# Patient Record
Sex: Female | Born: 1950
Health system: Southern US, Community
[De-identification: ages and names within clinical notes are randomized; demographics above are authoritative.]

## PROBLEM LIST (undated history)

## (undated) DIAGNOSIS — Z8041 Family history of malignant neoplasm of ovary: Secondary | ICD-10-CM

## (undated) DIAGNOSIS — C801 Malignant (primary) neoplasm, unspecified: Secondary | ICD-10-CM

## (undated) DIAGNOSIS — C439 Malignant melanoma of skin, unspecified: Secondary | ICD-10-CM

## (undated) DIAGNOSIS — C4490 Unspecified malignant neoplasm of skin, unspecified: Secondary | ICD-10-CM

## (undated) DIAGNOSIS — J45909 Unspecified asthma, uncomplicated: Secondary | ICD-10-CM

## (undated) DIAGNOSIS — J9819 Other pulmonary collapse: Secondary | ICD-10-CM

## (undated) DIAGNOSIS — M1990 Unspecified osteoarthritis, unspecified site: Secondary | ICD-10-CM

## (undated) DIAGNOSIS — Z808 Family history of malignant neoplasm of other organs or systems: Secondary | ICD-10-CM

## (undated) DIAGNOSIS — R112 Nausea with vomiting, unspecified: Secondary | ICD-10-CM

## (undated) DIAGNOSIS — Z85820 Personal history of malignant melanoma of skin: Secondary | ICD-10-CM

## (undated) DIAGNOSIS — Z803 Family history of malignant neoplasm of breast: Secondary | ICD-10-CM

## (undated) DIAGNOSIS — Z9889 Other specified postprocedural states: Secondary | ICD-10-CM

## (undated) DIAGNOSIS — C50919 Malignant neoplasm of unspecified site of unspecified female breast: Secondary | ICD-10-CM

## (undated) DIAGNOSIS — T7840XA Allergy, unspecified, initial encounter: Secondary | ICD-10-CM

## (undated) HISTORY — DX: Family history of malignant neoplasm of breast: Z80.3

## (undated) HISTORY — DX: Family history of malignant neoplasm of other organs or systems: Z80.8

## (undated) HISTORY — DX: Family history of malignant neoplasm of ovary: Z80.41

## (undated) HISTORY — DX: Malignant (primary) neoplasm, unspecified: C80.1

## (undated) HISTORY — DX: Unspecified malignant neoplasm of skin, unspecified: C44.90

## (undated) HISTORY — PX: TOTAL HIP ARTHROPLASTY: SHX124

## (undated) HISTORY — DX: Malignant melanoma of skin, unspecified: C43.9

## (undated) HISTORY — PX: JOINT REPLACEMENT: SHX530

## (undated) HISTORY — DX: Personal history of malignant melanoma of skin: Z85.820

## (undated) HISTORY — PX: BREAST SURGERY: SHX581

## (undated) HISTORY — DX: Other pulmonary collapse: J98.19

## (undated) HISTORY — DX: Malignant neoplasm of unspecified site of unspecified female breast: C50.919

## (undated) HISTORY — DX: Allergy, unspecified, initial encounter: T78.40XA

## (undated) HISTORY — DX: Unspecified osteoarthritis, unspecified site: M19.90

## (undated) HISTORY — PX: TONSILLECTOMY: SUR1361

---

## 2010-11-04 ENCOUNTER — Other Ambulatory Visit: Payer: Self-pay | Admitting: Family Medicine

## 2010-11-04 DIAGNOSIS — Z1231 Encounter for screening mammogram for malignant neoplasm of breast: Secondary | ICD-10-CM

## 2010-11-12 ENCOUNTER — Ambulatory Visit: Payer: Self-pay

## 2010-11-17 ENCOUNTER — Ambulatory Visit
Admission: RE | Admit: 2010-11-17 | Discharge: 2010-11-17 | Disposition: A | Discharge status: Home / Self Care | Payer: PRIVATE HEALTH INSURANCE | Source: Ambulatory Visit | Attending: Family Medicine | Admitting: Family Medicine

## 2010-11-17 DIAGNOSIS — Z1231 Encounter for screening mammogram for malignant neoplasm of breast: Secondary | ICD-10-CM

## 2011-11-25 ENCOUNTER — Other Ambulatory Visit: Payer: Self-pay | Admitting: Family Medicine

## 2011-11-25 DIAGNOSIS — Z1231 Encounter for screening mammogram for malignant neoplasm of breast: Secondary | ICD-10-CM

## 2011-12-01 ENCOUNTER — Ambulatory Visit
Admission: RE | Admit: 2011-12-01 | Discharge: 2011-12-01 | Disposition: A | Discharge status: Home / Self Care | Payer: BC Managed Care – PPO | Source: Ambulatory Visit | Attending: Family Medicine | Admitting: Family Medicine

## 2011-12-01 DIAGNOSIS — Z1231 Encounter for screening mammogram for malignant neoplasm of breast: Secondary | ICD-10-CM

## 2011-12-02 ENCOUNTER — Other Ambulatory Visit: Payer: Self-pay | Admitting: Family Medicine

## 2011-12-02 DIAGNOSIS — R928 Other abnormal and inconclusive findings on diagnostic imaging of breast: Secondary | ICD-10-CM

## 2011-12-07 ENCOUNTER — Ambulatory Visit
Admission: RE | Admit: 2011-12-07 | Discharge: 2011-12-07 | Disposition: A | Discharge status: Home / Self Care | Payer: BC Managed Care – PPO | Source: Ambulatory Visit | Attending: Family Medicine | Admitting: Family Medicine

## 2011-12-07 DIAGNOSIS — R928 Other abnormal and inconclusive findings on diagnostic imaging of breast: Secondary | ICD-10-CM

## 2012-01-15 ENCOUNTER — Ambulatory Visit
Admission: RE | Admit: 2012-01-15 | Discharge: 2012-01-15 | Disposition: A | Discharge status: Home / Self Care | Payer: BC Managed Care – PPO | Source: Ambulatory Visit | Attending: Physician Assistant | Admitting: Physician Assistant

## 2012-01-15 ENCOUNTER — Other Ambulatory Visit: Payer: Self-pay | Admitting: Physician Assistant

## 2012-01-15 DIAGNOSIS — R05 Cough: Secondary | ICD-10-CM

## 2012-01-15 DIAGNOSIS — R053 Chronic cough: Secondary | ICD-10-CM

## 2012-01-18 ENCOUNTER — Other Ambulatory Visit: Payer: Self-pay | Admitting: Physician Assistant

## 2012-01-18 DIAGNOSIS — R9389 Abnormal findings on diagnostic imaging of other specified body structures: Secondary | ICD-10-CM

## 2012-01-19 ENCOUNTER — Ambulatory Visit
Admission: RE | Admit: 2012-01-19 | Discharge: 2012-01-19 | Disposition: A | Discharge status: Home / Self Care | Payer: BC Managed Care – PPO | Source: Ambulatory Visit | Attending: Physician Assistant | Admitting: Physician Assistant

## 2012-01-19 DIAGNOSIS — R9389 Abnormal findings on diagnostic imaging of other specified body structures: Secondary | ICD-10-CM

## 2012-02-26 ENCOUNTER — Encounter: Payer: Self-pay | Admitting: Internal Medicine

## 2012-02-26 ENCOUNTER — Ambulatory Visit (INDEPENDENT_AMBULATORY_CARE_PROVIDER_SITE_OTHER): Payer: BC Managed Care – PPO | Admitting: Internal Medicine

## 2012-02-26 VITALS — BP 118/76 | HR 64 | Temp 97.7°F | Ht 63.75 in | Wt 144.2 lb

## 2012-02-26 DIAGNOSIS — R05 Cough: Secondary | ICD-10-CM

## 2012-02-26 DIAGNOSIS — R059 Cough, unspecified: Secondary | ICD-10-CM

## 2012-02-26 MED ORDER — LEVOFLOXACIN 750 MG PO TABS: 750.0000 mg | ORAL_TABLET | Freq: Every day | ORAL | Status: AC

## 2012-02-26 NOTE — Progress Notes (Signed)
  Subjective:    Patient ID: Catherine Long, female    DOB: 14-Jul-1951 MRN: [id number]  HPI  60 yowf no significant smoking hx with tendency to cough esp doing yardwork since around 2007 assoc with sinus complaints then chronic cough developed in 2012 referred 02/26/2012 to pulmonary clinic for ? MAI   02/26/2012 1st pulmonary ov cc now  daily cough x one year intermittently green mod thick but no more than a few tsp no assoc sob or sinus complaints or hb despite fish oil x 10 years. Cough is worse first thing in am but doesn't wake her. m Also denies any obvious fluctuation of symptoms with weather or environmental changes or other aggravating or alleviating factors except as outlined above.  Review of Systems  Constitutional: Negative for fever, chills and unexpected weight change.  HENT: Negative for ear pain, nosebleeds, congestion, sore throat, rhinorrhea, sneezing, trouble swallowing, dental problem, voice change, postnasal drip and sinus pressure.   Eyes: Negative for visual disturbance.  Respiratory: Positive for cough. Negative for choking and shortness of breath.   Cardiovascular: Negative for chest pain and leg swelling.  Gastrointestinal: Negative for vomiting, abdominal pain and diarrhea.  Genitourinary: Negative for difficulty urinating.  Musculoskeletal: Negative for arthralgias.  Skin: Negative for rash.  Neurological: Negative for tremors, syncope and headaches.  Hematological: Does not bruise/bleed easily.       Objective:   Physical Exam Amb wf  Wt 144 02/26/2012   HEENT: nl dentition, turbinates, and orophanx. Nl external ear canals without cough reflex   NECK :  without JVD/Nodes/TM/ nl carotid upstrokes bilaterally   LUNGS: no acc muscle use, clear to A and P bilaterally without cough on insp or exp maneuvers   CV:  RRR  no s3 or murmur or increase in P2, no edema   ABD:  soft and nontender with nl excursion in the supine position. No bruits or  organomegaly, bowel sounds nl  MS:  warm without deformities, calf tenderness, cyanosis or clubbing  SKIN: warm and dry without lesions    NEURO:  alert, approp, no deficits     Ct chest 01/19/12 1. The opacities noted on chest x-ray correlate with areas of  scarring and peribronchovascular nodularity. These findings are  highly suggestive of an atypical infection such as Mycobacterium  avium.     Assessment & Plan:

## 2012-02-26 NOTE — Patient Instructions (Addendum)
If mucus turns really green and nasty best choice is levaquin 750 mg one daily x 5 days  Condition is called MAI   GERD (REFLUX)  is an extremely common cause of respiratory symptoms, many times with no significant heartburn at all.    It can be treated with medication, but also with lifestyle changes including avoidance of late meals, excessive alcohol, smoking cessation, and avoid fatty foods, chocolate, peppermint, colas, red wine, and acidic juices such as orange juice.  NO MINT OR MENTHOL PRODUCTS SO NO COUGH DROPS  USE SUGARLESS CANDY INSTEAD (jolley ranchers or Stover's)  NO OIL BASED VITAMINS - use powdered substitutes.  Please schedule a follow up office visit in 8 weeks, call sooner if needed with cxr and pft's on return and bring 2006

## 2012-02-27 NOTE — Assessment & Plan Note (Signed)
-   CT 01/19/12 suggestive of MAI s bronchiectasis  Her cough is relatively mild now and occasionally purulent typical of low grade MAI that at this point is really not severe enough to warrant bronch/ cultures because the only way to treat it effectively is 2-3 years of 2-3 drugs that aren't well tolerated or universally effective  Discussed in detail all the  indications, usual  risks and alternatives  relative to the benefits with patient who agrees to proceed with conservative rx with serial cxr f/u and pft's for baseline purposes  In the meantime reviewed guidelines for chronic cough including avoiding meds/foods  that are assoc with gerd and levaquin x5 days in the event of exacerbation - See instructions for specific recommendations which were reviewed directly with the patient who was given a copy with highlighter outlining the key components.

## 2012-03-01 ENCOUNTER — Institutional Professional Consult (permissible substitution): Payer: BC Managed Care – PPO | Admitting: Internal Medicine

## 2012-04-25 ENCOUNTER — Ambulatory Visit: Payer: BC Managed Care – PPO | Admitting: Internal Medicine

## 2012-04-26 ENCOUNTER — Ambulatory Visit (INDEPENDENT_AMBULATORY_CARE_PROVIDER_SITE_OTHER): Payer: BC Managed Care – PPO | Admitting: Internal Medicine

## 2012-04-26 ENCOUNTER — Encounter: Payer: Self-pay | Admitting: Internal Medicine

## 2012-04-26 ENCOUNTER — Ambulatory Visit (INDEPENDENT_AMBULATORY_CARE_PROVIDER_SITE_OTHER)
Admission: RE | Admit: 2012-04-26 | Discharge: 2012-04-26 | Disposition: A | Discharge status: Home / Self Care | Payer: BC Managed Care – PPO | Source: Ambulatory Visit | Attending: Internal Medicine | Admitting: Internal Medicine

## 2012-04-26 VITALS — BP 100/62 | HR 70 | Temp 97.8°F | Ht 64.0 in | Wt 142.0 lb

## 2012-04-26 DIAGNOSIS — J45909 Unspecified asthma, uncomplicated: Secondary | ICD-10-CM

## 2012-04-26 DIAGNOSIS — J9819 Other pulmonary collapse: Secondary | ICD-10-CM

## 2012-04-26 DIAGNOSIS — R05 Cough: Secondary | ICD-10-CM

## 2012-04-26 DIAGNOSIS — R059 Cough, unspecified: Secondary | ICD-10-CM

## 2012-04-26 DIAGNOSIS — Z23 Encounter for immunization: Secondary | ICD-10-CM

## 2012-04-26 LAB — PULMONARY FUNCTION TEST

## 2012-04-26 MED ORDER — MOMETASONE FURO-FORMOTEROL FUM 100-5 MCG/ACT IN AERO: 2.0000 | INHALATION_SPRAY | Freq: Two times a day (BID) | RESPIRATORY_TRACT | Status: DC

## 2012-04-26 NOTE — Progress Notes (Signed)
  Subjective:    Patient ID: Catherine Long, female    DOB: 1951-05-31   MRN: [id number]  HPI  34 yowf no significant smoking hx with tendency to cough esp doing yardwork since around 2007 assoc with sinus complaints then chronic cough developed in 2012 referred 02/26/2012 to pulmonary clinic for ? MAI   02/26/2012 1st pulmonary ov cc now  daily cough x one year intermittently green mod thick but no more than a few tsp no assoc sob or sinus complaints or hb despite fish oil x 10 years. Cough is worse first thing in am but doesn't wake her.  rec  If mucus turns really green and nasty best choice is levaquin 750 mg one daily x 5 days Condition is called MAI  GERD diet.   04/26/2012 f/u ov/Cristen Bredeson cc cough gone to her satisfaction min in am's min discolored, no sob. Did not take levaquin, not using mucinex.  Sleeping ok without nocturnal  or early am exacerbation  of respiratory  c/o's or need for noct saba. Also denies any obvious fluctuation of symptoms with weather or environmental changes or other aggravating or alleviating factors except as outlined above  ROS  The following are not active complaints unless bolded sore throat, dysphagia, dental problems, itching, sneezing,  nasal congestion or excess/ purulent secretions, ear ache,   fever, chills, sweats, unintended wt loss, pleuritic or exertional cp, hemoptysis,  orthopnea pnd or leg swelling, presyncope, palpitations, heartburn, abdominal pain, anorexia, nausea, vomiting, diarrhea  or change in bowel or urinary habits, change in stools or urine, dysuria,hematuria,  rash, arthralgias, visual complaints, headache, numbness weakness or ataxia or problems with walking or coordination,  change in mood/affect or memory.          Objective:   Physical Exam Amb wf  Wt 144 02/26/2012 > 04/26/2012  142  HEENT: nl dentition, turbinates, and orophanx. Nl external ear canals without cough reflex   NECK :  without JVD/Nodes/TM/ nl carotid upstrokes  bilaterally   LUNGS: no acc muscle use, clear to A and P bilaterally without cough on insp or exp maneuvers   CV:  RRR  no s3 or murmur or increase in P2, no edema   ABD:  soft and nontender with nl excursion in the supine position. No bruits or organomegaly, bowel sounds nl  MS:  warm without deformities, calf tenderness, cyanosis or clubbing  SKIN: warm and dry without lesions    NEURO:  alert, approp, no deficits     CXR  04/26/2012 : There is worsening streaky airspace disease in the right middle lobe best seen in lateral view.     Assessment & Plan:

## 2012-04-26 NOTE — Patient Instructions (Addendum)
Start dulera 100 Take 2 puffs first thing in am and then another 2 puffs about 12 hours later.   Work on inhaler technique:  relax and gently blow all the way out then take a nice smooth deep breath back in, triggering the inhaler at same time you start breathing in.  Hold for up to 5 seconds if you can.  Rinse and gargle with water when done   If your mouth or throat starts to bother you,   I suggest you time the inhaler to your dental care and after using the inhaler(s) brush teeth and tongue with a baking soda containing toothpaste and when you rinse this out, gargle with it first to see if this helps your mouth and throat.     Please schedule a follow up office visit in 6 weeks, call sooner if needed for follow up cxr  Pneumovax and influenza given  Late add    - Alpha One AT/IgE testing rec but not done 04/26/2012

## 2012-04-26 NOTE — Assessment & Plan Note (Signed)
-   CT 01/19/12 suggestive of MAI s bronchiectasis  She does have levaquin to use if mucus gets more purulent, reviewed need to try to improve mc fx as much as possible by using dulera 100 2bid

## 2012-04-26 NOTE — Progress Notes (Signed)
PFT done today. 

## 2012-04-26 NOTE — Assessment & Plan Note (Signed)
-   PFTs 04/26/2012  FEV1  1.68 (75%) ratio 53 and no better p B2,  DLC0 92%  She has clear evidence of chronic airflow obstruction moderate severity and chronic cough with prob RML syndrome or ABPA in ddx >  Needs trial of dulera 100 Take 2 puffs first thing in am and then another 2 puffs about 12 hours and return for f/u in 6 weeks to check IgE and alpha One genotype on return

## 2012-06-07 ENCOUNTER — Ambulatory Visit: Payer: BC Managed Care – PPO | Admitting: Internal Medicine

## 2012-06-13 ENCOUNTER — Other Ambulatory Visit: Payer: Self-pay | Admitting: Internal Medicine

## 2012-06-13 DIAGNOSIS — J9819 Other pulmonary collapse: Secondary | ICD-10-CM

## 2012-06-14 ENCOUNTER — Ambulatory Visit (INDEPENDENT_AMBULATORY_CARE_PROVIDER_SITE_OTHER)
Admission: RE | Admit: 2012-06-14 | Discharge: 2012-06-14 | Disposition: A | Discharge status: Home / Self Care | Payer: BC Managed Care – PPO | Source: Ambulatory Visit | Attending: Internal Medicine | Admitting: Internal Medicine

## 2012-06-14 ENCOUNTER — Ambulatory Visit (INDEPENDENT_AMBULATORY_CARE_PROVIDER_SITE_OTHER): Payer: BC Managed Care – PPO | Admitting: Internal Medicine

## 2012-06-14 ENCOUNTER — Encounter: Payer: Self-pay | Admitting: Internal Medicine

## 2012-06-14 ENCOUNTER — Other Ambulatory Visit (INDEPENDENT_AMBULATORY_CARE_PROVIDER_SITE_OTHER): Payer: BC Managed Care – PPO

## 2012-06-14 VITALS — BP 102/70 | HR 64 | Temp 97.6°F | Ht 64.0 in | Wt 147.2 lb

## 2012-06-14 DIAGNOSIS — J9819 Other pulmonary collapse: Secondary | ICD-10-CM

## 2012-06-14 DIAGNOSIS — J45909 Unspecified asthma, uncomplicated: Secondary | ICD-10-CM

## 2012-06-14 LAB — CBC WITH DIFFERENTIAL/PLATELET
Basophils Absolute: 0.1 10*3/uL (ref 0.0–0.1)
Basophils Relative: 0.8 % (ref 0.0–3.0)
Eosinophils Absolute: 0.1 10*3/uL (ref 0.0–0.7)
Eosinophils Relative: 0.9 % (ref 0.0–5.0)
HCT: 39.2 % (ref 36.0–46.0)
Hemoglobin: 13 g/dL (ref 12.0–15.0)
Lymphocytes Relative: 24.9 % (ref 12.0–46.0)
Lymphs Abs: 1.7 10*3/uL (ref 0.7–4.0)
MCHC: 33.2 g/dL (ref 30.0–36.0)
MCV: 89.4 fl (ref 78.0–100.0)
Monocytes Absolute: 0.4 10*3/uL (ref 0.1–1.0)
Monocytes Relative: 5.8 % (ref 3.0–12.0)
Neutro Abs: 4.7 10*3/uL (ref 1.4–7.7)
Neutrophils Relative %: 67.6 % (ref 43.0–77.0)
Platelets: 309 10*3/uL (ref 150.0–400.0)
RBC: 4.39 Mil/uL (ref 3.87–5.11)
RDW: 14.3 % (ref 11.5–14.6)
WBC: 7 10*3/uL (ref 4.5–10.5)

## 2012-06-14 MED ORDER — LEVOFLOXACIN 750 MG PO TABS: 750.0000 mg | ORAL_TABLET | Freq: Every day | ORAL | Status: DC

## 2012-06-14 NOTE — Assessment & Plan Note (Signed)
-   CT 01/19/12 suggestive of MAI s bronchiectasis     - Pneumovax given 04/26/2012      - Allergy profile 06/14/2012 >>     - Alpha one AT 06/14/2012 >>>  W/u in progress, in meantime rx asthma and flares of purulent sputum with levquin x 5 days, reviewed

## 2012-06-14 NOTE — Progress Notes (Signed)
  Subjective:    Patient ID: Catherine Long, female    DOB: [birth year]   MRN: [id number]  HPI  12 yowf no significant smoking hx with tendency to cough esp doing yardwork since around 2007 assoc with sinus complaints then chronic cough developed in 2012 referred 02/26/2012 to pulmonary clinic for ? MAI   02/26/2012 1st pulmonary ov cc now  daily cough x one year intermittently green mod thick but no more than a few tsp no assoc sob or sinus complaints or hb despite fish oil x 10 years. Cough is worse first thing in am but doesn't wake her.  rec  If mucus turns really green and nasty best choice is levaquin 750 mg one daily x 5 days GERD diet.   04/26/2012 f/u ov/Catherine Long cc cough gone to her satisfaction min in am's min discolored, no sob. Did not take levaquin, not using mucinex. rec Start dulera 100 Take 2 puffs first thing in am and then another 2 puffs about 12 hours later.  Work on inhaler technique:   Pneumovax and influenza given Late add    - Alpha One AT/IgE testing rec but not done 04/26/2012  06/14/2012 f/u ov/Catherine Long cc some increase green sputum x weeks no sob.  Has not used levaquin but does feel much better with dulera, less tight, no sob with adls.  Sleeping ok without nocturnal  or early am exacerbation  of respiratory  c/o's or need for noct saba. Also denies any obvious fluctuation of symptoms with weather or environmental changes or other aggravating or alleviating factors except as outlined above  ROS  The following are not active complaints unless bolded sore throat, dysphagia, dental problems, itching, sneezing,  nasal congestion or excess/ purulent secretions, ear ache,   fever, chills, sweats, unintended wt loss, pleuritic or exertional cp, hemoptysis,  orthopnea pnd or leg swelling, presyncope, palpitations, heartburn, abdominal pain, anorexia, nausea, vomiting, diarrhea  or change in bowel or urinary habits, change in stools or urine, dysuria,hematuria,  rash, arthralgias,  visual complaints, headache, numbness weakness or ataxia or problems with walking or coordination,  change in mood/affect or memory.          Objective:   Physical Exam Amb wf  Wt 144 02/26/2012 > 04/26/2012  142 > 06/14/2012 147  HEENT: nl dentition, turbinates, and orophanx. Nl external ear canals without cough reflex   NECK :  without JVD/Nodes/TM/ nl carotid upstrokes bilaterally   LUNGS: no acc muscle use, clear to A and P bilaterally without cough on insp or exp maneuvers   CV:  RRR  no s3 or murmur or increase in P2, no edema   ABD:  soft and nontender with nl excursion in the supine position. No bruits or organomegaly, bowel sounds nl  MS:  warm without deformities, calf tenderness, cyanosis or clubbing  SKIN: warm and dry without lesions        CXR  06/14/2012 : Emphysematous bronchitic changes. Retrosternal density appears improved; recommend continued radiographic follow-up.         Assessment & Plan:

## 2012-06-14 NOTE — Assessment & Plan Note (Addendum)
-   PFTs 04/26/2012  FEV1  1.68 (75%) ratio 53 and no better p B2,  DLC0 92%  ddx includes abpa and alpha one AT  > studies sent today  Doing better on dulera in low doses despite poor mdi.  The proper method of use, as well as anticipated side effects, of a metered-dose inhaler are discussed and demonstrated to the patient. Improved effectiveness after extensive coaching during this visit to a level of approximately  75%

## 2012-06-14 NOTE — Patient Instructions (Addendum)
Please remember to go to the lab  department downstairs for your tests - we will call you with the results when they are available.  Levaquin 750 x 5 days for nasty mucus  For thick mucus > mucinex or mucinex dm   Please schedule a follow up visit in 3 months but call sooner if needed

## 2012-06-15 LAB — ALPHA-1-ANTITRYPSIN: A-1 Antitrypsin, Ser: 144 mg/dL (ref 90–200)

## 2012-06-16 LAB — ALLERGY PROFILE REGION II-DC, DE, MD, ~~LOC~~, VA
Allergen, D pternoyssinus,d7: <0.1 kU/L
Alternaria Alternata: <0.1 kU/L
Aspergillus fumigatus, IgG: <0.1 kU/L
Bermuda Grass: <0.1 kU/L
Box Elder IgE: <0.1 kU/L
Cat Dander: <0.1 kU/L
Cladosporium Herbarum: <0.1 kU/L
Cockroach: <0.1 kU/L
Common Ragweed: <0.1 kU/L
D. farinae: <0.1 kU/L
Dog Dander: <0.1 kU/L
Elm IgE: <0.1 kU/L
IgE (Immunoglobulin E), Serum: <1.5 IU/mL (ref 0.0–180.0)
Johnson Grass: <0.1 kU/L
Lamb's Quarters: <0.1 kU/L
Meadow Grass: <0.1 kU/L
Oak: <0.1 kU/L
Pecan/Hickory Tree IgE: <0.1 kU/L

## 2012-06-16 NOTE — Progress Notes (Signed)
Quick Note:  Spoke with pt and notified of results per Dr. Wert. Pt verbalized understanding and denied any questions.  ______ 

## 2012-06-21 LAB — ALPHA-1 ANTITRYPSIN PHENOTYPE: A-1 Antitrypsin: 152 mg/dL (ref 83–199)

## 2012-09-13 ENCOUNTER — Ambulatory Visit: Payer: BC Managed Care – PPO | Admitting: Internal Medicine

## 2012-09-15 ENCOUNTER — Ambulatory Visit: Payer: BC Managed Care – PPO | Admitting: Internal Medicine

## 2012-10-06 ENCOUNTER — Ambulatory Visit (INDEPENDENT_AMBULATORY_CARE_PROVIDER_SITE_OTHER): Payer: BC Managed Care – PPO | Admitting: Internal Medicine

## 2012-10-06 ENCOUNTER — Ambulatory Visit (INDEPENDENT_AMBULATORY_CARE_PROVIDER_SITE_OTHER)
Admission: RE | Admit: 2012-10-06 | Discharge: 2012-10-06 | Disposition: A | Discharge status: Home / Self Care | Payer: BC Managed Care – PPO | Source: Ambulatory Visit | Attending: Internal Medicine | Admitting: Internal Medicine

## 2012-10-06 ENCOUNTER — Encounter: Payer: Self-pay | Admitting: Internal Medicine

## 2012-10-06 VITALS — BP 104/64 | HR 94 | Temp 98.1°F | Ht 64.0 in | Wt 150.0 lb

## 2012-10-06 DIAGNOSIS — J9819 Other pulmonary collapse: Secondary | ICD-10-CM

## 2012-10-06 NOTE — Patient Instructions (Addendum)
Please remember to go to the lab and x-ray department downstairs for your tests - we will call you with the results when they are available.  Levaquin 750 x 5 days for nasty mucus  For thick mucus > mucinex or mucinex dm      Please schedule a follow up visit in 6 months but call sooner if needed

## 2012-10-06 NOTE — Progress Notes (Signed)
Subjective:    Patient ID: Catherine Long, female    DOB: 06-01-1951   MRN: [id number]   Brief patient profile:  61 yowf no significant smoking hx with tendency to cough esp doing yardwork since around 2007 assoc with sinus complaints then chronic cough developed in 2012 referred 02/26/2012 to pulmonary clinic for ? MAI   02/26/2012 1st pulmonary ov cc now  daily cough x one year intermittently green mod thick but no more than a few tsp no assoc sob or sinus complaints or hb despite fish oil x 10 years. Cough is worse first thing in am but doesn't wake her.  rec  If mucus turns really green and nasty best choice is levaquin 750 mg one daily x 5 days GERD diet.   04/26/2012 f/u ov/Wert cc cough gone to her satisfaction min in am's min discolored, no sob. Did not take levaquin, not using mucinex. rec Start dulera 100 Take 2 puffs first thing in am and then another 2 puffs about 12 hours later.  Work on inhaler technique:   Pneumovax and influenza given Late add    - Alpha One AT/IgE testing rec but not done 04/26/2012  06/14/2012 f/u ov/Wert cc some increase green sputum x weeks no sob.  Has not used levaquin but does feel much better with dulera, less tight, no sob with adls. rec Please remember to go to the lab  department downstairs for your tests - we will call you with the results when they are available. Levaquin 750 x 5 days for nasty mucus For thick mucus > mucinex or mucinex dm   10/06/2012 f/u ov/Wert cc felt so good stoppeddulera and has not needed any levaquin cycles with no sob. No obvious daytime variabilty or assoc chronic cough or cp or chest tightness, subjective wheeze overt sinus or hb symptoms. No unusual exp hx or h/o childhood pna/ asthma or premature birth to her knowledge.   Sleeping ok without nocturnal  or early am exacerbation  of respiratory  c/o's or need for noct saba. Also denies any obvious fluctuation of symptoms with weather or environmental changes or other  aggravating or alleviating factors except as outlined above  ROS  The following are not active complaints unless bolded sore throat, dysphagia, dental problems, itching, sneezing,  nasal congestion or excess/ purulent secretions, ear ache,   fever, chills, sweats, unintended wt loss, pleuritic or exertional cp, hemoptysis,  orthopnea pnd or leg swelling, presyncope, palpitations, heartburn, abdominal pain, anorexia, nausea, vomiting, diarrhea  or change in bowel or urinary habits, change in stools or urine, dysuria,hematuria,  rash, arthralgias, visual complaints, headache, numbness weakness or ataxia or problems with walking or coordination,  change in mood/affect or memory.          Objective:   Physical Exam Amb wf  Wt 144 02/26/2012 > 04/26/2012  142 > 06/14/2012 147 > 10/06/2012  150  HEENT: nl dentition, turbinates, and orophanx. Nl external ear canals without cough reflex   NECK :  without JVD/Nodes/TM/ nl carotid upstrokes bilaterally   LUNGS: no acc muscle use, clear to A and P bilaterally without cough on insp or exp maneuvers   CV:  RRR  no s3 or murmur or increase in P2, no edema   ABD:  soft and nontender with nl excursion in the supine position. No bruits or organomegaly, bowel sounds nl  MS:  warm without deformities, calf tenderness, cyanosis or clubbing  SKIN: warm and dry without lesions  CXR  10/06/2012 :    COPD. Right middle lobe scarring with possible superimposed right  middle lobe pneumonia.         Assessment & Plan:

## 2012-10-06 NOTE — Assessment & Plan Note (Addendum)
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  Please see instructions for details which were reviewed in writing and the patient given a copy.

## 2012-10-11 NOTE — Progress Notes (Signed)
Quick Note:  Spoke with pt and notified of results per Dr. Wert. Pt verbalized understanding and denied any questions.  ______ 

## 2012-12-19 ENCOUNTER — Other Ambulatory Visit: Payer: Self-pay

## 2012-12-19 DIAGNOSIS — Z1231 Encounter for screening mammogram for malignant neoplasm of breast: Secondary | ICD-10-CM

## 2013-01-06 ENCOUNTER — Ambulatory Visit
Admission: RE | Admit: 2013-01-06 | Discharge: 2013-01-06 | Disposition: A | Discharge status: Home / Self Care | Payer: BC Managed Care – PPO | Source: Ambulatory Visit

## 2013-01-06 DIAGNOSIS — Z1231 Encounter for screening mammogram for malignant neoplasm of breast: Secondary | ICD-10-CM

## 2013-03-31 ENCOUNTER — Telehealth: Payer: Self-pay | Admitting: Internal Medicine

## 2013-03-31 NOTE — Telephone Encounter (Signed)
left messages to schedule follow up apt. no return call back. sent letter 03/31/13 °

## 2013-06-01 ENCOUNTER — Ambulatory Visit (INDEPENDENT_AMBULATORY_CARE_PROVIDER_SITE_OTHER): Payer: BC Managed Care – PPO | Admitting: Internal Medicine

## 2013-06-01 ENCOUNTER — Encounter: Payer: Self-pay | Admitting: Internal Medicine

## 2013-06-01 VITALS — BP 118/62 | HR 60 | Temp 97.6°F | Ht 64.0 in | Wt 149.4 lb

## 2013-06-01 DIAGNOSIS — Z23 Encounter for immunization: Secondary | ICD-10-CM

## 2013-06-01 DIAGNOSIS — J45909 Unspecified asthma, uncomplicated: Secondary | ICD-10-CM

## 2013-06-01 DIAGNOSIS — J9819 Other pulmonary collapse: Secondary | ICD-10-CM

## 2013-06-01 NOTE — Assessment & Plan Note (Signed)
-   PFTs 04/26/2012  FEV1  1.68 (75%) ratio 53 and no better p B2,  DLC0 92%    - Spirometry 06/01/2013  FEV1  1.81(76%) ratio 70 and no dulera since March 2014    - HFA 75% 06/14/2012   Clearly her chronic airflow obst/ saba need has resolved so reviewed her options which at this point should just include prn inhalers, preferably saba using the rule of 2s, but since she already has dulera that will do as a back up for now  No further pulmonary f/u needed

## 2013-06-01 NOTE — Patient Instructions (Addendum)
Dulera 100  is used 2 puffs every 12 hours only  if having any symptoms of cough/ wheeze/ short of breath  In the future, when your dulera runs out, all I recommend is proaire up to 2 puffs four times a day as needed  - if needing either the dulera or the proaire more than twice a week we need to see you    If you are satisfied with your treatment plan let your doctor know and he/she can either refill your medications or you can return here when your prescription runs out.     If in any way you are not 100% satisfied,  please tell us.  If 100% better, tell your friends!   Pulmonary follow up is as needed

## 2013-06-01 NOTE — Assessment & Plan Note (Signed)
-   CT 01/19/12 suggestive of MAI s bronchiectasis     - Pneumovax given 04/26/2012      - Allergy profile 06/14/2012 >> neg     - Alpha one AT 06/14/2012 >  MM  No further pulmonary f/u needed in this setting  Flu shot given.  Would give prevar at age 62 instead of second pneumovax

## 2013-06-01 NOTE — Progress Notes (Signed)
Subjective:    Patient ID: Catherine Long, female    DOB: 1951-07-26   MRN: [id number]   Brief patient profile:  62 yowf no significant smoking hx with tendency to cough esp doing yardwork since around 2007 assoc with sinus complaints then chronic cough developed in 2012 referred 02/26/2012 to pulmonary clinic 02/26/12 for ? MAI    History of Present Illness   02/26/2012 1st pulmonary ov cc now  daily cough x one year intermittently green mod thick but no more than a few tsp no assoc sob or sinus complaints or hb despite fish oil x 10 years. Cough is worse first thing in am but doesn't wake her.  rec  If mucus turns really green and nasty best choice is levaquin 750 mg one daily x 5 days GERD diet.   04/26/2012 f/u ov/Catherine Long cc cough gone to her satisfaction min in am's min discolored, no sob. Did not take levaquin, not using mucinex. rec Start dulera 100 Take 2 puffs first thing in am and then another 2 puffs about 12 hours later.  Work on inhaler technique:   Pneumovax and influenza given Late add    - Alpha One AT/IgE testing rec but not done 04/26/2012  06/14/2012 f/u ov/Catherine Long cc some increase green sputum x weeks no sob.  Has not used levaquin but does feel much better with dulera, less tight, no sob with adls. rec Please remember to go to the lab  department downstairs for your tests - we will call you with the results when they are available. Levaquin 750 x 5 days for nasty mucus For thick mucus > mucinex or mucinex dm   10/06/2012 f/u ov/Catherine Long cc felt so good stopped dulera and has not needed any levaquin cycles with no sob.  rec No change rx   06/01/2013 f/u ov/Catherine Long re: ? Chronic asthma/ rml syndrome Chief Complaint  Patient presents with  . Follow-up    6 mth f/u - Denies cough , sob, wheeze or chest discomfort - Wants flu shot   no meds at all x 6 months, not limited from desired activities but no longer doing yard work   No obvious day to day or daytime variabilty or  assoc chronic cough or cp or chest tightness, subjective wheeze overt sinus or hb symptoms. No unusual exp hx or h/o childhood pna/ asthma or knowledge of premature birth.  Sleeping ok without nocturnal  or early am exacerbation  of respiratory  c/o's or need for noct saba. Also denies any obvious fluctuation of symptoms with weather or environmental changes or other aggravating or alleviating factors except as outlined above   Current Medications, Allergies, Complete Past Medical History, Past Surgical History, Family History, and Social History were reviewed in [employer] record.  ROS  The following are not active complaints unless bolded sore throat, dysphagia, dental problems, itching, sneezing,  nasal congestion or excess/ purulent secretions, ear ache,   fever, chills, sweats, unintended wt loss, pleuritic or exertional cp, hemoptysis,  orthopnea pnd or leg swelling, presyncope, palpitations, heartburn, abdominal pain, anorexia, nausea, vomiting, diarrhea  or change in bowel or urinary habits, change in stools or urine, dysuria,hematuria,  rash, arthralgias, visual complaints, headache, numbness weakness or ataxia or problems with walking or coordination,  change in mood/affect or memory.              Objective:   Physical Exam Amb wf  Wt 144 02/26/2012 > 04/26/2012  142 > 06/14/2012 147 > 10/06/2012  150 >  06/01/2013  149   HEENT: nl dentition, turbinates, and orophanx. Nl external ear canals without cough reflex   NECK :  without JVD/Nodes/TM/ nl carotid upstrokes bilaterally   LUNGS: no acc muscle use, clear to A and P bilaterally without cough on insp or exp maneuvers   CV:  RRR  no s3 or murmur or increase in P2, no edema   ABD:  soft and nontender with nl excursion in the supine position. No bruits or organomegaly, bowel sounds nl  MS:  warm without deformities, calf tenderness, cyanosis or clubbing  SKIN: warm and dry without lesions        CXR   10/06/2012 :    COPD. Right middle lobe scarring with possible superimposed right  middle lobe pneumonia. My review: c/w RML syndrome,          Assessment & Plan:

## 2014-01-29 ENCOUNTER — Other Ambulatory Visit: Payer: Self-pay

## 2014-01-29 DIAGNOSIS — Z1231 Encounter for screening mammogram for malignant neoplasm of breast: Secondary | ICD-10-CM

## 2014-02-09 ENCOUNTER — Ambulatory Visit
Admission: RE | Admit: 2014-02-09 | Discharge: 2014-02-09 | Disposition: A | Discharge status: Home / Self Care | Payer: BC Managed Care – PPO | Source: Ambulatory Visit

## 2014-02-09 ENCOUNTER — Encounter (INDEPENDENT_AMBULATORY_CARE_PROVIDER_SITE_OTHER): Payer: Self-pay

## 2014-02-09 DIAGNOSIS — Z1231 Encounter for screening mammogram for malignant neoplasm of breast: Secondary | ICD-10-CM

## 2014-05-14 ENCOUNTER — Encounter: Payer: Self-pay | Admitting: Family Medicine

## 2014-05-17 ENCOUNTER — Other Ambulatory Visit: Payer: BC Managed Care – PPO

## 2014-05-17 DIAGNOSIS — Z0000 Encounter for general adult medical examination without abnormal findings: Secondary | ICD-10-CM

## 2014-05-17 DIAGNOSIS — E539 Vitamin B deficiency, unspecified: Secondary | ICD-10-CM

## 2014-05-17 LAB — CBC WITH DIFFERENTIAL/PLATELET
Basophils Absolute: 0.1 10*3/uL (ref 0.0–0.1)
Basophils Relative: 1 % (ref 0–1)
Eosinophils Absolute: 0.1 10*3/uL (ref 0.0–0.7)
Eosinophils Relative: 2 % (ref 0–5)
HCT: 42.6 % (ref 36.0–46.0)
Hemoglobin: 14.4 g/dL (ref 12.0–15.0)
Lymphocytes Relative: 29 % (ref 12–46)
Lymphs Abs: 2.1 10*3/uL (ref 0.7–4.0)
MCH: 29.7 pg (ref 26.0–34.0)
MCHC: 33.8 g/dL (ref 30.0–36.0)
MCV: 87.8 fL (ref 78.0–100.0)
Monocytes Absolute: 0.5 10*3/uL (ref 0.1–1.0)
Monocytes Relative: 7 % (ref 3–12)
Neutro Abs: 4.3 10*3/uL (ref 1.7–7.7)
Neutrophils Relative %: 61 % (ref 43–77)
Platelets: 258 10*3/uL (ref 150–400)
RBC: 4.85 MIL/uL (ref 3.87–5.11)
RDW: 14.6 % (ref 11.5–15.5)
WBC: 7.1 10*3/uL (ref 4.0–10.5)

## 2014-05-17 LAB — COMPLETE METABOLIC PANEL WITH GFR
ALT: 17 U/L (ref 0–35)
AST: 17 U/L (ref 0–37)
Albumin: 4.4 g/dL (ref 3.5–5.2)
Alkaline Phosphatase: 58 U/L (ref 39–117)
BUN: 12 mg/dL (ref 6–23)
CO2: 28 mEq/L (ref 19–32)
Calcium: 9.7 mg/dL (ref 8.4–10.5)
Chloride: 102 mEq/L (ref 96–112)
Creat: 0.85 mg/dL (ref 0.50–1.10)
GFR, Est African American: 84 mL/min
GFR, Est Non African American: 73 mL/min
Glucose, Bld: 81 mg/dL (ref 70–99)
Potassium: 4.6 mEq/L (ref 3.5–5.3)
Sodium: 140 mEq/L (ref 135–145)
Total Bilirubin: 0.5 mg/dL (ref 0.2–1.2)
Total Protein: 7 g/dL (ref 6.0–8.3)

## 2014-05-17 LAB — LIPID PANEL
Cholesterol: 139 mg/dL (ref 0–200)
HDL: 62 mg/dL (ref >39)
LDL Cholesterol: 57 mg/dL (ref 0–99)
Total CHOL/HDL Ratio: 2.2 Ratio
Triglycerides: 102 mg/dL (ref <150)
VLDL: 20 mg/dL (ref 0–40)

## 2014-05-17 LAB — VITAMIN B12: Vitamin B-12: 472 pg/mL (ref 211–911)

## 2014-05-28 ENCOUNTER — Encounter: Payer: Self-pay | Admitting: Family Medicine

## 2014-05-28 ENCOUNTER — Ambulatory Visit (INDEPENDENT_AMBULATORY_CARE_PROVIDER_SITE_OTHER): Payer: BC Managed Care – PPO | Admitting: Family Medicine

## 2014-05-28 VITALS — BP 128/64 | HR 74 | Temp 98.0°F | Resp 16 | Ht 65.0 in | Wt 149.0 lb

## 2014-05-28 DIAGNOSIS — Z0000 Encounter for general adult medical examination without abnormal findings: Secondary | ICD-10-CM

## 2014-05-28 DIAGNOSIS — J9819 Other pulmonary collapse: Secondary | ICD-10-CM

## 2014-05-28 DIAGNOSIS — Z23 Encounter for immunization: Secondary | ICD-10-CM

## 2014-05-28 MED ORDER — ZOSTER VACCINE LIVE 19400 UNT/0.65ML ~~LOC~~ SOLR: 0.6500 mL | Freq: Once | SUBCUTANEOUS | Status: DC

## 2014-05-28 MED ORDER — LEVOFLOXACIN 750 MG PO TABS
750.0000 mg | ORAL_TABLET | Freq: Every day | ORAL | Status: DC
Start: 2014-05-28 — End: 2015-10-03

## 2014-05-28 NOTE — Progress Notes (Signed)
Subjective:    Patient ID: Catherine Long, female    DOB: [birth year], 63 y.o.   MRN: [id number]  HPI Patient is a very pleasant 63 year old white female who is here today for complete physical exam. Her mammogram was performed in July and is up-to-date. She has her Pap smear performed at her gynecologist. Her bone density is performing her gynecologist. Her most recent lab work as listed below and is excellent: Lab on 05/17/2014  Component Date Value Ref Range Status  . WBC 05/17/2014 7.1  4.0 - 10.5 K/uL Final  . RBC 05/17/2014 4.85  3.87 - 5.11 MIL/uL Final  . Hemoglobin 05/17/2014 14.4  12.0 - 15.0 g/dL Final  . HCT 05/17/2014 42.6  36.0 - 46.0 % Final  . MCV 05/17/2014 87.8  78.0 - 100.0 fL Final  . MCH 05/17/2014 29.7  26.0 - 34.0 pg Final  . MCHC 05/17/2014 33.8  30.0 - 36.0 g/dL Final  . RDW 05/17/2014 14.6  11.5 - 15.5 % Final  . Platelets 05/17/2014 258  150 - 400 K/uL Final  . Neutrophils Relative % 05/17/2014 61  43 - 77 % Final  . Neutro Abs 05/17/2014 4.3  1.7 - 7.7 K/uL Final  . Lymphocytes Relative 05/17/2014 29  12 - 46 % Final  . Lymphs Abs 05/17/2014 2.1  0.7 - 4.0 K/uL Final  . Monocytes Relative 05/17/2014 7  3 - 12 % Final  . Monocytes Absolute 05/17/2014 0.5  0.1 - 1.0 K/uL Final  . Eosinophils Relative 05/17/2014 2  0 - 5 % Final  . Eosinophils Absolute 05/17/2014 0.1  0.0 - 0.7 K/uL Final  . Basophils Relative 05/17/2014 1  0 - 1 % Final  . Basophils Absolute 05/17/2014 0.1  0.0 - 0.1 K/uL Final  . Smear Review 05/17/2014 Criteria for review not met   Final  . Sodium 05/17/2014 140  135 - 145 mEq/L Final  . Potassium 05/17/2014 4.6  3.5 - 5.3 mEq/L Final  . Chloride 05/17/2014 102  96 - 112 mEq/L Final  . CO2 05/17/2014 28  19 - 32 mEq/L Final  . Glucose, Bld 05/17/2014 81  70 - 99 mg/dL Final  . BUN 05/17/2014 12  6 - 23 mg/dL Final  . Creat 05/17/2014 0.85  0.50 - 1.10 mg/dL Final  . Total Bilirubin 05/17/2014 0.5  0.2 - 1.2 mg/dL Final  . Alkaline  Phosphatase 05/17/2014 58  39 - 117 U/L Final  . AST 05/17/2014 17  0 - 37 U/L Final  . ALT 05/17/2014 17  0 - 35 U/L Final  . Total Protein 05/17/2014 7.0  6.0 - 8.3 g/dL Final  . Albumin 05/17/2014 4.4  3.5 - 5.2 g/dL Final  . Calcium 05/17/2014 9.7  8.4 - 10.5 mg/dL Final  . GFR, Est African American 05/17/2014 84   Final  . GFR, Est Non African American 05/17/2014 73   Final   Comment:                            The estimated GFR is a calculation valid for adults (>=63 years old)                          that uses the CKD-EPI algorithm to adjust for age and sex. It is  not to be used for children, pregnant women, hospitalized patients,                             patients on dialysis, or with rapidly changing kidney function.                          According to the NKDEP, eGFR >89 is normal, 60-89 shows mild                          impairment, 30-59 shows moderate impairment, 15-29 shows severe                          impairment and <15 is ESRD.                             Marland Kitchen Vitamin B-12 05/17/2014 472  211 - 911 pg/mL Final  . Cholesterol 05/17/2014 139  0 - 200 mg/dL Final   Comment: ATP III Classification:                                < 200        mg/dL        Desirable                               200 - 239     mg/dL        Borderline High                               >= 240        mg/dL        High                             . Triglycerides 05/17/2014 102  <150 mg/dL Final  . HDL 05/17/2014 62  >39 mg/dL Final  . Total CHOL/HDL Ratio 05/17/2014 2.2   Final  . VLDL 05/17/2014 20  0 - 40 mg/dL Final  . LDL Cholesterol 05/17/2014 57  0 - 99 mg/dL Final   Comment:                            Total Cholesterol/HDL Ratio:CHD Risk                                                 Coronary Heart Disease Risk Table                                                                 Men       Women  1/2 Average Risk               3.4        3.3                                       Average Risk              5.0        4.4                                    2X Average Risk              9.6        7.1                                    3X Average Risk             23.4       11.0                          Use the calculated Patient Ratio above and the CHD Risk table                           to determine the patient's CHD Risk.                          ATP III Classification (LDL):                                < 100        mg/dL         Optimal                               100 - 129     mg/dL         Near or Above Optimal                               130 - 159     mg/dL         Borderline High                               160 - 189     mg/dL         High                                > 190        mg/dL         Very High                              She is overdue for colonoscopy. Otherwise she has no concerns. Past Medical History  Diagnosis Date  . Melanoma   .  Right middle lobe syndrome    Past Surgical History  Procedure Laterality Date  . Total hip arthroplasty  2003 and 2006    Bilateral   Current Outpatient Prescriptions on File Prior to Visit  Medication Sig Dispense Refill  . aspirin 81 MG tablet Take 81 mg by mouth daily.      . B Complex-C (SUPER B COMPLEX) TABS Take 1 tablet by mouth daily.      . cholecalciferol (VITAMIN D) 1000 UNITS tablet Take 1,000 Units by mouth daily.      . Multiple Vitamins-Minerals (MULTI COMPLETE/IRON PO) Take 1 tablet by mouth daily.      . mometasone-formoterol (DULERA) 100-5 MCG/ACT AERO Inhale 2 puffs into the lungs 2 (two) times daily.  1 Inhaler  11   No current facility-administered medications on file prior to visit.   No Known Allergies History   Social History  . Marital Status: Widowed    Spouse Name: N/A    Number of Children: 2  . Years of Education: N/A   Occupational History  . Retired    Social History Main Topics  . Smoking status:  Never Smoker   . Smokeless tobacco: Never Used  . Alcohol Use: Yes     Comment: Socially  . Drug Use: No  . Sexual Activity: Not on file   Other Topics Concern  . Not on file   Social History Narrative  . No narrative on file   Family History  Problem Relation Age of Onset  . Heart disease Father   . Breast cancer Mother       Review of Systems  All other systems reviewed and are negative.      Objective:   Physical Exam  Vitals reviewed. Constitutional: She is oriented to person, place, and time. She appears well-developed and well-nourished. No distress.  HENT:  Head: Normocephalic and atraumatic.  Right Ear: External ear normal.  Left Ear: External ear normal.  Nose: Nose normal.  Mouth/Throat: Oropharynx is clear and moist. No oropharyngeal exudate.  Eyes: Conjunctivae and EOM are normal. Pupils are equal, round, and reactive to light. Right eye exhibits no discharge. Left eye exhibits no discharge. No scleral icterus.  Neck: Normal range of motion. Neck supple. No JVD present. No tracheal deviation present. No thyromegaly present.  Cardiovascular: Normal rate, regular rhythm, normal heart sounds and intact distal pulses.  Exam reveals no gallop and no friction rub.   No murmur heard. Pulmonary/Chest: Effort normal and breath sounds normal. No stridor. No respiratory distress. She has no wheezes. She has no rales. She exhibits no tenderness.  Abdominal: Soft. Bowel sounds are normal. She exhibits no distension and no mass. There is no tenderness. There is no rebound and no guarding.  Musculoskeletal: Normal range of motion. She exhibits no edema and no tenderness.  Lymphadenopathy:    She has no cervical adenopathy.  Neurological: She is alert and oriented to person, place, and time. She has normal reflexes. She displays normal reflexes. No cranial nerve deficit. She exhibits normal muscle tone. Coordination normal.  Skin: Skin is warm. No rash noted. She is not  diaphoretic. No erythema. No pallor.  Psychiatric: She has a normal mood and affect. Her behavior is normal. Judgment and thought content normal.          Assessment & Plan:  Right middle lobe syndrome - Plan: levofloxacin (LEVAQUIN) 750 MG tablet  Routine general medical examination at a health care facility  Patient's physical exam is  completely normal. She received her flu shot today. We also discussed the shingles vaccine. Her blood pressure is excellent. Her lab work is excellent her cancer screening is up-to-date.

## 2014-05-28 NOTE — Addendum Note (Signed)
Addended by: Shary Decamp B on: 05/28/2014 05:12 PM   Modules accepted: Orders

## 2014-06-08 ENCOUNTER — Ambulatory Visit (INDEPENDENT_AMBULATORY_CARE_PROVIDER_SITE_OTHER): Payer: BC Managed Care – PPO | Admitting: Family Medicine

## 2014-06-08 ENCOUNTER — Encounter: Payer: Self-pay | Admitting: Family Medicine

## 2014-06-08 VITALS — BP 100/68 | HR 80 | Temp 98.1°F | Resp 16 | Wt 148.0 lb

## 2014-06-08 DIAGNOSIS — S0512XA Contusion of eyeball and orbital tissues, left eye, initial encounter: Secondary | ICD-10-CM

## 2014-06-08 MED ORDER — SULFAMETHOXAZOLE-TRIMETHOPRIM 800-160 MG PO TABS: 1.0000 | ORAL_TABLET | Freq: Two times a day (BID) | ORAL | Status: DC

## 2014-06-08 NOTE — Progress Notes (Signed)
Subjective:    Patient ID: Catherine Long, female    DOB: [birth year], 63 y.o.   MRN: [id number]  HPI  Wednesday evening, the patient struck her left temple right above her left eye against her grandsons head.  This morning she developed swelling around her left eye particularly her upper eyelid and lower eyelid. There was no erythema or warmth. The patient applied ice to the swelling and the swelling has completely subsided at this time. She is tender to palpation over her left temple and her left upper eyebrow. There is no erythema or warmth. She has painless extraocular movements. She denies any blurred vision. Pupils are equal round and reactive to light. There is no  Conjunctival hemorrhage or erythema. There is no erythema or hemorrhage behind the tympanic membrane. The patient has normal hearing. She has no pain with range of motion in her left TMJ. Past Medical History  Diagnosis Date  . Melanoma   . Right middle lobe syndrome    Past Surgical History  Procedure Laterality Date  . Total hip arthroplasty  2003 and 2006    Bilateral   Current Outpatient Prescriptions on File Prior to Visit  Medication Sig Dispense Refill  . aspirin 81 MG tablet Take 81 mg by mouth daily.    . B Complex-C (SUPER B COMPLEX) TABS Take 1 tablet by mouth daily.    . cholecalciferol (VITAMIN D) 1000 UNITS tablet Take 1,000 Units by mouth daily.    Marland Kitchen levofloxacin (LEVAQUIN) 750 MG tablet Take 1 tablet (750 mg total) by mouth daily. 7 tablet 3  . mometasone-formoterol (DULERA) 100-5 MCG/ACT AERO Inhale 2 puffs into the lungs 2 (two) times daily. 1 Inhaler 11  . Multiple Vitamins-Minerals (MULTI COMPLETE/IRON PO) Take 1 tablet by mouth daily.     No current facility-administered medications on file prior to visit.   No Known Allergies History   Social History  . Marital Status: Widowed    Spouse Name: N/A    Number of Children: 2  . Years of Education: N/A   Occupational History  . Retired     Social History Main Topics  . Smoking status: Never Smoker   . Smokeless tobacco: Never Used  . Alcohol Use: Yes     Comment: Socially  . Drug Use: No  . Sexual Activity: Not on file   Other Topics Concern  . Not on file   Social History Narrative     Review of Systems  All other systems reviewed and are negative.      Objective:   Physical Exam  Constitutional: She appears well-developed and well-nourished. No distress.  HENT:  Head: Normocephalic and atraumatic.  Right Ear: External ear normal.  Left Ear: External ear normal.  Nose: Nose normal.  Mouth/Throat: Oropharynx is clear and moist. No oropharyngeal exudate.  Eyes: Conjunctivae and EOM are normal. Pupils are equal, round, and reactive to light. Right eye exhibits no discharge. Left eye exhibits no discharge. No scleral icterus.  Neck: Neck supple. No tracheal deviation present. No thyromegaly present.  Cardiovascular: Normal rate, regular rhythm and normal heart sounds.   Pulmonary/Chest: Effort normal and breath sounds normal.  Lymphadenopathy:    She has no cervical adenopathy.  Skin: She is not diaphoretic.  Vitals reviewed.         Assessment & Plan:  Eye contusion, left, initial encounter  I believe this is swelling due to a bruise from her head trauma. I recommended ibuprofen 600 mg every 8  hours. I recommended that she apply ice to the area 2-3 times a day as needed. If the patient develops signs or symptoms of preseptal cellulitis, I recommended Bactrim double strength 1 by mouth twice a day for 7 days. At the present time there is no evidence of cellulitis

## 2014-06-26 ENCOUNTER — Telehealth: Payer: Self-pay | Admitting: Physician Assistant

## 2014-06-26 NOTE — Telephone Encounter (Signed)
lmtrc

## 2014-06-26 NOTE — Telephone Encounter (Signed)
[phone]  Pt is needing to speak to you regarding a colonoscopy that was suppose to be scheduled

## 2014-07-03 NOTE — Telephone Encounter (Signed)
Pt called back and stated that she had a colonoscopy done in 2008 and was performed someplace in [place], informed pt that was not due until 2018, pt is going to call and have records sent to [place] for review, Dr. [place] told her that she was to have another colonoscopy done in 7 yrs, pt states she has no h/o cancer in family or herself.

## 2014-07-24 ENCOUNTER — Telehealth: Payer: Self-pay | Admitting: Physician Assistant

## 2014-07-24 NOTE — Telephone Encounter (Signed)
Patient is calling to say that she dropped some forms off for dr pickard  about her previous colonoscopy, and she was wondering if dr pickard had a chance to go over these and if he recommended her to get another colonoscopy ? [phone]

## 2014-07-25 NOTE — Telephone Encounter (Signed)
Pt called and notified and pt would like to call schedule for herself. Phone numbers were provided for [facility] and [facility]. If she has any trouble scheduling an appt she will call us back.

## 2014-08-16 ENCOUNTER — Encounter: Payer: Self-pay | Admitting: Family Medicine

## 2015-08-02 ENCOUNTER — Encounter: Payer: Self-pay | Admitting: Family Medicine

## 2015-08-02 LAB — HM COLONOSCOPY

## 2015-10-03 ENCOUNTER — Encounter: Payer: Self-pay | Admitting: Physician Assistant

## 2015-10-03 ENCOUNTER — Ambulatory Visit (INDEPENDENT_AMBULATORY_CARE_PROVIDER_SITE_OTHER): Payer: BC Managed Care – PPO | Admitting: Physician Assistant

## 2015-10-03 VITALS — BP 102/60 | HR 76 | Temp 99.3°F | Resp 18 | Wt 150.0 lb

## 2015-10-03 DIAGNOSIS — Z23 Encounter for immunization: Secondary | ICD-10-CM | POA: Diagnosis not present

## 2015-10-03 DIAGNOSIS — J111 Influenza due to unidentified influenza virus with other respiratory manifestations: Secondary | ICD-10-CM | POA: Diagnosis not present

## 2015-10-03 LAB — INFLUENZA A AND B AG, IMMUNOASSAY
Influenza A Antigen: NOT DETECTED
Influenza B Antigen: NOT DETECTED

## 2015-10-03 MED ORDER — OSELTAMIVIR PHOSPHATE 75 MG PO CAPS: 75.0000 mg | ORAL_CAPSULE | Freq: Two times a day (BID) | ORAL | Status: DC

## 2015-10-03 NOTE — Progress Notes (Signed)
    Patient ID: Catherine Long MRN: [id number], DOB: 1951-01-22, 65 y.o. Date of Encounter: 10/03/2015, 12:26 PM    Chief Complaint:  Chief Complaint  Patient presents with  . sick x 1 day    "feels awful" congestion, earache, HA, nausea     HPI: 65 y.o. year old female since with above.  She says that yesterday this all of a sudden "hit me like a bomb." Says by last night she felt horrible. Was having symptoms listed above. Says that now she is not feeling as nauseous. Says now she is having increased cough and chest congestion. She was feeling symptomatic fevers and chills last night but had no thermometer to check temperature. During visit she has very congested sounding cough.     Home Meds:   Outpatient Prescriptions Prior to Visit  Medication Sig Dispense Refill  . aspirin 81 MG tablet Take 81 mg by mouth daily.    . B Complex-C (SUPER B COMPLEX) TABS Take 1 tablet by mouth daily.    . cholecalciferol (VITAMIN D) 1000 UNITS tablet Take 1,000 Units by mouth daily.    . Multiple Vitamins-Minerals (MULTI COMPLETE/IRON PO) Take 1 tablet by mouth daily.    . mometasone-formoterol (DULERA) 100-5 MCG/ACT AERO Inhale 2 puffs into the lungs 2 (two) times daily. (Patient not taking: Reported on 10/03/2015) 1 Inhaler 11  . levofloxacin (LEVAQUIN) 750 MG tablet Take 1 tablet (750 mg total) by mouth daily. 7 tablet 3  . sulfamethoxazole-trimethoprim (BACTRIM DS,SEPTRA DS) 800-160 MG per tablet Take 1 tablet by mouth 2 (two) times daily. 14 tablet 0   No facility-administered medications prior to visit.    Allergies: No Known Allergies    Review of Systems: See HPI for pertinent ROS. All other ROS negative.    Physical Exam: Blood pressure 102/60, pulse 76, temperature 99.3 F (37.4 C), temperature source Oral, resp. rate 18, weight 150 lb (68.04 kg)., Body mass index is 24.96 kg/(m^2). General: WNWD WF.  Appears in no acute distress. HEENT: Normocephalic, atraumatic, eyes without  discharge, sclera non-icteric, nares are without discharge. Bilateral auditory canals clear, TM's are without perforation, pearly grey and translucent with reflective cone of light bilaterally. Oral cavity moist, posterior pharynx without exudate, erythema, peritonsillar abscess.  Neck: Supple. No thyromegaly. No lymphadenopathy. Lungs: Clear bilaterally to auscultation without wheezes, rales, or rhonchi. Breathing is unlabored. Heart: Regular rhythm. No murmurs, rubs, or gallops. Msk:  Strength and tone normal for age. Extremities/Skin: Warm and dry. No clubbing or cyanosis. No edema. No rashes or suspicious lesions. Neuro: Alert and oriented X 3. Moves all extremities spontaneously. Gait is normal. CNII-XII grossly in tact. Psych:  Responds to questions appropriately with a normal affect.     ASSESSMENT AND PLAN:  65 y.o. year old female with   1. Influenza Influenza test negative but clinically consistent with influenza. Discussed with her and told her to start the Tamiflu immediately and take as directed. An use over-the-counter Tylenol or Motrin to keep controlled fevers and body aches. Can use over-the-counter decongestants and cough medications as needed for symptom relief. I'll up if symptoms worsen significantly or are not resolving in 7-10 days. - oseltamivir (TAMIFLU) 75 MG capsule; Take 1 capsule (75 mg total) by mouth 2 (two) times daily.  Dispense: 10 capsule; Refill: 0   - Influenza A and B Ag, Immunoassay   - Influenza A and B Ag, Immunoassay   Signed, [address] [place], [place], [facility] 10/03/2015 12:26 PM

## 2016-05-11 ENCOUNTER — Other Ambulatory Visit: Payer: BC Managed Care – PPO

## 2016-05-13 ENCOUNTER — Other Ambulatory Visit: Payer: Medicare Other

## 2016-05-13 ENCOUNTER — Other Ambulatory Visit: Payer: Self-pay | Admitting: Family Medicine

## 2016-05-13 DIAGNOSIS — J9819 Other pulmonary collapse: Secondary | ICD-10-CM

## 2016-05-13 DIAGNOSIS — Z0000 Encounter for general adult medical examination without abnormal findings: Secondary | ICD-10-CM

## 2016-05-13 DIAGNOSIS — Z79899 Other long term (current) drug therapy: Secondary | ICD-10-CM

## 2016-05-13 LAB — LIPID PANEL
Cholesterol: 141 mg/dL (ref 125–200)
HDL: 67 mg/dL (ref >=46)
LDL Cholesterol: 52 mg/dL (ref <130)
Total CHOL/HDL Ratio: 2.1 Ratio (ref <=5.0)
Triglycerides: 109 mg/dL (ref <150)
VLDL: 22 mg/dL (ref <30)

## 2016-05-13 LAB — COMPLETE METABOLIC PANEL WITH GFR
ALT: 13 U/L (ref 6–29)
AST: 17 U/L (ref 10–35)
Albumin: 4 g/dL (ref 3.6–5.1)
Alkaline Phosphatase: 48 U/L (ref 33–130)
BUN: 10 mg/dL (ref 7–25)
CO2: 24 mmol/L (ref 20–31)
Calcium: 9 mg/dL (ref 8.6–10.4)
Chloride: 103 mmol/L (ref 98–110)
Creat: 0.86 mg/dL (ref 0.50–0.99)
GFR, Est African American: 82 mL/min (ref >=60)
GFR, Est Non African American: 71 mL/min (ref >=60)
Glucose, Bld: 80 mg/dL (ref 70–99)
Potassium: 4.2 mmol/L (ref 3.5–5.3)
Sodium: 139 mmol/L (ref 135–146)
Total Bilirubin: 0.4 mg/dL (ref 0.2–1.2)
Total Protein: 6.7 g/dL (ref 6.1–8.1)

## 2016-05-13 LAB — CBC WITH DIFFERENTIAL/PLATELET
Basophils Absolute: 65 cells/uL (ref 0–200)
Basophils Relative: 1 %
Eosinophils Absolute: 130 cells/uL (ref 15–500)
Eosinophils Relative: 2 %
HCT: 39.2 % (ref 35.0–45.0)
Hemoglobin: 13 g/dL (ref 12.0–15.0)
Lymphocytes Relative: 29 %
Lymphs Abs: 1885 cells/uL (ref 850–3900)
MCH: 29 pg (ref 27.0–33.0)
MCHC: 33.2 g/dL (ref 32.0–36.0)
MCV: 87.5 fL (ref 80.0–100.0)
MPV: 9.1 fL (ref 7.5–12.5)
Monocytes Absolute: 390 cells/uL (ref 200–950)
Monocytes Relative: 6 %
Neutro Abs: 4030 cells/uL (ref 1500–7800)
Neutrophils Relative %: 62 %
Platelets: 269 10*3/uL (ref 140–400)
RBC: 4.48 MIL/uL (ref 3.80–5.10)
RDW: 14.5 % (ref 11.0–15.0)
WBC: 6.5 10*3/uL (ref 3.8–10.8)

## 2016-05-13 LAB — TSH: TSH: 2.19 mIU/L

## 2016-05-15 ENCOUNTER — Ambulatory Visit (INDEPENDENT_AMBULATORY_CARE_PROVIDER_SITE_OTHER): Payer: Medicare Other | Admitting: Family Medicine

## 2016-05-15 ENCOUNTER — Encounter: Payer: Self-pay | Admitting: Family Medicine

## 2016-05-15 VITALS — BP 108/64 | HR 78 | Temp 98.0°F | Resp 16 | Ht 65.0 in | Wt 150.0 lb

## 2016-05-15 DIAGNOSIS — L301 Dyshidrosis [pompholyx]: Secondary | ICD-10-CM

## 2016-05-15 DIAGNOSIS — Z0000 Encounter for general adult medical examination without abnormal findings: Secondary | ICD-10-CM | POA: Diagnosis not present

## 2016-05-15 DIAGNOSIS — Z23 Encounter for immunization: Secondary | ICD-10-CM | POA: Diagnosis not present

## 2016-05-15 MED ORDER — MOMETASONE FUROATE 0.1 % EX OINT: TOPICAL_OINTMENT | Freq: Every day | CUTANEOUS | 0 refills | Status: DC

## 2016-05-15 NOTE — Progress Notes (Signed)
Subjective:    Patient ID: Catherine Long, female    DOB: [birth year], 65 y.o.   MRN: [id number]  HPI Patient is a very pleasant 65 year old white female who is here today for complete physical exam. Her mammogram, Pap smear, and bone density is performed by her gynecologist. This is scheduled for later this year. She had a bone density last year that showed mild osteopenia. Otherwise she is doing well. Immunization History  Administered Date(s) Administered  . Influenza Split 04/26/2012  . Influenza,inj,Quad PF,36+ Mos 06/01/2013, 05/28/2014  . Pneumococcal Polysaccharide-23 04/26/2012  . Zoster 05/30/2014   She is due for a flu shot as well as Prevnar 13.  Her most recent lab work as listed below and is excellent: Appointment on 05/13/2016  Component Date Value Ref Range Status  . Sodium 05/13/2016 139  135 - 146 mmol/L Final  . Potassium 05/13/2016 4.2  3.5 - 5.3 mmol/L Final  . Chloride 05/13/2016 103  98 - 110 mmol/L Final  . CO2 05/13/2016 24  20 - 31 mmol/L Final  . Glucose, Bld 05/13/2016 80  70 - 99 mg/dL Final  . BUN 05/13/2016 10  7 - 25 mg/dL Final  . Creat 05/13/2016 0.86  0.50 - 0.99 mg/dL Final   Comment:   For patients > or = 65 years of age: The upper reference limit for Creatinine is approximately 13% higher for people identified as African-American.     . Total Bilirubin 05/13/2016 0.4  0.2 - 1.2 mg/dL Final  . Alkaline Phosphatase 05/13/2016 48  33 - 130 U/L Final  . AST 05/13/2016 17  10 - 35 U/L Final  . ALT 05/13/2016 13  6 - 29 U/L Final  . Total Protein 05/13/2016 6.7  6.1 - 8.1 g/dL Final  . Albumin 05/13/2016 4.0  3.6 - 5.1 g/dL Final  . Calcium 05/13/2016 9.0  8.6 - 10.4 mg/dL Final  . GFR, Est African American 05/13/2016 82  >=60 mL/min Final  . GFR, Est Non African American 05/13/2016 71  >=60 mL/min Final  . TSH 05/13/2016 2.19  mIU/L Final   Comment:   Reference Range   > or = 20 Years  0.40-4.50   Pregnancy Range First trimester   0.26-2.66 Second trimester 0.55-2.73 Third trimester  0.43-2.91     . Cholesterol 05/13/2016 141  125 - 200 mg/dL Final  . Triglycerides 05/13/2016 109  <150 mg/dL Final  . HDL 05/13/2016 67  >=46 mg/dL Final  . Total CHOL/HDL Ratio 05/13/2016 2.1  <=5.0 Ratio Final  . VLDL 05/13/2016 22  <30 mg/dL Final  . LDL Cholesterol 05/13/2016 52  <130 mg/dL Final   Comment:   Total Cholesterol/HDL Ratio:CHD Risk                        Coronary Heart Disease Risk Table                                        Men       Women          1/2 Average Risk              3.4        3.3              Average Risk  5.0        4.4           2X Average Risk              9.6        7.1           3X Average Risk             23.4       11.0 Use the calculated Patient Ratio above and the CHD Risk table  to determine the patient's CHD Risk.   . WBC 05/13/2016 6.5  3.8 - 10.8 K/uL Final  . RBC 05/13/2016 4.48  3.80 - 5.10 MIL/uL Final  . Hemoglobin 05/13/2016 13.0  12.0 - 15.0 g/dL Final  . HCT 05/13/2016 39.2  35.0 - 45.0 % Final  . MCV 05/13/2016 87.5  80.0 - 100.0 fL Final  . MCH 05/13/2016 29.0  27.0 - 33.0 pg Final  . MCHC 05/13/2016 33.2  32.0 - 36.0 g/dL Final  . RDW 05/13/2016 14.5  11.0 - 15.0 % Final  . Platelets 05/13/2016 269  140 - 400 K/uL Final  . MPV 05/13/2016 9.1  7.5 - 12.5 fL Final  . Neutro Abs 05/13/2016 4030  1,500 - 7,800 cells/uL Final  . Lymphs Abs 05/13/2016 1885  850 - 3,900 cells/uL Final  . Monocytes Absolute 05/13/2016 390  200 - 950 cells/uL Final  . Eosinophils Absolute 05/13/2016 130  15 - 500 cells/uL Final  . Basophils Absolute 05/13/2016 65  0 - 200 cells/uL Final  . Neutrophils Relative % 05/13/2016 62  % Final  . Lymphocytes Relative 05/13/2016 29  % Final  . Monocytes Relative 05/13/2016 6  % Final  . Eosinophils Relative 05/13/2016 2  % Final  . Basophils Relative 05/13/2016 1  % Final  . Smear Review 05/13/2016 Criteria for review not met   Final     Past Medical History:  Diagnosis Date  . Melanoma ([place])   . Right middle lobe syndrome    Past Surgical History:  Procedure Laterality Date  . TOTAL HIP ARTHROPLASTY  2003 and 2006   Bilateral   Current Outpatient Prescriptions on File Prior to Visit  Medication Sig Dispense Refill  . aspirin 81 MG tablet Take 81 mg by mouth daily.    . B Complex-C (SUPER B COMPLEX) TABS Take 1 tablet by mouth daily.    . Calcium Carb-Cholecalciferol (CALCIUM 1000 + D PO) Take 1 tablet by mouth daily.    . cholecalciferol (VITAMIN D) 1000 UNITS tablet Take 1,000 Units by mouth daily.    . Multiple Vitamins-Minerals (MULTI COMPLETE/IRON PO) Take 1 tablet by mouth daily.    . Omega-3 Fatty Acids (FISH OIL PO) Take 1 capsule by mouth daily.     No current facility-administered medications on file prior to visit.    No Known Allergies Social History   Social History  . Marital status: Widowed    Spouse name: N/A  . Number of children: 2  . Years of education: N/A   Occupational History  . Retired    Social History Main Topics  . Smoking status: Never Smoker  . Smokeless tobacco: Never Used  . Alcohol use Yes     Comment: Socially  . Drug use: No  . Sexual activity: Not on file   Other Topics Concern  . Not on file   Social History Narrative  . No narrative on file   Family History  Problem Relation  Age of Onset  . Heart disease Father   . Breast cancer Mother       Review of Systems  All other systems reviewed and are negative.      Objective:   Physical Exam  Constitutional: She is oriented to person, place, and time. She appears well-developed and well-nourished. No distress.  HENT:  Head: Normocephalic and atraumatic.  Right Ear: External ear normal.  Left Ear: External ear normal.  Nose: Nose normal.  Mouth/Throat: Oropharynx is clear and moist. No oropharyngeal exudate.  Eyes: Conjunctivae and EOM are normal. Pupils are equal, round, and reactive to light.  Right eye exhibits no discharge. Left eye exhibits no discharge. No scleral icterus.  Neck: Normal range of motion. Neck supple. No JVD present. No tracheal deviation present. No thyromegaly present.  Cardiovascular: Normal rate, regular rhythm, normal heart sounds and intact distal pulses.  Exam reveals no gallop and no friction rub.   No murmur heard. Pulmonary/Chest: Effort normal and breath sounds normal. No stridor. No respiratory distress. She has no wheezes. She has no rales. She exhibits no tenderness.  Abdominal: Soft. Bowel sounds are normal. She exhibits no distension and no mass. There is no tenderness. There is no rebound and no guarding.  Musculoskeletal: Normal range of motion. She exhibits no edema or tenderness.  Lymphadenopathy:    She has no cervical adenopathy.  Neurological: She is alert and oriented to person, place, and time. She has normal reflexes. No cranial nerve deficit. She exhibits normal muscle tone. Coordination normal.  Skin: Skin is warm. No rash noted. She is not diaphoretic. No erythema. No pallor.  Psychiatric: She has a normal mood and affect. Her behavior is normal. Judgment and thought content normal.  Vitals reviewed.         Assessment & Plan:  Gen. medical exam Patient's physical exam is completely normal. She received her flu shot today. She also received Prevnar 13. The remainder of her preventative care is up-to-date. Her cancer screening is up-to-date. She denies any depression or falls. Reglan to story guidance is provided.  She declines hepatitis C and HIV screening due to the fact she has no risk factors

## 2016-05-15 NOTE — Addendum Note (Signed)
Addended by: Shary Decamp B on: 05/15/2016 12:54 PM   Modules accepted: Orders

## 2016-06-19 ENCOUNTER — Other Ambulatory Visit: Payer: Self-pay | Admitting: Nurse Practitioner

## 2016-06-19 DIAGNOSIS — Z1231 Encounter for screening mammogram for malignant neoplasm of breast: Secondary | ICD-10-CM

## 2016-07-13 ENCOUNTER — Ambulatory Visit
Admission: RE | Admit: 2016-07-13 | Discharge: 2016-07-13 | Disposition: A | Discharge status: Home / Self Care | Payer: Medicare Other | Source: Ambulatory Visit | Attending: Nurse Practitioner | Admitting: Nurse Practitioner

## 2016-07-13 DIAGNOSIS — Z1231 Encounter for screening mammogram for malignant neoplasm of breast: Secondary | ICD-10-CM

## 2016-09-11 ENCOUNTER — Ambulatory Visit (INDEPENDENT_AMBULATORY_CARE_PROVIDER_SITE_OTHER): Payer: Medicare Other | Admitting: Family Medicine

## 2016-09-11 ENCOUNTER — Encounter: Payer: Self-pay | Admitting: Family Medicine

## 2016-09-11 VITALS — BP 118/62 | HR 76 | Temp 98.4°F | Resp 14 | Ht 65.0 in | Wt 149.0 lb

## 2016-09-11 DIAGNOSIS — Z20828 Contact with and (suspected) exposure to other viral communicable diseases: Secondary | ICD-10-CM

## 2016-09-11 DIAGNOSIS — J209 Acute bronchitis, unspecified: Secondary | ICD-10-CM

## 2016-09-11 LAB — INFLUENZA A AND B AG, IMMUNOASSAY
Influenza A Antigen: NOT DETECTED
Influenza B Antigen: NOT DETECTED

## 2016-09-11 MED ORDER — LEVOFLOXACIN 750 MG PO TABS: 750.0000 mg | ORAL_TABLET | Freq: Every day | ORAL | 1 refills | Status: DC

## 2016-09-11 MED ORDER — OSELTAMIVIR PHOSPHATE 75 MG PO CAPS: 75.0000 mg | ORAL_CAPSULE | Freq: Every day | ORAL | 0 refills | Status: DC

## 2016-09-11 NOTE — Progress Notes (Signed)
   Subjective:    Patient ID: Catherine Long, female    DOB: [birth year], 66 y.o.   MRN: [id number]  Patient presents for Illness (x3 days- positive exposure to flu, body aches, harse, productive cough with green mucus) Patient here with cough for the past 3 days productive with some mild body aches and headache. She has history of chronic asthma but has not required any inhalers in the past few years she also has history of bronchiectasis was given Levaquin she had the prescription with her today however it has expired her pulmonologist had her keep this on hand when she would start to get the coughing congestion. She does have recent flu exposure ago. She does not recall she had any significant fever she's not had any chills she's had some mild decrease in appetite and some fatigue. + flu vaccine  She has been taking Alka-Seltzer plus she took ibuprofen for headache.  Review Of Systems:  GEN- denies fatigue, fever, weight loss,weakness, recent illness HEENT- denies eye drainage, change in vision, nasal discharge, CVS- denies chest pain, palpitations RESP- denies SOB, +cough, wheeze ABD- denies N/V, change in stools, abd pain GU- denies dysuria, hematuria, dribbling, incontinence MSK- denies joint pain, muscle aches, injury Neuro- + headache,  Denies dizziness, syncope, seizure activity       Objective:    BP 118/62   Pulse 76   Temp 98.4 F (36.9 C) (Oral)   Resp 14   Ht 5\' 5"  (1.651 m)   Wt 149 lb (67.6 kg)   SpO2 97%   BMI 24.79 kg/m  GEN- NAD, alert and oriented x3 HEENT- PERRL, EOMI, non injected sclera, pink conjunctiva, MMM, oropharynx clear Neck- Supple, no LAD CVS- RRR, no murmur RESP- Rhonchi bilat lower lungs, no wheeze, normal WOB  ABD-NABS,soft,NT,ND EXT- No edema Pulses- Radial, DP- 2+   FLu neg      Assessment & Plan:      Problem List Items Addressed This Visit    None    Visit Diagnoses    Acute bronchitis, unspecified organism    -  Primary   Concern for bronchitis most likely viral, b history of bronchiectasis, afebrile, neg flu but exposure. Based on history Robitussin DM, take levaquin for 5 days If her symptoms do not improve. But we'll treat with the cough medicine first. We'll also give her prophylaxis as she has been exposed the fluid and with her age and her history.    Relevant Orders   Influenza A and B Ag, Immunoassay (Completed)   Exposure to the flu       Tamiflu prophylaxis for 1 week       Note: This dictation was prepared with Dragon dictation along with smaller phrase technology. Any transcriptional errors that result from this process are unintentional.

## 2016-09-11 NOTE — Patient Instructions (Addendum)
Take robitussin DM or mucinex DM  Flu test is negative Take antibiotics for 5 days  Take tamiflu  F/U as needed

## 2017-05-11 ENCOUNTER — Other Ambulatory Visit: Payer: Self-pay | Admitting: Family Medicine

## 2017-05-11 ENCOUNTER — Other Ambulatory Visit: Payer: Medicare Other

## 2017-05-11 DIAGNOSIS — Z0000 Encounter for general adult medical examination without abnormal findings: Secondary | ICD-10-CM

## 2017-05-11 DIAGNOSIS — Z79899 Other long term (current) drug therapy: Secondary | ICD-10-CM

## 2017-05-11 DIAGNOSIS — M8580 Other specified disorders of bone density and structure, unspecified site: Secondary | ICD-10-CM

## 2017-05-11 LAB — CBC WITH DIFFERENTIAL/PLATELET
Basophils Absolute: 70 cells/uL (ref 0–200)
Basophils Relative: 1 %
Eosinophils Absolute: 133 cells/uL (ref 15–500)
Eosinophils Relative: 1.9 %
HCT: 39.2 % (ref 35.0–45.0)
Hemoglobin: 13.3 g/dL (ref 11.7–15.5)
Lymphs Abs: 2023 cells/uL (ref 850–3900)
MCH: 29.9 pg (ref 27.0–33.0)
MCHC: 33.9 g/dL (ref 32.0–36.0)
MCV: 88.1 fL (ref 80.0–100.0)
MPV: 9.2 fL (ref 7.5–12.5)
Monocytes Relative: 5.8 %
Neutro Abs: 4368 cells/uL (ref 1500–7800)
Neutrophils Relative %: 62.4 %
Platelets: 289 10*3/uL (ref 140–400)
RBC: 4.45 10*6/uL (ref 3.80–5.10)
RDW: 13.8 % (ref 11.0–15.0)
Total Lymphocyte: 28.9 %
WBC mixed population: 406 cells/uL (ref 200–950)
WBC: 7 10*3/uL (ref 3.8–10.8)

## 2017-05-11 LAB — COMPLETE METABOLIC PANEL WITH GFR
AG Ratio: 1.6 (calc) (ref 1.0–2.5)
ALT: 13 U/L (ref 6–29)
AST: 16 U/L (ref 10–35)
Albumin: 4.1 g/dL (ref 3.6–5.1)
Alkaline phosphatase (APISO): 57 U/L (ref 33–130)
BUN: 12 mg/dL (ref 7–25)
CO2: 28 mmol/L (ref 20–32)
Calcium: 9.3 mg/dL (ref 8.6–10.4)
Chloride: 103 mmol/L (ref 98–110)
Creat: 0.78 mg/dL (ref 0.50–0.99)
GFR, Est African American: 92 mL/min/{1.73_m2} (ref >=60)
GFR, Est Non African American: 79 mL/min/{1.73_m2} (ref >=60)
Globulin: 2.6 g/dL (calc) (ref 1.9–3.7)
Glucose, Bld: 88 mg/dL (ref 65–99)
Potassium: 4.3 mmol/L (ref 3.5–5.3)
Sodium: 139 mmol/L (ref 135–146)
Total Bilirubin: 0.5 mg/dL (ref 0.2–1.2)
Total Protein: 6.7 g/dL (ref 6.1–8.1)

## 2017-05-11 LAB — LIPID PANEL
Cholesterol: 158 mg/dL (ref <200)
HDL: 83 mg/dL (ref >50)
LDL Cholesterol (Calc): 57 mg/dL (calc)
Non-HDL Cholesterol (Calc): 75 mg/dL (calc) (ref <130)
Total CHOL/HDL Ratio: 1.9 (calc) (ref <5.0)
Triglycerides: 98 mg/dL (ref <150)

## 2017-05-14 ENCOUNTER — Other Ambulatory Visit: Payer: Medicare Other

## 2017-05-20 ENCOUNTER — Ambulatory Visit (INDEPENDENT_AMBULATORY_CARE_PROVIDER_SITE_OTHER): Payer: Medicare Other | Admitting: Family Medicine

## 2017-05-20 ENCOUNTER — Encounter: Payer: Self-pay | Admitting: Family Medicine

## 2017-05-20 VITALS — BP 136/90 | HR 71 | Temp 97.8°F | Resp 16 | Ht 65.0 in | Wt 153.0 lb

## 2017-05-20 DIAGNOSIS — Z0000 Encounter for general adult medical examination without abnormal findings: Secondary | ICD-10-CM

## 2017-05-20 DIAGNOSIS — Z1159 Encounter for screening for other viral diseases: Secondary | ICD-10-CM | POA: Diagnosis not present

## 2017-05-20 DIAGNOSIS — Z23 Encounter for immunization: Secondary | ICD-10-CM

## 2017-05-20 MED ORDER — ZOSTER VAC RECOMB ADJUVANTED 50 MCG/0.5ML IM SUSR: 0.5000 mL | Freq: Once | INTRAMUSCULAR | 1 refills | Status: AC

## 2017-05-20 NOTE — Addendum Note (Signed)
Addended by: Shary Decamp B on: 05/20/2017 11:58 AM   Modules accepted: Orders

## 2017-05-20 NOTE — Progress Notes (Signed)
Subjective:    Patient ID: Catherine Long, female    DOB: Mar 15, 1951, 66 y.o.   MRN: [id number]  HPI Patient is a very pleasant 66 year old white female who is here today for complete physical exam. Her mammogram, Pap smear, and bone density is performed by her gynecologist this coming December.  Colonoscopy was performed in 2016.  She is due for a booster of pneumovax 23, flu shot, and shingrix.   Immunization History  Administered Date(s) Administered  . Influenza Split 04/26/2012  . Influenza,inj,Quad PF,6+ Mos 06/01/2013, 05/28/2014, 05/15/2016  . Pneumococcal Conjugate-13 05/15/2016  . Pneumococcal Polysaccharide-23 04/26/2012  . Zoster 05/30/2014   Her most recent lab work as listed below and is excellent: Appointment on 05/11/2017  Component Date Value Ref Range Status  . WBC 05/11/2017 7.0  3.8 - 10.8 Thousand/uL Final  . RBC 05/11/2017 4.45  3.80 - 5.10 Million/uL Final  . Hemoglobin 05/11/2017 13.3  11.7 - 15.5 g/dL Final  . HCT 05/11/2017 39.2  35.0 - 45.0 % Final  . MCV 05/11/2017 88.1  80.0 - 100.0 fL Final  . MCH 05/11/2017 29.9  27.0 - 33.0 pg Final  . MCHC 05/11/2017 33.9  32.0 - 36.0 g/dL Final  . RDW 05/11/2017 13.8  11.0 - 15.0 % Final  . Platelets 05/11/2017 289  140 - 400 Thousand/uL Final  . MPV 05/11/2017 9.2  7.5 - 12.5 fL Final  . Neutro Abs 05/11/2017 4368  1,500 - 7,800 cells/uL Final  . Lymphs Abs 05/11/2017 2023  850 - 3,900 cells/uL Final  . WBC mixed population 05/11/2017 406  200 - 950 cells/uL Final  . Eosinophils Absolute 05/11/2017 133  15 - 500 cells/uL Final  . Basophils Absolute 05/11/2017 70  0 - 200 cells/uL Final  . Neutrophils Relative % 05/11/2017 62.4  % Final  . Total Lymphocyte 05/11/2017 28.9  % Final  . Monocytes Relative 05/11/2017 5.8  % Final  . Eosinophils Relative 05/11/2017 1.9  % Final  . Basophils Relative 05/11/2017 1.0  % Final  . Cholesterol 05/11/2017 158  <200 mg/dL Final  . HDL 05/11/2017 83  >50 mg/dL Final  .  Triglycerides 05/11/2017 98  <150 mg/dL Final  . LDL Cholesterol (Calc) 05/11/2017 57  mg/dL (calc) Final   Comment: Reference range: <100 . Desirable range <100 mg/dL for primary prevention;   <70 mg/dL for patients with CHD or diabetic patients  with > or = 2 CHD risk factors. Marland Kitchen LDL-C is now calculated using the Martin-Hopkins  calculation, which is a validated novel method providing  better accuracy than the Friedewald equation in the  estimation of LDL-C.  Cresenciano Genre et al. Annamaria Helling. 7824;235(36): 2061-2068  (http://education.[facility].com/faq/FAQ164)   . Total CHOL/HDL Ratio 05/11/2017 1.9  <5.0 (calc) Final  . Non-HDL Cholesterol (Calc) 05/11/2017 75  <130 mg/dL (calc) Final   Comment: For patients with diabetes plus 1 major ASCVD risk  factor, treating to a non-HDL-C goal of <100 mg/dL  (LDL-C of <70 mg/dL) is considered a therapeutic  option.   . Glucose, Bld 05/11/2017 88  65 - 99 mg/dL Final   Comment: .            Fasting reference interval .   . BUN 05/11/2017 12  7 - 25 mg/dL Final  . Creat 05/11/2017 0.78  0.50 - 0.99 mg/dL Final   Comment: For patients >38 years of age, the reference limit for Creatinine is approximately 13% higher for people identified as African-American. Marland Kitchen   Marland Kitchen  GFR, Est Non African American 05/11/2017 79  > OR = 60 mL/min/1.84m2 Final  . GFR, Est African American 05/11/2017 92  > OR = 60 mL/min/1.71m2 Final  . BUN/Creatinine Ratio 44/31/5400 NOT APPLICABLE  6 - 22 (calc) Final  . Sodium 05/11/2017 139  135 - 146 mmol/L Final  . Potassium 05/11/2017 4.3  3.5 - 5.3 mmol/L Final  . Chloride 05/11/2017 103  98 - 110 mmol/L Final  . CO2 05/11/2017 28  20 - 32 mmol/L Final  . Calcium 05/11/2017 9.3  8.6 - 10.4 mg/dL Final  . Total Protein 05/11/2017 6.7  6.1 - 8.1 g/dL Final  . Albumin 05/11/2017 4.1  3.6 - 5.1 g/dL Final  . Globulin 05/11/2017 2.6  1.9 - 3.7 g/dL (calc) Final  . AG Ratio 05/11/2017 1.6  1.0 - 2.5 (calc) Final  . Total  Bilirubin 05/11/2017 0.5  0.2 - 1.2 mg/dL Final  . Alkaline phosphatase (APISO) 05/11/2017 57  33 - 130 U/L Final  . AST 05/11/2017 16  10 - 35 U/L Final  . ALT 05/11/2017 13  6 - 29 U/L Final    Past Medical History:  Diagnosis Date  . Melanoma ([place])   . Right middle lobe syndrome    Past Surgical History:  Procedure Laterality Date  . TOTAL HIP ARTHROPLASTY  2003 and 2006   Bilateral   Current Outpatient Prescriptions on File Prior to Visit  Medication Sig Dispense Refill  . aspirin 81 MG tablet Take 81 mg by mouth daily.    . B Complex-C (SUPER B COMPLEX) TABS Take 1 tablet by mouth daily.    . Calcium Carb-Cholecalciferol (CALCIUM 1000 + D PO) Take 1 tablet by mouth daily.    . cholecalciferol (VITAMIN D) 1000 UNITS tablet Take 1,000 Units by mouth daily.    Marland Kitchen levofloxacin (LEVAQUIN) 750 MG tablet Take 1 tablet (750 mg total) by mouth daily. For 5 days 5 tablet 1  . Multiple Vitamins-Minerals (MULTI COMPLETE/IRON PO) Take 1 tablet by mouth daily.    . Omega-3 Fatty Acids (FISH OIL PO) Take 1 capsule by mouth daily.     No current facility-administered medications on file prior to visit.    No Known Allergies Social History   Social History  . Marital status: Widowed    Spouse name: N/A  . Number of children: 2  . Years of education: N/A   Occupational History  . Retired    Social History Main Topics  . Smoking status: Never Smoker  . Smokeless tobacco: Never Used  . Alcohol use Yes     Comment: Socially  . Drug use: No  . Sexual activity: Not on file   Other Topics Concern  . Not on file   Social History Narrative  . No narrative on file   Family History  Problem Relation Age of Onset  . Heart disease Father   . Breast cancer Mother       Review of Systems  All other systems reviewed and are negative.      Objective:   Physical Exam  Constitutional: She is oriented to person, place, and time. She appears well-developed and well-nourished. No  distress.  HENT:  Head: Normocephalic and atraumatic.  Right Ear: External ear normal.  Left Ear: External ear normal.  Nose: Nose normal.  Mouth/Throat: Oropharynx is clear and moist. No oropharyngeal exudate.  Eyes: Pupils are equal, round, and reactive to light. Conjunctivae and EOM are normal. Right eye exhibits no discharge.  Left eye exhibits no discharge. No scleral icterus.  Neck: Normal range of motion. Neck supple. No JVD present. No tracheal deviation present. No thyromegaly present.  Cardiovascular: Normal rate, regular rhythm, normal heart sounds and intact distal pulses.  Exam reveals no gallop and no friction rub.   No murmur heard. Pulmonary/Chest: Effort normal and breath sounds normal. No stridor. No respiratory distress. She has no wheezes. She has no rales. She exhibits no tenderness.  Abdominal: Soft. Bowel sounds are normal. She exhibits no distension and no mass. There is no tenderness. There is no rebound and no guarding.  Musculoskeletal: Normal range of motion. She exhibits no edema or tenderness.  Lymphadenopathy:    She has no cervical adenopathy.  Neurological: She is alert and oriented to person, place, and time. She has normal reflexes. No cranial nerve deficit. She exhibits normal muscle tone. Coordination normal.  Skin: Skin is warm. No rash noted. She is not diaphoretic. No erythema. No pallor.  Psychiatric: She has a normal mood and affect. Her behavior is normal. Judgment and thought content normal.  Vitals reviewed.         Assessment & Plan:  Encounter for hepatitis C screening test for low risk patient - Plan: Hepatitis C Antibody  Routine general medical examination at a health care facility   Patient's physical exam is completely normal. She received her flu shot today. She also received Pneumovax 23. The remainder of her preventative care is up-to-date. Her cancer screening is up-to-date. She denies any depression or falls. Will screen for  hepatitis C.  The remainder of her preventative care is performed at her gynecologist.  1200 mg of calcium and 1000 units of vit d daily.

## 2017-05-21 LAB — HEPATITIS C ANTIBODY
Hepatitis C Ab: NONREACTIVE
SIGNAL TO CUT-OFF: 0.01 (ref <1.00)

## 2017-07-21 LAB — HM PAP SMEAR: HM Pap smear: NEGATIVE

## 2017-08-16 ENCOUNTER — Encounter: Payer: Self-pay | Admitting: Family Medicine

## 2017-08-16 ENCOUNTER — Ambulatory Visit: Payer: Medicare Other | Admitting: Family Medicine

## 2017-08-16 VITALS — BP 130/74 | HR 98 | Temp 97.6°F | Resp 16 | Ht 65.0 in | Wt 155.0 lb

## 2017-08-16 DIAGNOSIS — M545 Low back pain, unspecified: Secondary | ICD-10-CM

## 2017-08-16 LAB — URINALYSIS, ROUTINE W REFLEX MICROSCOPIC
Bilirubin Urine: NEGATIVE
Glucose, UA: NEGATIVE
Ketones, ur: NEGATIVE
Nitrite: NEGATIVE
Protein, ur: NEGATIVE
Specific Gravity, Urine: 1.01 (ref 1.001–1.03)
Squamous Epithelial / LPF: NONE SEEN /HPF (ref <=5)
pH: 6 (ref 5.0–8.0)

## 2017-08-16 LAB — MICROSCOPIC MESSAGE

## 2017-08-16 MED ORDER — TAMSULOSIN HCL 0.4 MG PO CAPS: 0.4000 mg | ORAL_CAPSULE | Freq: Every day | ORAL | 0 refills | Status: DC

## 2017-08-16 MED ORDER — OXYCODONE-ACETAMINOPHEN 7.5-325 MG PO TABS: 1.0000 | ORAL_TABLET | ORAL | 0 refills | Status: DC | PRN

## 2017-08-16 MED ORDER — CIPROFLOXACIN HCL 500 MG PO TABS: 500.0000 mg | ORAL_TABLET | Freq: Two times a day (BID) | ORAL | 0 refills | Status: DC

## 2017-08-16 NOTE — Progress Notes (Signed)
Subjective:    Patient ID: Catherine Long, female    DOB: Sep 11, 1950, 67 y.o.   MRN: [id number]  HPI Symptoms began 2 days ago with sudden onset of colicky right posterior flank pain. There was no radiation but the pain was intense. Patient rated the pain as an 8 on a scale 1-10. The pain comes and goes in waves. She denies any hematuria. She denies any radiation of the pain. Symptoms sound classic for a kidney stone. Urinalysis today shows trace hematuria but +3 leukocyte esterase. She does report a foul odor in her urine denies any dysuria. She does report increased frequency Past Medical History:  Diagnosis Date  . Melanoma ([place])   . Right middle lobe syndrome    Past Surgical History:  Procedure Laterality Date  . TOTAL HIP ARTHROPLASTY  2003 and 2006   Bilateral   Current Outpatient Medications on File Prior to Visit  Medication Sig Dispense Refill  . aspirin 81 MG tablet Take 81 mg by mouth daily.    . B Complex-C (SUPER B COMPLEX) TABS Take 1 tablet by mouth daily.    . Calcium Carb-Cholecalciferol (CALCIUM 1000 + D PO) Take 1 tablet by mouth daily.    . cholecalciferol (VITAMIN D) 1000 UNITS tablet Take 1,000 Units by mouth daily.    . Multiple Vitamins-Minerals (MULTI COMPLETE/IRON PO) Take 1 tablet by mouth daily.    . Omega-3 Fatty Acids (FISH OIL PO) Take 1 capsule by mouth daily.    Marland Kitchen triamcinolone cream (KENALOG) 0.1 % APPLY TO ACTIVE RASH ON HAND TWICE DAILY  0  . vitamin C (ASCORBIC ACID) 500 MG tablet Take 500 mg by mouth daily.     No current facility-administered medications on file prior to visit.    No Known Allergies Social History   Socioeconomic History  . Marital status: Widowed    Spouse name: Not on file  . Number of children: 2  . Years of education: Not on file  . Highest education level: Not on file  Social Needs  . Financial resource strain: Not on file  . Food insecurity - worry: Not on file  . Food insecurity - inability: Not on file  .  Transportation needs - medical: Not on file  . Transportation needs - non-medical: Not on file  Occupational History  . Occupation: Retired  Tobacco Use  . Smoking status: Never Smoker  . Smokeless tobacco: Never Used  Substance and Sexual Activity  . Alcohol use: Yes    Comment: Socially  . Drug use: No  . Sexual activity: Not on file  Other Topics Concern  . Not on file  Social History Narrative  . Not on file      Review of Systems  All other systems reviewed and are negative.      Objective:   Physical Exam  Cardiovascular: Normal rate, regular rhythm and normal heart sounds.  Pulmonary/Chest: Effort normal and breath sounds normal. No respiratory distress. She has no wheezes. She has no rales.  Abdominal: Soft. Bowel sounds are normal. She exhibits no distension. There is no tenderness. There is no guarding.  Musculoskeletal: Normal range of motion. She exhibits no tenderness.  Vitals reviewed.         Assessment & Plan:  Bilateral low back pain without sciatica, unspecified chronicity - Plan: Urinalysis, Routine w reflex microscopic  Her pain does not sound muscular. She has normal range of motion without pain. Differential diagnosis includes nephrolithiasis versus urinary tract infection.  Story is consistent with a kidney stone but urinalysis is consistent with urinary tract infection. Therefore I will treat both. Begin Cipro 500 mg by mouth twice a day for 5 days. Use Flomax 0.4 mg by mouth daily at bedtime until pain improves. Use Percocet 1 tablet every 6 hours as needed for severe pain. Push fluids. Reassess in 48 hours. If no better, obtain CAT scan to evaluate further and confirm the presence of a kidney stone and determine its size. If worsening, go to the emergency room for urgent imaging.  If better, no further workup is necessary patient would've had an atypical urinary tract infection

## 2017-08-16 NOTE — Addendum Note (Signed)
Addended by: Shary Decamp B on: 08/16/2017 03:33 PM   Modules accepted: Orders

## 2017-08-19 LAB — URINE CULTURE
MICRO NUMBER:: 90055734
SPECIMEN QUALITY:: ADEQUATE

## 2017-08-20 ENCOUNTER — Encounter: Payer: Self-pay | Admitting: Family Medicine

## 2017-08-30 ENCOUNTER — Encounter: Payer: Self-pay | Admitting: Family Medicine

## 2018-04-27 ENCOUNTER — Other Ambulatory Visit: Payer: Medicare Other

## 2018-05-02 ENCOUNTER — Encounter: Payer: Medicare Other | Admitting: Family Medicine

## 2018-05-17 ENCOUNTER — Other Ambulatory Visit: Payer: Medicare Other

## 2018-05-17 DIAGNOSIS — Z0000 Encounter for general adult medical examination without abnormal findings: Secondary | ICD-10-CM

## 2018-05-18 ENCOUNTER — Other Ambulatory Visit: Payer: Medicare Other

## 2018-05-18 LAB — CBC WITH DIFFERENTIAL/PLATELET
Basophils Absolute: 98 cells/uL (ref 0–200)
Basophils Relative: 1.3 %
Eosinophils Absolute: 158 cells/uL (ref 15–500)
Eosinophils Relative: 2.1 %
HCT: 40.9 % (ref 35.0–45.0)
Hemoglobin: 13.5 g/dL (ref 11.7–15.5)
Lymphs Abs: 2085 cells/uL (ref 850–3900)
MCH: 28.6 pg (ref 27.0–33.0)
MCHC: 33 g/dL (ref 32.0–36.0)
MCV: 86.7 fL (ref 80.0–100.0)
MPV: 9.6 fL (ref 7.5–12.5)
Monocytes Relative: 6.1 %
Neutro Abs: 4703 cells/uL (ref 1500–7800)
Neutrophils Relative %: 62.7 %
Platelets: 308 10*3/uL (ref 140–400)
RBC: 4.72 10*6/uL (ref 3.80–5.10)
RDW: 13.3 % (ref 11.0–15.0)
Total Lymphocyte: 27.8 %
WBC mixed population: 458 cells/uL (ref 200–950)
WBC: 7.5 10*3/uL (ref 3.8–10.8)

## 2018-05-18 LAB — COMPREHENSIVE METABOLIC PANEL
AG Ratio: 1.5 (calc) (ref 1.0–2.5)
ALT: 17 U/L (ref 6–29)
AST: 18 U/L (ref 10–35)
Albumin: 4.2 g/dL (ref 3.6–5.1)
Alkaline phosphatase (APISO): 63 U/L (ref 33–130)
BUN: 9 mg/dL (ref 7–25)
CO2: 29 mmol/L (ref 20–32)
Calcium: 9.6 mg/dL (ref 8.6–10.4)
Chloride: 103 mmol/L (ref 98–110)
Creat: 0.75 mg/dL (ref 0.50–0.99)
Globulin: 2.8 g/dL (calc) (ref 1.9–3.7)
Glucose, Bld: 86 mg/dL (ref 65–99)
Potassium: 4.8 mmol/L (ref 3.5–5.3)
Sodium: 140 mmol/L (ref 135–146)
Total Bilirubin: 0.5 mg/dL (ref 0.2–1.2)
Total Protein: 7 g/dL (ref 6.1–8.1)

## 2018-05-18 LAB — LIPID PANEL
Cholesterol: 155 mg/dL (ref <200)
HDL: 66 mg/dL (ref >50)
LDL Cholesterol (Calc): 65 mg/dL (calc)
Non-HDL Cholesterol (Calc): 89 mg/dL (calc) (ref <130)
Total CHOL/HDL Ratio: 2.3 (calc) (ref <5.0)
Triglycerides: 166 mg/dL — ABNORMAL HIGH (ref <150)

## 2018-05-18 LAB — TSH: TSH: 2.38 mIU/L (ref 0.40–4.50)

## 2018-05-23 ENCOUNTER — Ambulatory Visit (INDEPENDENT_AMBULATORY_CARE_PROVIDER_SITE_OTHER): Payer: Medicare Other | Admitting: Family Medicine

## 2018-05-23 ENCOUNTER — Encounter: Payer: Self-pay | Admitting: Family Medicine

## 2018-05-23 VITALS — BP 120/68 | HR 73 | Temp 98.0°F | Resp 12 | Ht 65.0 in | Wt 153.0 lb

## 2018-05-23 DIAGNOSIS — Z0000 Encounter for general adult medical examination without abnormal findings: Secondary | ICD-10-CM

## 2018-05-23 DIAGNOSIS — Z23 Encounter for immunization: Secondary | ICD-10-CM

## 2018-05-23 DIAGNOSIS — R300 Dysuria: Secondary | ICD-10-CM | POA: Diagnosis not present

## 2018-05-23 LAB — URINALYSIS, ROUTINE W REFLEX MICROSCOPIC
Bilirubin Urine: NEGATIVE
Glucose, UA: NEGATIVE
Hgb urine dipstick: NEGATIVE
Ketones, ur: NEGATIVE
Leukocytes, UA: NEGATIVE
Nitrite: NEGATIVE
Protein, ur: NEGATIVE
Specific Gravity, Urine: 1.015 (ref 1.001–1.03)
pH: 7 (ref 5.0–8.0)

## 2018-05-23 NOTE — Addendum Note (Signed)
Addended by: Shary Decamp B on: 05/23/2018 04:25 PM   Modules accepted: Orders

## 2018-05-23 NOTE — Progress Notes (Signed)
Subjective:    Patient ID: Catherine Long, female    DOB: 10/23/1950, 67 y.o.   MRN: [id number]  HPI Patient is a very pleasant 67 year old Caucasian female here today for complete physical exam.  She also reports some mild pelvic discomfort and increased urinary frequency over the last few days.  She is concerned that she may have a urinary tract infection.  She denies any dysuria.  Urinalysis today is completely normal.  She denies any constipation or diarrhea or melena or hematochezia.  Colonoscopy was performed in 2016 and is due again in 10 years.  Mammogram and Pap smear are performed at her gynecologist.  She had a mammogram last year in 2018 although I have no records of this.  She is scheduled to see her gynecologist this year in December where they will perform her Pap smear and mammogram as well.  Her bone density is up-to-date.  She is taking calcium and vitamin D.  She denies any problems with falls, depression, but does report some ringing in the ears and some mild hearing loss.  However she states that this does not bother her and she does not request to see an audiologist at the present time Immunization History  Administered Date(s) Administered  . Influenza Split 04/26/2012  . Influenza, High Dose Seasonal PF 05/20/2017  . Influenza,inj,Quad PF,6+ Mos 06/01/2013, 05/28/2014, 05/15/2016  . Pneumococcal Conjugate-13 05/15/2016  . Pneumococcal Polysaccharide-23 04/26/2012  . Zoster 05/30/2014  . Zoster Recombinat (Shingrix) 10/05/2017, 12/26/2017   Her most recent lab work as listed below and is excellent: Lab on 05/17/2018  Component Date Value Ref Range Status  . WBC 05/17/2018 7.5  3.8 - 10.8 Thousand/uL Final  . RBC 05/17/2018 4.72  3.80 - 5.10 Million/uL Final  . Hemoglobin 05/17/2018 13.5  11.7 - 15.5 g/dL Final  . HCT 05/17/2018 40.9  35.0 - 45.0 % Final  . MCV 05/17/2018 86.7  80.0 - 100.0 fL Final  . MCH 05/17/2018 28.6  27.0 - 33.0 pg Final  . MCHC 05/17/2018 33.0   32.0 - 36.0 g/dL Final  . RDW 05/17/2018 13.3  11.0 - 15.0 % Final  . Platelets 05/17/2018 308  140 - 400 Thousand/uL Final  . MPV 05/17/2018 9.6  7.5 - 12.5 fL Final  . Neutro Abs 05/17/2018 4,703  1,500 - 7,800 cells/uL Final  . Lymphs Abs 05/17/2018 2,085  850 - 3,900 cells/uL Final  . WBC mixed population 05/17/2018 458  200 - 950 cells/uL Final  . Eosinophils Absolute 05/17/2018 158  15 - 500 cells/uL Final  . Basophils Absolute 05/17/2018 98  0 - 200 cells/uL Final  . Neutrophils Relative % 05/17/2018 62.7  % Final  . Total Lymphocyte 05/17/2018 27.8  % Final  . Monocytes Relative 05/17/2018 6.1  % Final  . Eosinophils Relative 05/17/2018 2.1  % Final  . Basophils Relative 05/17/2018 1.3  % Final  . Cholesterol 05/17/2018 155  <200 mg/dL Final  . HDL 05/17/2018 66  >50 mg/dL Final  . Triglycerides 05/17/2018 166* <150 mg/dL Final  . LDL Cholesterol (Calc) 05/17/2018 65  mg/dL (calc) Final   Comment: Reference range: <100 . Desirable range <100 mg/dL for primary prevention;   <70 mg/dL for patients with CHD or diabetic patients  with > or = 2 CHD risk factors. Marland Kitchen LDL-C is now calculated using the Martin-Hopkins  calculation, which is a validated novel method providing  better accuracy than the Friedewald equation in the  estimation of LDL-C.  Hassell Done  SS et al. JAMA. 5956;387(56): 2061-2068  (http://education.[facility].com/faq/FAQ164)   . Total CHOL/HDL Ratio 05/17/2018 2.3  <5.0 (calc) Final  . Non-HDL Cholesterol (Calc) 05/17/2018 89  <130 mg/dL (calc) Final   Comment: For patients with diabetes plus 1 major ASCVD risk  factor, treating to a non-HDL-C goal of <100 mg/dL  (LDL-C of <70 mg/dL) is considered a therapeutic  option.   . Glucose, Bld 05/17/2018 86  65 - 99 mg/dL Final   Comment: .            Fasting reference interval .   . BUN 05/17/2018 9  7 - 25 mg/dL Final  . Creat 05/17/2018 0.75  0.50 - 0.99 mg/dL Final   Comment: For patients >29 years of age,  the reference limit for Creatinine is approximately 13% higher for people identified as African-American. .   Havery Moros Ratio 43/32/9518 NOT APPLICABLE  6 - 22 (calc) Final  . Sodium 05/17/2018 140  135 - 146 mmol/L Final  . Potassium 05/17/2018 4.8  3.5 - 5.3 mmol/L Final  . Chloride 05/17/2018 103  98 - 110 mmol/L Final  . CO2 05/17/2018 29  20 - 32 mmol/L Final  . Calcium 05/17/2018 9.6  8.6 - 10.4 mg/dL Final  . Total Protein 05/17/2018 7.0  6.1 - 8.1 g/dL Final  . Albumin 05/17/2018 4.2  3.6 - 5.1 g/dL Final  . Globulin 05/17/2018 2.8  1.9 - 3.7 g/dL (calc) Final  . AG Ratio 05/17/2018 1.5  1.0 - 2.5 (calc) Final  . Total Bilirubin 05/17/2018 0.5  0.2 - 1.2 mg/dL Final  . Alkaline phosphatase (APISO) 05/17/2018 63  33 - 130 U/L Final  . AST 05/17/2018 18  10 - 35 U/L Final  . ALT 05/17/2018 17  6 - 29 U/L Final  . TSH 05/17/2018 2.38  0.40 - 4.50 mIU/L Final    Past Medical History:  Diagnosis Date  . Melanoma ([place])   . Right middle lobe syndrome    Past Surgical History:  Procedure Laterality Date  . TOTAL HIP ARTHROPLASTY  2003 and 2006   Bilateral   Current Outpatient Medications on File Prior to Visit  Medication Sig Dispense Refill  . aspirin 81 MG tablet Take 81 mg by mouth daily.    . B Complex-C (SUPER B COMPLEX) TABS Take 1 tablet by mouth daily.    . Calcium Carb-Cholecalciferol (CALCIUM 1000 + D PO) Take 1 tablet by mouth daily. Calcium 1200 mg qd    . cholecalciferol (VITAMIN D) 1000 UNITS tablet Take 1,000 Units by mouth daily.    . Multiple Vitamins-Minerals (MULTI COMPLETE/IRON PO) Take 1 tablet by mouth daily.    . Omega-3 Fatty Acids (FISH OIL PO) Take 1 capsule by mouth daily.    Marland Kitchen triamcinolone cream (KENALOG) 0.1 % Apply 1 application topically as needed.   0  . vitamin C (ASCORBIC ACID) 500 MG tablet Take 500 mg by mouth daily.     No current facility-administered medications on file prior to visit.    No Known Allergies Social History    Socioeconomic History  . Marital status: Widowed    Spouse name: Not on file  . Number of children: 2  . Years of education: Not on file  . Highest education level: Not on file  Occupational History  . Occupation: Retired  Scientific laboratory technician  . Financial resource strain: Not on file  . Food insecurity:    Worry: Not on file    Inability: Not  on file  . Transportation needs:    Medical: Not on file    Non-medical: Not on file  Tobacco Use  . Smoking status: Never Smoker  . Smokeless tobacco: Never Used  Substance and Sexual Activity  . Alcohol use: Yes    Comment: Socially  . Drug use: No  . Sexual activity: Not on file  Lifestyle  . Physical activity:    Days per week: Not on file    Minutes per session: Not on file  . Stress: Not on file  Relationships  . Social connections:    Talks on phone: Not on file    Gets together: Not on file    Attends religious service: Not on file    Active member of club or organization: Not on file    Attends meetings of clubs or organizations: Not on file    Relationship status: Not on file  . Intimate partner violence:    Fear of current or ex partner: Not on file    Emotionally abused: Not on file    Physically abused: Not on file    Forced sexual activity: Not on file  Other Topics Concern  . Not on file  Social History Narrative  . Not on file   Family History  Problem Relation Age of Onset  . Heart disease Father   . Breast cancer Mother       Review of Systems  All other systems reviewed and are negative.      Objective:   Physical Exam  Constitutional: She is oriented to person, place, and time. She appears well-developed and well-nourished. No distress.  HENT:  Head: Normocephalic and atraumatic.  Right Ear: External ear normal.  Left Ear: External ear normal.  Nose: Nose normal.  Mouth/Throat: Oropharynx is clear and moist. No oropharyngeal exudate.  Eyes: Pupils are equal, round, and reactive to light.  Conjunctivae and EOM are normal. Right eye exhibits no discharge. Left eye exhibits no discharge. No scleral icterus.  Neck: Normal range of motion. Neck supple. No JVD present. No tracheal deviation present. No thyromegaly present.  Cardiovascular: Normal rate, regular rhythm, normal heart sounds and intact distal pulses. Exam reveals no gallop and no friction rub.  No murmur heard. Pulmonary/Chest: Effort normal and breath sounds normal. No stridor. No respiratory distress. She has no wheezes. She has no rales. She exhibits no tenderness.  Abdominal: Soft. Bowel sounds are normal. She exhibits no distension and no mass. There is no tenderness. There is no rebound and no guarding.  Musculoskeletal: Normal range of motion. She exhibits no edema or tenderness.  Lymphadenopathy:    She has no cervical adenopathy.  Neurological: She is alert and oriented to person, place, and time. She has normal reflexes. No cranial nerve deficit. She exhibits normal muscle tone. Coordination normal.  Skin: Skin is warm. No rash noted. She is not diaphoretic. No erythema. No pallor.  Psychiatric: She has a normal mood and affect. Her behavior is normal. Judgment and thought content normal.  Vitals reviewed.         Assessment & Plan:  Dysuria - Plan: Urinalysis, Routine w reflex microscopic  Routine general medical examination at a health care facility   Physical exam today is completely normal.  I reviewed her lab work which is outstanding.  She received Pneumovax 23 today to update her pneumococcal series.  She also received her flu shot.  She politely declined her tetanus shot.  Colonoscopy, Pap smear, and mammogram are  up-to-date.  I have asked her to get records from her gynecologist so that we can update her records here.  The remainder of her preventative care is up-to-date.  Urinalysis is normal.  No changes made at the present time.

## 2019-05-19 ENCOUNTER — Other Ambulatory Visit: Payer: Self-pay

## 2019-05-19 ENCOUNTER — Other Ambulatory Visit: Payer: Medicare Other

## 2019-05-19 DIAGNOSIS — Z0000 Encounter for general adult medical examination without abnormal findings: Secondary | ICD-10-CM

## 2019-05-20 LAB — CBC WITH DIFFERENTIAL/PLATELET
Absolute Monocytes: 458 cells/uL (ref 200–950)
Basophils Absolute: 71 cells/uL (ref 0–200)
Basophils Relative: 0.9 %
Eosinophils Absolute: 119 cells/uL (ref 15–500)
Eosinophils Relative: 1.5 %
HCT: 40.1 % (ref 35.0–45.0)
Hemoglobin: 13.3 g/dL (ref 11.7–15.5)
Lymphs Abs: 2449 cells/uL (ref 850–3900)
MCH: 30 pg (ref 27.0–33.0)
MCHC: 33.2 g/dL (ref 32.0–36.0)
MCV: 90.3 fL (ref 80.0–100.0)
MPV: 9.7 fL (ref 7.5–12.5)
Monocytes Relative: 5.8 %
Neutro Abs: 4803 cells/uL (ref 1500–7800)
Neutrophils Relative %: 60.8 %
Platelets: 340 10*3/uL (ref 140–400)
RBC: 4.44 10*6/uL (ref 3.80–5.10)
RDW: 12.5 % (ref 11.0–15.0)
Total Lymphocyte: 31 %
WBC: 7.9 10*3/uL (ref 3.8–10.8)

## 2019-05-20 LAB — COMPREHENSIVE METABOLIC PANEL
AG Ratio: 1.6 (calc) (ref 1.0–2.5)
ALT: 12 U/L (ref 6–29)
AST: 18 U/L (ref 10–35)
Albumin: 4.1 g/dL (ref 3.6–5.1)
Alkaline phosphatase (APISO): 58 U/L (ref 37–153)
BUN: 13 mg/dL (ref 7–25)
CO2: 26 mmol/L (ref 20–32)
Calcium: 9.7 mg/dL (ref 8.6–10.4)
Chloride: 101 mmol/L (ref 98–110)
Creat: 0.77 mg/dL (ref 0.50–0.99)
Globulin: 2.5 g/dL (calc) (ref 1.9–3.7)
Glucose, Bld: 94 mg/dL (ref 65–99)
Potassium: 5.2 mmol/L (ref 3.5–5.3)
Sodium: 139 mmol/L (ref 135–146)
Total Bilirubin: 0.5 mg/dL (ref 0.2–1.2)
Total Protein: 6.6 g/dL (ref 6.1–8.1)

## 2019-05-20 LAB — LIPID PANEL
Cholesterol: 143 mg/dL (ref <200)
HDL: 67 mg/dL (ref >=50)
LDL Cholesterol (Calc): 56 mg/dL (calc)
Non-HDL Cholesterol (Calc): 76 mg/dL (calc) (ref <130)
Total CHOL/HDL Ratio: 2.1 (calc) (ref <5.0)
Triglycerides: 121 mg/dL (ref <150)

## 2019-05-26 ENCOUNTER — Ambulatory Visit (INDEPENDENT_AMBULATORY_CARE_PROVIDER_SITE_OTHER): Payer: Medicare Other | Admitting: Family Medicine

## 2019-05-26 ENCOUNTER — Other Ambulatory Visit: Payer: Self-pay

## 2019-05-26 VITALS — BP 130/88 | HR 82 | Temp 98.0°F | Resp 18 | Ht 63.0 in | Wt 153.0 lb

## 2019-05-26 DIAGNOSIS — Z0000 Encounter for general adult medical examination without abnormal findings: Secondary | ICD-10-CM

## 2019-05-26 NOTE — Progress Notes (Signed)
Subjective:    Patient ID: Catherine Long, female    DOB: February 10, 1951, 68 y.o.   MRN: [id number]  HPI Patient is a very pleasant 68 year old white female who is here today for complete physical exam.  Patient had a bone density scan in December 2018 which showed a T score of -1.7 in the spine showing osteopenia.  She has already scheduled an appointment with her gynecologist for December.  They will be repeating her bone density at that time.  She had a mammogram in December of last year.  This is being repeated in December of this year.  She denies any issues with falls, memory loss, or severe depression. Immunization History  Administered Date(s) Administered  . Influenza Split 04/26/2012  . Influenza, High Dose Seasonal PF 05/20/2017, 05/23/2018  . Influenza,inj,Quad PF,6+ Mos 06/01/2013, 05/28/2014, 05/15/2016  . Pneumococcal Conjugate-13 05/15/2016  . Pneumococcal Polysaccharide-23 04/26/2012, 05/23/2018  . Zoster 05/30/2014  . Zoster Recombinat (Shingrix) 10/05/2017, 12/26/2017   Her most recent lab work as listed below and is excellent: Lab on 05/19/2019  Component Date Value Ref Range Status  . Cholesterol 05/19/2019 143  <200 mg/dL Final  . HDL 05/19/2019 67  > OR = 50 mg/dL Final  . Triglycerides 05/19/2019 121  <150 mg/dL Final  . LDL Cholesterol (Calc) 05/19/2019 56  mg/dL (calc) Final   Comment: Reference range: <100 . Desirable range <100 mg/dL for primary prevention;   <70 mg/dL for patients with CHD or diabetic patients  with > or = 2 CHD risk factors. Marland Kitchen LDL-C is now calculated using the Martin-Hopkins  calculation, which is a validated novel method providing  better accuracy than the Friedewald equation in the  estimation of LDL-C.  Cresenciano Genre et al. Annamaria Helling. MU:[id number]): 2061-2068  (http://education.[facility].com/faq/FAQ164)   . Total CHOL/HDL Ratio 05/19/2019 2.1  <5.0 (calc) Final  . Non-HDL Cholesterol (Calc) 05/19/2019 76  <130 mg/dL (calc) Final   Comment: For patients with diabetes plus 1 major ASCVD risk  factor, treating to a non-HDL-C goal of <100 mg/dL  (LDL-C of <70 mg/dL) is considered a therapeutic  option.   . Glucose, Bld 05/19/2019 94  65 - 99 mg/dL Final   Comment: .            Fasting reference interval .   . BUN 05/19/2019 13  7 - 25 mg/dL Final  . Creat 05/19/2019 0.77  0.50 - 0.99 mg/dL Final   Comment: For patients >43 years of age, the reference limit for Creatinine is approximately 13% higher for people identified as African-American. .   Havery Moros Ratio [id number] NOT APPLICABLE  6 - 22 (calc) Final  . Sodium 05/19/2019 139  135 - 146 mmol/L Final  . Potassium 05/19/2019 5.2  3.5 - 5.3 mmol/L Final  . Chloride 05/19/2019 101  98 - 110 mmol/L Final  . CO2 05/19/2019 26  20 - 32 mmol/L Final  . Calcium 05/19/2019 9.7  8.6 - 10.4 mg/dL Final  . Total Protein 05/19/2019 6.6  6.1 - 8.1 g/dL Final  . Albumin 05/19/2019 4.1  3.6 - 5.1 g/dL Final  . Globulin 05/19/2019 2.5  1.9 - 3.7 g/dL (calc) Final  . AG Ratio 05/19/2019 1.6  1.0 - 2.5 (calc) Final  . Total Bilirubin 05/19/2019 0.5  0.2 - 1.2 mg/dL Final  . Alkaline phosphatase (APISO) 05/19/2019 58  37 - 153 U/L Final  . AST 05/19/2019 18  10 - 35 U/L Final  . ALT 05/19/2019 12  6 -  29 U/L Final  . WBC 05/19/2019 7.9  3.8 - 10.8 Thousand/uL Final  . RBC 05/19/2019 4.44  3.80 - 5.10 Million/uL Final  . Hemoglobin 05/19/2019 13.3  11.7 - 15.5 g/dL Final  . HCT 05/19/2019 40.1  35.0 - 45.0 % Final  . MCV 05/19/2019 90.3  80.0 - 100.0 fL Final  . MCH 05/19/2019 30.0  27.0 - 33.0 pg Final  . MCHC 05/19/2019 33.2  32.0 - 36.0 g/dL Final  . RDW 05/19/2019 12.5  11.0 - 15.0 % Final  . Platelets 05/19/2019 340  140 - 400 Thousand/uL Final  . MPV 05/19/2019 9.7  7.5 - 12.5 fL Final  . Neutro Abs 05/19/2019 4,803  1,500 - 7,800 cells/uL Final  . Lymphs Abs 05/19/2019 2,449  850 - 3,900 cells/uL Final  . Absolute Monocytes 05/19/2019 458  200 - 950  cells/uL Final  . Eosinophils Absolute 05/19/2019 119  15 - 500 cells/uL Final  . Basophils Absolute 05/19/2019 71  0 - 200 cells/uL Final  . Neutrophils Relative % 05/19/2019 60.8  % Final  . Total Lymphocyte 05/19/2019 31.0  % Final  . Monocytes Relative 05/19/2019 5.8  % Final  . Eosinophils Relative 05/19/2019 1.5  % Final  . Basophils Relative 05/19/2019 0.9  % Final    Past Medical History:  Diagnosis Date  . Melanoma ([place])   . Right middle lobe syndrome    Past Surgical History:  Procedure Laterality Date  . TOTAL HIP ARTHROPLASTY  2003 and 2006   Bilateral   Current Outpatient Medications on File Prior to Visit  Medication Sig Dispense Refill  . aspirin 81 MG tablet Take 81 mg by mouth daily.    . B Complex-C (SUPER B COMPLEX) TABS Take 1 tablet by mouth daily.    . Calcium Carb-Cholecalciferol (CALCIUM 1000 + D PO) Take 1 tablet by mouth daily. Calcium 1200 mg qd    . cholecalciferol (VITAMIN D) 1000 UNITS tablet Take 1,000 Units by mouth daily.    . Multiple Vitamins-Minerals (MULTI COMPLETE/IRON PO) Take 1 tablet by mouth daily.    . Omega-3 Fatty Acids (FISH OIL PO) Take 1 capsule by mouth daily.    Marland Kitchen triamcinolone cream (KENALOG) 0.1 % Apply 1 application topically as needed.   0  . vitamin C (ASCORBIC ACID) 500 MG tablet Take 500 mg by mouth daily.     No current facility-administered medications on file prior to visit.    No Known Allergies Social History   Socioeconomic History  . Marital status: Widowed    Spouse name: Not on file  . Number of children: 2  . Years of education: Not on file  . Highest education level: Not on file  Occupational History  . Occupation: Retired  Scientific laboratory technician  . Financial resource strain: Not on file  . Food insecurity    Worry: Not on file    Inability: Not on file  . Transportation needs    Medical: Not on file    Non-medical: Not on file  Tobacco Use  . Smoking status: Never Smoker  . Smokeless tobacco: Never Used   Substance and Sexual Activity  . Alcohol use: Yes    Comment: Socially  . Drug use: No  . Sexual activity: Not on file  Lifestyle  . Physical activity    Days per week: Not on file    Minutes per session: Not on file  . Stress: Not on file  Relationships  . Social connections  Talks on phone: Not on file    Gets together: Not on file    Attends religious service: Not on file    Active member of club or organization: Not on file    Attends meetings of clubs or organizations: Not on file    Relationship status: Not on file  . Intimate partner violence    Fear of current or ex partner: Not on file    Emotionally abused: Not on file    Physically abused: Not on file    Forced sexual activity: Not on file  Other Topics Concern  . Not on file  Social History Narrative  . Not on file   Family History  Problem Relation Age of Onset  . Heart disease Father   . Breast cancer Mother       Review of Systems  All other systems reviewed and are negative.      Objective:   Physical Exam  Constitutional: She is oriented to person, place, and time. She appears well-developed and well-nourished. No distress.  HENT:  Head: Normocephalic and atraumatic.  Right Ear: External ear normal.  Left Ear: External ear normal.  Nose: Nose normal.  Mouth/Throat: Oropharynx is clear and moist. No oropharyngeal exudate.  Eyes: Pupils are equal, round, and reactive to light. Conjunctivae and EOM are normal. Right eye exhibits no discharge. Left eye exhibits no discharge. No scleral icterus.  Neck: Normal range of motion. Neck supple. No JVD present. No tracheal deviation present. No thyromegaly present.  Cardiovascular: Normal rate, regular rhythm, normal heart sounds and intact distal pulses. Exam reveals no gallop and no friction rub.  No murmur heard. Pulmonary/Chest: Effort normal and breath sounds normal. No stridor. No respiratory distress. She has no wheezes. She has no rales. She  exhibits no tenderness.  Abdominal: Soft. Bowel sounds are normal. She exhibits no distension and no mass. There is no abdominal tenderness. There is no rebound and no guarding.  Musculoskeletal: Normal range of motion.        General: No tenderness or edema.  Lymphadenopathy:    She has no cervical adenopathy.  Neurological: She is alert and oriented to person, place, and time. She has normal reflexes. No cranial nerve deficit. She exhibits normal muscle tone. Coordination normal.  Skin: Skin is warm. No rash noted. She is not diaphoretic. No erythema. No pallor.  Psychiatric: She has a normal mood and affect. Her behavior is normal. Judgment and thought content normal.  Vitals reviewed.         Assessment & Plan:  Routine general medical examination at a health care facility  Physical exam today is completely normal except for varicose veins particularly worse in her left leg.  Otherwise her exam is excellent.  I recommended the flu shot at her earliest convenience.  She would like to come back and get this later.  The remainder of her immunizations are up-to-date.  She will get her mammogram at her gynecologist office.  Her colonoscopy was last performed in 2016 and is due in 2026.  Her bone density will be performed this fall at her gynecologist office.  I reviewed her lab work with her which was outstanding.  She denies any issues with falls, memory loss, or depression.

## 2019-06-20 ENCOUNTER — Ambulatory Visit (INDEPENDENT_AMBULATORY_CARE_PROVIDER_SITE_OTHER): Payer: Medicare Other

## 2019-06-20 DIAGNOSIS — Z23 Encounter for immunization: Secondary | ICD-10-CM | POA: Diagnosis not present

## 2019-08-14 DIAGNOSIS — M8588 Other specified disorders of bone density and structure, other site: Secondary | ICD-10-CM | POA: Diagnosis not present

## 2019-08-14 DIAGNOSIS — Z1231 Encounter for screening mammogram for malignant neoplasm of breast: Secondary | ICD-10-CM | POA: Diagnosis not present

## 2019-08-14 DIAGNOSIS — Z6827 Body mass index (BMI) 27.0-27.9, adult: Secondary | ICD-10-CM | POA: Diagnosis not present

## 2019-08-14 DIAGNOSIS — Z01419 Encounter for gynecological examination (general) (routine) without abnormal findings: Secondary | ICD-10-CM | POA: Diagnosis not present

## 2019-08-14 DIAGNOSIS — N958 Other specified menopausal and perimenopausal disorders: Secondary | ICD-10-CM | POA: Diagnosis not present

## 2019-08-16 ENCOUNTER — Other Ambulatory Visit: Payer: Self-pay | Admitting: Obstetrics and Gynecology

## 2019-08-16 DIAGNOSIS — R928 Other abnormal and inconclusive findings on diagnostic imaging of breast: Secondary | ICD-10-CM

## 2019-08-25 ENCOUNTER — Ambulatory Visit
Admission: RE | Admit: 2019-08-25 | Discharge: 2019-08-25 | Disposition: A | Discharge status: Home / Self Care | Payer: Medicare Other | Source: Ambulatory Visit | Attending: Obstetrics and Gynecology | Admitting: Obstetrics and Gynecology

## 2019-08-25 ENCOUNTER — Other Ambulatory Visit: Payer: Self-pay | Admitting: Obstetrics and Gynecology

## 2019-08-25 ENCOUNTER — Other Ambulatory Visit: Payer: Self-pay

## 2019-08-25 ENCOUNTER — Ambulatory Visit
Admission: RE | Admit: 2019-08-25 | Discharge: 2019-08-25 | Disposition: A | Discharge status: Home / Self Care | Payer: Medicare PPO | Source: Ambulatory Visit | Attending: Obstetrics and Gynecology | Admitting: Obstetrics and Gynecology

## 2019-08-25 DIAGNOSIS — R922 Inconclusive mammogram: Secondary | ICD-10-CM | POA: Diagnosis not present

## 2019-08-25 DIAGNOSIS — R928 Other abnormal and inconclusive findings on diagnostic imaging of breast: Secondary | ICD-10-CM

## 2019-08-25 DIAGNOSIS — N6489 Other specified disorders of breast: Secondary | ICD-10-CM | POA: Diagnosis not present

## 2019-08-25 DIAGNOSIS — N6310 Unspecified lump in the right breast, unspecified quadrant: Secondary | ICD-10-CM

## 2019-08-25 DIAGNOSIS — N6312 Unspecified lump in the right breast, upper inner quadrant: Secondary | ICD-10-CM | POA: Diagnosis not present

## 2019-09-01 ENCOUNTER — Ambulatory Visit
Admission: RE | Admit: 2019-09-01 | Discharge: 2019-09-01 | Disposition: A | Discharge status: Home / Self Care | Payer: Medicare PPO | Source: Ambulatory Visit | Attending: Obstetrics and Gynecology | Admitting: Obstetrics and Gynecology

## 2019-09-01 ENCOUNTER — Other Ambulatory Visit: Payer: Self-pay

## 2019-09-01 DIAGNOSIS — N6310 Unspecified lump in the right breast, unspecified quadrant: Secondary | ICD-10-CM

## 2019-09-01 DIAGNOSIS — C50211 Malignant neoplasm of upper-inner quadrant of right female breast: Secondary | ICD-10-CM | POA: Diagnosis not present

## 2019-09-01 DIAGNOSIS — N6312 Unspecified lump in the right breast, upper inner quadrant: Secondary | ICD-10-CM | POA: Diagnosis not present

## 2019-09-04 ENCOUNTER — Encounter: Payer: Self-pay | Admitting: *Deleted

## 2019-09-07 ENCOUNTER — Other Ambulatory Visit: Payer: Self-pay | Admitting: *Deleted

## 2019-09-07 DIAGNOSIS — C50211 Malignant neoplasm of upper-inner quadrant of right female breast: Secondary | ICD-10-CM | POA: Insufficient documentation

## 2019-09-10 ENCOUNTER — Ambulatory Visit: Payer: Medicare PPO | Attending: Internal Medicine

## 2019-09-10 DIAGNOSIS — Z23 Encounter for immunization: Secondary | ICD-10-CM

## 2019-09-10 NOTE — Progress Notes (Signed)
   Covid-[facility] Vaccination [facility]  Name:  Catherine Long    MRN: [id number] DOB: [birth year]  09/10/2019  Catherine Long was observed post Covid-19 immunization for 15 minutes without incidence. She was provided with Vaccine Information Sheet and instruction to access the V-Safe system.   Catherine Long was instructed to call 911 with any severe reactions post vaccine: Marland Kitchen Difficulty breathing  . Swelling of your face and throat  . A fast heartbeat  . A bad rash all over your body  . Dizziness and weakness    Immunizations Administered    Name Date Dose VIS Date Route   Pfizer COVID-19 Vaccine 09/10/2019  2:56 PM 0.3 mL 07/14/2019 Intramuscular   Manufacturer: [employer]   Lot: [id number]   [place]: [id number]

## 2019-09-12 NOTE — Progress Notes (Signed)
[facility] NOTE  Patient Care Team: [name], MD as PCP - General (Family Medicine) [name] as Physician Assistant (Unknown Physician Specialty) [name], RN as Oncology Nurse Navigator [name], RN as Oncology Nurse Navigator [name], MD as Consulting Physician (Hematology and Oncology) [name], MD as Attending Physician (Radiation Oncology) [name], MD as Consulting Physician (General Surgery)  CHIEF COMPLAINTS/PURPOSE OF CONSULTATION:  Newly diagnosed breast cancer  HISTORY OF PRESENTING ILLNESS:  Catherine Long 69 y.o. female is here because of recent diagnosis of invasive mammary carcinoma of the right breast. Screening mammogram detected a possible right breast mass. Diagnostic mammogram and US of the right breast and axilla on 08/25/19 showed two adjacent and nearly contiguous masses at the 2 o'clock position, 1.7cm, no right axillary adenopathy. Biopsy on 09/01/19 showed invasive mammary carcinoma, grade 2, HER-2 equivocal by IHC, negative by FISH, ER+ 95%, PR+ 90%, Ki67 2%. She presents to the clinic today for initial evaluation and discussion of treatment options.   I reviewed her records extensively and collaborated the history with the patient.  SUMMARY OF ONCOLOGIC HISTORY: Oncology History  Malignant neoplasm of upper-inner quadrant of right breast in female, estrogen receptor positive ([place])  09/07/2019 Initial Diagnosis   Screening mammogram detected a possible right breast mass. Diagnostic mammogram showed two adjacent and nearly contiguous masses at the 2 o'clock position, 1.7cm, no right axillary adenopathy. Biopsy showed invasive mammary carcinoma, grade 2, HER-2 equivocal by IHC, negative by FISH, ER+ 95%, PR+ 90%, Ki67 2%.     MEDICAL HISTORY:  Past Medical History:  Diagnosis Date  . Melanoma ([place])   . Right middle lobe syndrome     SURGICAL HISTORY: Past Surgical History:   Procedure Laterality Date  . TOTAL HIP ARTHROPLASTY  2003 and 2006   Bilateral    SOCIAL HISTORY: Social History   Socioeconomic History  . Marital status: Widowed    Spouse name: Not on file  . Number of children: 2  . Years of education: Not on file  . Highest education level: Not on file  Occupational History  . Occupation: Retired  Tobacco Use  . Smoking status: Never Smoker  . Smokeless tobacco: Never Used  Substance and Sexual Activity  . Alcohol use: Yes    Comment: Socially  . Drug use: No  . Sexual activity: Not on file  Other Topics Concern  . Not on file  Social History Narrative  . Not on file   Social Determinants of Health   Financial Resource Strain:   . Difficulty of Paying Living Expenses: Not on file  Food Insecurity:   . Worried About Charity fundraiser in the Last Year: Not on file  . Ran Out of Food in the Last Year: Not on file  Transportation Needs:   . Lack of Transportation (Medical): Not on file  . Lack of Transportation (Non-Medical): Not on file  Physical Activity:   . Days of Exercise per Week: Not on file  . Minutes of Exercise per Session: Not on file  Stress:   . Feeling of Stress : Not on file  Social Connections:   . Frequency of Communication with Friends and Family: Not on file  . Frequency of Social Gatherings with Friends and Family: Not on file  . Attends Religious Services: Not on file  . Active Member of Clubs or Organizations: Not on file  . Attends Archivist Meetings: Not on  file  . Marital Status: Not on file  Intimate Partner Violence:   . Fear of Current or Ex-Partner: Not on file  . Emotionally Abused: Not on file  . Physically Abused: Not on file  . Sexually Abused: Not on file    FAMILY HISTORY: Family History  Problem Relation Age of Onset  . Heart disease Father   . Breast cancer Mother     ALLERGIES:  has No Known Allergies.  MEDICATIONS:  Current Outpatient Medications  Medication  Sig Dispense Refill  . B Complex-C (SUPER B COMPLEX) TABS Take 1 tablet by mouth daily.    . Calcium Carb-Cholecalciferol (CALCIUM 1000 + D PO) Take 1 tablet by mouth daily. Calcium 1200 mg qd    . cholecalciferol (VITAMIN D) 1000 UNITS tablet Take 1,000 Units by mouth daily.    . Multiple Vitamins-Minerals (MULTI COMPLETE/IRON PO) Take 1 tablet by mouth daily.    Marland Kitchen triamcinolone cream (KENALOG) 0.1 % Apply 1 application topically as needed.   0  . Turmeric 500 MG CAPS Take by mouth.    . vitamin C (ASCORBIC ACID) 500 MG tablet Take 500 mg by mouth daily.     No current facility-administered medications for this visit.    REVIEW OF SYSTEMS:   Constitutional: Denies fevers, chills or abnormal night sweats Eyes: Denies blurriness of vision, double vision or watery eyes Ears, nose, mouth, throat, and face: Denies mucositis or sore throat Respiratory: Denies cough, dyspnea or wheezes Cardiovascular: Denies palpitation, chest discomfort or lower extremity swelling Gastrointestinal:  Denies nausea, heartburn or change in bowel habits Skin: Denies abnormal skin rashes Lymphatics: Denies new lymphadenopathy or easy bruising Neurological:Denies numbness, tingling or new weaknesses Behavioral/Psych: Mood is stable, no new changes  Breast: Denies any palpable lumps or discharge All other systems were reviewed with the patient and are negative.  PHYSICAL EXAMINATION: ECOG PERFORMANCE STATUS: 1 - Symptomatic but completely ambulatory  Vitals:   09/13/19 0855  BP: (!) 148/68  Pulse: 89  Resp: 18  Temp: 98.9 F (37.2 C)  SpO2: 97%   Filed Weights   09/13/19 0855  Weight: 151 lb 12.8 oz (68.9 kg)    GENERAL:alert, no distress and comfortable SKIN: skin color, texture, turgor are normal, no rashes or significant lesions EYES: normal, conjunctiva are pink and non-injected, sclera clear OROPHARYNX:no exudate, no erythema and lips, buccal mucosa, and tongue normal  NECK: supple, thyroid  normal size, non-tender, without nodularity LYMPH:  no palpable lymphadenopathy in the cervical, axillary or inguinal LUNGS: clear to auscultation and percussion with normal breathing effort HEART: regular rate & rhythm and no murmurs and no lower extremity edema ABDOMEN:abdomen soft, non-tender and normal bowel sounds Musculoskeletal:no cyanosis of digits and no clubbing  PSYCH: alert & oriented x 3 with fluent speech NEURO: no focal motor/sensory deficits BREAST: No palpable nodules in breast. No palpable axillary or supraclavicular lymphadenopathy (exam performed in the presence of a chaperone)   LABORATORY DATA:  I have reviewed the data as listed Lab Results  Component Value Date   WBC 11.0 (H) 09/13/2019   HGB 13.4 09/13/2019   HCT 41.6 09/13/2019   MCV 90.8 09/13/2019   PLT 354 09/13/2019   Lab Results  Component Value Date   NA 142 09/13/2019   K 4.3 09/13/2019   CL 104 09/13/2019   CO2 26 09/13/2019    RADIOGRAPHIC STUDIES: I have personally reviewed the radiological reports and agreed with the findings in the report.  ASSESSMENT AND PLAN:  Malignant neoplasm of upper-inner quadrant of right breast in female, estrogen receptor positive ([place]) 09/07/2019:Screening mammogram detected a possible right breast mass. Diagnostic mammogram showed two adjacent and nearly contiguous masses at the 2 o'clock position, 1.7cm, no right axillary adenopathy. Biopsy showed invasive mammary carcinoma, grade 2, HER-2 equivocal by IHC, negative by FISH, ER+ 95%, PR+ 90%, Ki67 2%. T1CN0 stage Ia clinical stage  Pathology and radiology counseling: Discussed with the patient, the details of pathology including the type of breast cancer,the clinical staging, the significance of ER, PR and HER-2/neu receptors and the implications for treatment. After reviewing the pathology in detail, we proceeded to discuss the different treatment options between surgery, radiation, chemotherapy, antiestrogen  therapies.  Treatment plan: 1.  Breast conserving surgery with sentinel lymph node biopsy 2.  Oncotype DX to determine if chemotherapy would be of benefit 3.  Adjuvant radiation therapy 4.  Follow-up adjuvant antiestrogen therapy 5.  Genetic testing because she has breast and ovarian cancer family history.  Return to clinic after surgery to discuss final pathology report and confirm the adjuvant treatment plan.   All questions were answered. The patient knows to call the clinic with any problems, questions or concerns.   Rulon Eisenmenger, MD, MPH 09/13/2019    I, Molly Dorshimer, am acting as scribe for [name], MD.  I have reviewed the above documentation for accuracy and completeness, and I agree with the above.

## 2019-09-12 NOTE — Progress Notes (Signed)
Radiation Oncology         [phone]) [phone] ________________________________  Initial outpatient Consultation  Name: Catherine Long MRN: [id number]  Date: 09/13/2019  DOB: 1951/05/20  PQ:AESLPNP, Cammie Mcgee, MD  Alphonsa Overall, MD   REFERRING PHYSICIAN: Alphonsa Overall, MD  DIAGNOSIS:    ICD-10-CM   1. Malignant neoplasm of upper-inner quadrant of right breast in female, estrogen receptor positive ([place])  C50.211    Z17.0    Cancer Staging Malignant neoplasm of upper-inner quadrant of right breast in female, estrogen receptor positive ([place]) Staging form: Breast, AJCC 8th Edition - Clinical stage from 09/13/2019: Stage IA (cT1c, cN0, cM0, G2, ER+, PR+, HER2-) - Unsigned    CHIEF COMPLAINT: Here to discuss management of right breast cancer  HISTORY OF PRESENT ILLNESS::Catherine Long is a 69 y.o. female who presented with breast abnormality on the following imaging: screening mammogram on the date of 08/16/2019.  Symptoms, if any, at that time, were: none.   Ultrasound of breast on 1/22/202 revealed a 1.7 cm mass upper-inner right breast at 2 o'clock.  Axilla was negative on ultrasound.  Biopsy on date of 09/01/2019 showed invasive ductal carcinoma.  ER status: positive; PR status positive, Her2 status negative by FISH; Grade 2.  The patient reports that she has a personal history of melanoma.  Her mother had bilateral breast cancers in her mid 3s.  She is still alive in her 64s.  The patient reports history of ovarian cancer among her aunts.  None of her sisters have breast cancer or ovarian cancer.  PREVIOUS RADIATION THERAPY: No  PAST MEDICAL HISTORY:  has a past medical history of Melanoma ([place]) and Right middle lobe syndrome.    PAST SURGICAL HISTORY: Past Surgical History:  Procedure Laterality Date  . TOTAL HIP ARTHROPLASTY  2003 and 2006   Bilateral    FAMILY HISTORY: family history includes Breast cancer in her mother; Heart disease in her father.  SOCIAL HISTORY:  reports that  she has never smoked. She has never used smokeless tobacco. She reports current alcohol use. She reports that she does not use drugs.  ALLERGIES: Patient has no known allergies.  MEDICATIONS:  Current Outpatient Medications  Medication Sig Dispense Refill  . B Complex-C (SUPER B COMPLEX) TABS Take 1 tablet by mouth daily.    . Calcium Carb-Cholecalciferol (CALCIUM 1000 + D PO) Take 1 tablet by mouth daily. Calcium 1200 mg qd    . cholecalciferol (VITAMIN D) 1000 UNITS tablet Take 1,000 Units by mouth daily.    . Multiple Vitamins-Minerals (MULTI COMPLETE/IRON PO) Take 1 tablet by mouth daily.    Marland Kitchen triamcinolone cream (KENALOG) 0.1 % Apply 1 application topically as needed.   0  . Turmeric 500 MG CAPS Take by mouth.    . vitamin C (ASCORBIC ACID) 500 MG tablet Take 500 mg by mouth daily.     No current facility-administered medications for this encounter.    REVIEW OF SYSTEMS: As above   PHYSICAL EXAM:  vitals were not taken for this visit.   General: Alert and oriented, in no acute distress Psychiatric: Judgment and insight are intact. Affect is appropriate. Breasts: In the central right breast at 1:00, there is a 2 cm palpable mass that may partly be biopsy artifact. No other palpable masses appreciated in the breasts or axillae bilaterally.   ECOG = 0  0 - Asymptomatic (Fully active, able to carry on all predisease activities without restriction)  1 - Symptomatic but completely ambulatory (Restricted in  physically strenuous activity but ambulatory and able to carry out work of a light or sedentary nature. For example, light housework, office work)  2 - Symptomatic, <50% in bed during the day (Ambulatory and capable of all self care but unable to carry out any work activities. Up and about more than 50% of waking hours)  3 - Symptomatic, >50% in bed, but not bedbound (Capable of only limited self-care, confined to bed or chair 50% or more of waking hours)  4 - Bedbound  (Completely disabled. Cannot carry on any self-care. Totally confined to bed or chair)  5 - Death   [name] MM, Creech RH, Tormey DC, et al. [phone]). "Toxicity and response criteria of the [facility]". [place] Oncol. 5 (6): 649-55   LABORATORY DATA:  Lab Results  Component Value Date   WBC 11.0 (H) 09/13/2019   HGB 13.4 09/13/2019   HCT 41.6 09/13/2019   MCV 90.8 09/13/2019   PLT 354 09/13/2019   CMP     Component Value Date/Time   NA 142 09/13/2019 0830   K 4.3 09/13/2019 0830   CL 104 09/13/2019 0830   CO2 26 09/13/2019 0830   GLUCOSE 101 (H) 09/13/2019 0830   BUN 11 09/13/2019 0830   CREATININE 0.82 09/13/2019 0830   CREATININE 0.77 05/19/2019 0811   CALCIUM 9.4 09/13/2019 0830   PROT 7.8 09/13/2019 0830   ALBUMIN 4.2 09/13/2019 0830   AST 19 09/13/2019 0830   ALT 18 09/13/2019 0830   ALKPHOS 62 09/13/2019 0830   BILITOT 0.4 09/13/2019 0830   GFRNONAA >60 09/13/2019 0830   GFRNONAA 79 05/11/2017 0843   GFRAA >60 09/13/2019 0830   GFRAA 92 05/11/2017 0843         RADIOGRAPHY: US BREAST LTD UNI RIGHT INC AXILLA  Result Date: 08/25/2019 CLINICAL DATA:  69 year old patient recalled from recent screening mammogram for evaluation of a possible right breast mass. The patient's mother has a history of breast cancer. EXAM: DIGITAL DIAGNOSTIC RIGHT MAMMOGRAM WITH CAD AND TOMO ULTRASOUND RIGHT BREAST COMPARISON:  August 25, 2019 and earlier priors ACR Breast Density Category c: The breast tissue is heterogeneously dense, which may obscure small masses. FINDINGS: Spot compression views of the right breast confirm an irregular mass in the upper inner quadrant in the middle third of the breast parenchyma. Mammographic images were processed with CAD. On physical exam, there is subtle nodularity in the upper inner quadrant of the right breast in the region of the mass, without definite suspicious mass on physical exam. The skin appears normal. Targeted  ultrasound is performed, showing two immediately adjacent, nearly contiguous, irregular hypoechoic masses that are measured together at 2 o'clock position 2 cm from the nipple. In total the abnormality measures 1.7 x 1.0 x 0.8 cm. No internal vascular flow is identified, but a mildly prominent adjacent vessel is seen. Ultrasound of the right axilla is negative for lymphadenopathy. IMPRESSION: Suspicious mass upper inner quadrant right breast at 2 o'clock position. RECOMMENDATION: Ultrasound-guided core needle biopsy of the right breast mass 2 o'clock position is recommended. I have discussed the findings and recommendations with the patient. If applicable, a reminder letter will be sent to the patient regarding the next appointment. BI-RADS CATEGORY  5: Highly suggestive of malignancy. Electronically Signed   By: Curlene Dolphin M.D.   On: 08/25/2019 11:36   MM DIAG BREAST TOMO UNI RIGHT  Result Date: 08/25/2019 CLINICAL DATA:  69 year old patient recalled from recent screening mammogram for  evaluation of a possible right breast mass. The patient's mother has a history of breast cancer. EXAM: DIGITAL DIAGNOSTIC RIGHT MAMMOGRAM WITH CAD AND TOMO ULTRASOUND RIGHT BREAST COMPARISON:  August 25, 2019 and earlier priors ACR Breast Density Category c: The breast tissue is heterogeneously dense, which may obscure small masses. FINDINGS: Spot compression views of the right breast confirm an irregular mass in the upper inner quadrant in the middle third of the breast parenchyma. Mammographic images were processed with CAD. On physical exam, there is subtle nodularity in the upper inner quadrant of the right breast in the region of the mass, without definite suspicious mass on physical exam. The skin appears normal. Targeted ultrasound is performed, showing two immediately adjacent, nearly contiguous, irregular hypoechoic masses that are measured together at 2 o'clock position 2 cm from the nipple. In total the abnormality  measures 1.7 x 1.0 x 0.8 cm. No internal vascular flow is identified, but a mildly prominent adjacent vessel is seen. Ultrasound of the right axilla is negative for lymphadenopathy. IMPRESSION: Suspicious mass upper inner quadrant right breast at 2 o'clock position. RECOMMENDATION: Ultrasound-guided core needle biopsy of the right breast mass 2 o'clock position is recommended. I have discussed the findings and recommendations with the patient. If applicable, a reminder letter will be sent to the patient regarding the next appointment. BI-RADS CATEGORY  5: Highly suggestive of malignancy. Electronically Signed   By: Curlene Dolphin M.D.   On: 08/25/2019 11:36   MM CLIP PLACEMENT RIGHT  Result Date: 09/01/2019 CLINICAL DATA:  Ultrasound-guided core needle biopsy was performed of a suspicious mass in the 2 o'clock axis of the right breast. EXAM: DIAGNOSTIC RIGHT MAMMOGRAM POST ULTRASOUND BIOPSY COMPARISON:  Previous exam(s). FINDINGS: Mammographic images were obtained following ultrasound guided biopsy of a right breast mass at 2 o'clock position. The biopsy marking clip is in expected position at the site of biopsy. IMPRESSION: Appropriate positioning of the ribbon shaped biopsy marking clip at the site of biopsy in the biopsied mass, upper inner quadrant. Final Assessment: Post Procedure Mammograms for Marker Placement Electronically Signed   By: Curlene Dolphin M.D.   On: 09/01/2019 08:20   [place] RT BREAST BX W LOC DEV 1ST LESION IMG BX SPEC US GUIDE  Addendum Date: 09/04/2019   ADDENDUM REPORT: 09/04/2019 13:25 ADDENDUM: Pathology revealed GRADE II INVASIVE MAMMARY CARCINOMA of the Right breast, 2 o'clock position, 2cmfn. This was found to be concordant by Dr. Curlene Dolphin. Pathology results were discussed with the patient by telephone. The patient reported doing well after the biopsy with tenderness at the site. Post biopsy instructions and care were reviewed and questions were answered. The patient was encouraged  to call [facility] for any additional concerns. The patient was referred to [facility] on September 12, 2018. Pathology results reported by Terie Purser, RN on 09/04/2019. Electronically Signed   By: Curlene Dolphin M.D.   On: 09/04/2019 13:25   Result Date: 09/04/2019 CLINICAL DATA:  Ultrasound-guided right core needle biopsy was recommended of a suspicious mass at 2 o'clock position 2 cm from the nipple. EXAM: ULTRASOUND GUIDED RIGHT BREAST CORE NEEDLE BIOPSY COMPARISON:  Previous exam(s). FINDINGS: I met with the patient and we discussed the procedure of ultrasound-guided biopsy, including benefits and alternatives. We discussed the high likelihood of a successful procedure. We discussed the risks of the procedure, including infection, bleeding, tissue injury, clip migration, and inadequate sampling. Informed  written consent was given. The usual time-out protocol was performed immediately prior to the procedure. Lesion quadrant: Upper inner quadrant Using sterile technique and 1% Lidocaine as local anesthetic, under direct ultrasound visualization, a 12 gauge spring-loaded device was used to perform biopsy of a suspicious 1.7 cm mass 2 o'clock position right breast using a lateral approach. At the conclusion of the procedure ribbon tissue marker clip was deployed into the biopsy cavity. Follow up 2 view mammogram was performed and dictated separately. IMPRESSION: Ultrasound guided biopsy of the right breast. No apparent complications. Electronically Signed: By: Curlene Dolphin M.D. On: 09/04/2019 09:18      IMPRESSION/PLAN: Right Breast Cancer   She has been discussed at our multidisciplinary tumor board.  The consensus is that she would be a good candidate for breast conservation. I talked to her about the option of a mastectomy and informed her that her expected overall survival would be equivalent between  mastectomy and breast conservation, based upon randomized controlled data. She is enthusiastic about breast conservation.  It was a pleasure meeting the patient today. We discussed the risks, benefits, and side effects of radiotherapy. I recommend radiotherapy to the right breast to reduce her risk of locoregional recurrence by 2/3.  We discussed that radiation would take approximately 4 weeks to complete and that I would give the patient a few weeks to heal following surgery before starting treatment planning.  If chemotherapy were to be given, this would precede radiotherapy. We spoke about acute effects including skin irritation and fatigue as well as much less common late effects including internal organ injury or irritation. We spoke about the latest technology that is used to minimize the risk of late effects for patients undergoing radiotherapy to the breast or chest wall. No guarantees of treatment were given. The patient is enthusiastic about proceeding with treatment. I look forward to participating in the patient's care.  I will await her referral back to me for postoperative follow-up and eventual CT simulation/treatment planning.  We also discussed that she should not take extra vitamins A, C or E, other than that which is contained in a single multivitamin or in food, during radiotherapy.  Due to her family history a referral be made to genetics counseling.  On date of service, in total, I spent 45 minutes on this encounter. __________________________________________   [name], MD   This document serves as a record of services personally performed by [name], MD. It was created on her behalf by Wilburn Mylar, a trained medical scribe. The creation of this record is based on the scribe's personal observations and the provider's statements to them. This document has been checked and approved by the attending provider.

## 2019-09-13 ENCOUNTER — Other Ambulatory Visit: Payer: Self-pay | Admitting: Surgery

## 2019-09-13 ENCOUNTER — Inpatient Hospital Stay: Payer: Medicare PPO

## 2019-09-13 ENCOUNTER — Ambulatory Visit: Payer: Medicare PPO | Attending: Hematology and Oncology | Admitting: Physical Therapy

## 2019-09-13 ENCOUNTER — Inpatient Hospital Stay: Payer: Medicare PPO | Attending: Hematology and Oncology | Admitting: Hematology and Oncology

## 2019-09-13 ENCOUNTER — Ambulatory Visit
Admission: RE | Admit: 2019-09-13 | Discharge: 2019-09-13 | Disposition: A | Discharge status: Home / Self Care | Payer: Medicare PPO | Source: Ambulatory Visit | Attending: Radiation Oncology | Admitting: Radiation Oncology

## 2019-09-13 ENCOUNTER — Encounter: Payer: Self-pay | Admitting: Hematology and Oncology

## 2019-09-13 ENCOUNTER — Encounter: Payer: Self-pay | Admitting: Physical Therapy

## 2019-09-13 ENCOUNTER — Other Ambulatory Visit: Payer: Self-pay

## 2019-09-13 ENCOUNTER — Ambulatory Visit (HOSPITAL_BASED_OUTPATIENT_CLINIC_OR_DEPARTMENT_OTHER): Payer: Medicare PPO | Admitting: Genetic Counselor

## 2019-09-13 ENCOUNTER — Encounter: Payer: Self-pay | Admitting: Genetic Counselor

## 2019-09-13 DIAGNOSIS — C50911 Malignant neoplasm of unspecified site of right female breast: Secondary | ICD-10-CM | POA: Diagnosis not present

## 2019-09-13 DIAGNOSIS — Z85820 Personal history of malignant melanoma of skin: Secondary | ICD-10-CM

## 2019-09-13 DIAGNOSIS — Z803 Family history of malignant neoplasm of breast: Secondary | ICD-10-CM | POA: Diagnosis not present

## 2019-09-13 DIAGNOSIS — C50211 Malignant neoplasm of upper-inner quadrant of right female breast: Secondary | ICD-10-CM

## 2019-09-13 DIAGNOSIS — R293 Abnormal posture: Secondary | ICD-10-CM | POA: Diagnosis not present

## 2019-09-13 DIAGNOSIS — Z170 Estrogen receptor positive status [ER+]: Secondary | ICD-10-CM

## 2019-09-13 DIAGNOSIS — Z808 Family history of malignant neoplasm of other organs or systems: Secondary | ICD-10-CM | POA: Diagnosis not present

## 2019-09-13 DIAGNOSIS — C439 Malignant melanoma of skin, unspecified: Secondary | ICD-10-CM | POA: Insufficient documentation

## 2019-09-13 DIAGNOSIS — Z8041 Family history of malignant neoplasm of ovary: Secondary | ICD-10-CM | POA: Insufficient documentation

## 2019-09-13 DIAGNOSIS — Z853 Personal history of malignant neoplasm of breast: Secondary | ICD-10-CM

## 2019-09-13 LAB — CBC WITH DIFFERENTIAL (CANCER CENTER ONLY)
Abs Immature Granulocytes: 0.04 10*3/uL (ref 0.00–0.07)
Basophils Absolute: 0.1 10*3/uL (ref 0.0–0.1)
Basophils Relative: 1 %
Eosinophils Absolute: 0.1 10*3/uL (ref 0.0–0.5)
Eosinophils Relative: 1 %
HCT: 41.6 % (ref 36.0–46.0)
Hemoglobin: 13.4 g/dL (ref 12.0–15.0)
Immature Granulocytes: 0 %
Lymphocytes Relative: 13 %
Lymphs Abs: 1.4 10*3/uL (ref 0.7–4.0)
MCH: 29.3 pg (ref 26.0–34.0)
MCHC: 32.2 g/dL (ref 30.0–36.0)
MCV: 90.8 fL (ref 80.0–100.0)
Monocytes Absolute: 0.5 10*3/uL (ref 0.1–1.0)
Monocytes Relative: 5 %
Neutro Abs: 8.9 10*3/uL — ABNORMAL HIGH (ref 1.7–7.7)
Neutrophils Relative %: 80 %
Platelet Count: 354 10*3/uL (ref 150–400)
RBC: 4.58 MIL/uL (ref 3.87–5.11)
RDW: 14.2 % (ref 11.5–15.5)
WBC Count: 11 10*3/uL — ABNORMAL HIGH (ref 4.0–10.5)
nRBC: 0 % (ref 0.0–0.2)

## 2019-09-13 LAB — CMP (CANCER CENTER ONLY)
ALT: 18 U/L (ref 0–44)
AST: 19 U/L (ref 15–41)
Albumin: 4.2 g/dL (ref 3.5–5.0)
Alkaline Phosphatase: 62 U/L (ref 38–126)
Anion gap: 12 (ref 5–15)
BUN: 11 mg/dL (ref 8–23)
CO2: 26 mmol/L (ref 22–32)
Calcium: 9.4 mg/dL (ref 8.9–10.3)
Chloride: 104 mmol/L (ref 98–111)
Creatinine: 0.82 mg/dL (ref 0.44–1.00)
GFR, Est AFR Am: >60 mL/min (ref >60)
GFR, Estimated: >60 mL/min (ref >60)
Glucose, Bld: 101 mg/dL — ABNORMAL HIGH (ref 70–99)
Potassium: 4.3 mmol/L (ref 3.5–5.1)
Sodium: 142 mmol/L (ref 135–145)
Total Bilirubin: 0.4 mg/dL (ref 0.3–1.2)
Total Protein: 7.8 g/dL (ref 6.5–8.1)

## 2019-09-13 LAB — GENETIC SCREENING ORDER

## 2019-09-13 NOTE — Patient Instructions (Signed)

## 2019-09-13 NOTE — Progress Notes (Signed)
REFERRING PROVIDER: [name], MD [place],  [place] [address]  PRIMARY PROVIDER:  [name], MD  PRIMARY REASON FOR VISIT:  1. Malignant neoplasm of upper-inner quadrant of right breast in female, estrogen receptor positive ([place])   2. Family history of breast cancer   3. Family history of ovarian cancer   4. Family history of melanoma   5. History of melanoma     I connected with Ms. Coral on 09/13/2019 at 11:00 am EDT by [facility] video conference and verified that I am speaking with the correct person using two identifiers.   Patient location: [facility] clinic Provider location: [facility] office  HISTORY OF PRESENT ILLNESS:   Ms. Catherine Long, a 69 y.o. female, was seen for a [facility] cancer genetics consultation at the request of Dr. Lindi Adie due to a personal history of breast cancer and a family history of breast and ovarian cancer.  Ms. Moncada presents to clinic today to discuss the possibility of a hereditary predisposition to cancer, genetic testing, and to further clarify her future cancer risks, as well as potential cancer risks for family members.   In 2021, at the age of 35, Ms. Sigmund was diagnosed with invasive mammary carcinoma, ER+/PR+/Her2-, of the right breast. The treatment plan includes surgery, Oncotype DX to determine if chemotherapy would be of benefit, adjuvant radiation therapy, and follow-up adjuvant antiestrogen therapy. Ms. Currington also has a history of melanoma diagnosed in 36 at age 67-23 and a history of non-melanoma skin cancer (squamous cell carcinoma and basal cell carcinoma). She notes that she (as well as others in her family) had a lot of sun exposure when she was younger and lived in [place].   CANCER HISTORY:  Oncology History  Malignant neoplasm of upper-inner quadrant of right breast in female, estrogen receptor positive ([place])  09/07/2019 Initial Diagnosis   Screening mammogram detected a possible right breast mass.  Diagnostic mammogram showed two adjacent and nearly contiguous masses at the 2 o'clock position, 1.7cm, no right axillary adenopathy. Biopsy showed invasive mammary carcinoma, grade 2, HER-2 equivocal by IHC, negative by FISH, ER+ 95%, PR+ 90%, Ki67 2%.      RISK FACTORS:  Menarche was at age 27.  First live birth at age 36.  OCP use for approximately 3-4 years.  Ovaries intact: yes.  Hysterectomy: no.  Menopausal status: postmenopausal, age 2.  HRT use: 0 years. Colonoscopy: yes; last colonoscopy in 2016 was normal, per patient . Mammogram within the last year: yes. Number of breast biopsies: 1. Any excessive radiation exposure in the past: no  Past Medical History:  Diagnosis Date  . Family history of breast cancer   . Family history of melanoma   . Family history of ovarian cancer   . History of melanoma   . Melanoma ([place])   . Right middle lobe syndrome     Past Surgical History:  Procedure Laterality Date  . TOTAL HIP ARTHROPLASTY  2003 and 2006   Bilateral    Social History   Socioeconomic History  . Marital status: Widowed    Spouse name: Not on file  . Number of children: 2  . Years of education: Not on file  . Highest education level: Not on file  Occupational History  . Occupation: Retired  Tobacco Use  . Smoking status: Never Smoker  . Smokeless tobacco: Never Used  Substance and Sexual Activity  . Alcohol use: Yes    Comment: Socially  . Drug use: No  .  Sexual activity: Not on file  Other Topics Concern  . Not on file  Social History Narrative  . Not on file   Social Determinants of Health   Financial Resource Strain:   . Difficulty of Paying Living Expenses: Not on file  Food Insecurity:   . Worried About Charity fundraiser in the Last Year: Not on file  . Ran Out of Food in the Last Year: Not on file  Transportation Needs:   . Lack of Transportation (Medical): Not on file  . Lack of Transportation (Non-Medical): Not on file  Physical  Activity:   . Days of Exercise per Week: Not on file  . Minutes of Exercise per Session: Not on file  Stress:   . Feeling of Stress : Not on file  Social Connections:   . Frequency of Communication with Friends and Family: Not on file  . Frequency of Social Gatherings with Friends and Family: Not on file  . Attends Religious Services: Not on file  . Active Member of Clubs or Organizations: Not on file  . Attends Archivist Meetings: Not on file  . Marital Status: Not on file     FAMILY HISTORY:  We obtained a detailed, 4-generation family history.  Significant diagnoses are listed below: Family History  Problem Relation Age of Onset  . Heart disease Father   . Melanoma Father        dx. 12s  . Breast cancer Mother        bilateral, dx. mid-50s  . Melanoma Mother        dx. 80s  . Melanoma Sister        dx. 31s  . Breast cancer Maternal Aunt        dx. >50  . Ovarian cancer Maternal Aunt        dx. >50   Ms. Mefferd has two daughters who are 19 and 68 and have not had cancer. Her younger daughter, Jinny Blossom, did have a breast lump removed when she was in college, although it was benign. Ms. Koslow has one brother and five sisters. One sister had melanoma diagnosed in her 90s.   Ms. Errickson mother is currently in her 71s and has a history of bilateral breast cancer diagnosed in her mid-50s and melanoma diagnosed in her 33s. Ms. Pincock had two maternal aunts and one maternal uncle. One aunt was diagnosed with breast cancer when she was older than 49, and the other aunt was diagnosed with ovarian cancer when she was older than 28. Ms. Domenico is not sure if her uncle had cancer, but notes that he did have many health problems. Her maternal grandparents died in their 42s and did not have a history of cancer. There are no other known diagnoses of cancer on the maternal side of the family.  Ms. Pies father died at age 79 and had a history of melanoma diagnosed in his  76s. She had three paternal aunts and six paternal uncles, all of whom died when they were older than 27 except for one uncle who died in an accident. None had cancer to Ms. Mobley's knowledge. Her paternal grandfather died when he was around 3 due to heart problems, and her paternal grandmother died shortly afterward, although not from cancer. There are no other known diagnoses of cancer on the paternal side of the family.  Ms. Pranger is unaware of previous family history of genetic testing for hereditary cancer risks. Her ancestors are of [place],  [place], and [place] descent. There is no reported Ashkenazi Jewish ancestry. There is no known consanguinity.  GENETIC COUNSELING ASSESSMENT: Ms. Amborn is a 69 y.o. female with a personal and family history of breast cancer and melanoma as well as a family history of ovarian cancer, which is somewhat suggestive of a hereditary cancer syndrome and predisposition to cancer. We, therefore, discussed and recommended the following at today's visit.   DISCUSSION: We discussed that 5-10% of breast cancer is hereditary, with most cases associated with the BRCA1 and BRCA2 genes.  There are other genes that can be associated with hereditary breast cancer syndromes.  These include ATM, CHEK2, PALB2, etc.  We discussed that testing is beneficial for several reasons, including knowing about other cancer risks, identifying potential screening and risk-reduction options that may be appropriate, and to understand if other family members could be at risk for cancer and allow them to undergo genetic testing.   We reviewed the characteristics, features and inheritance patterns of hereditary cancer syndromes. We also discussed genetic testing, including the appropriate family members to test, the process of testing, insurance coverage and turn-around-time for results. We discussed the implications of a negative, positive and/or variant of uncertain significant result. In  order to get genetic test results in a timely manner so that Ms. Koval can use these genetic test results for surgical decisions, we recommended Ms. Grabbe pursue genetic testing for the [facility] Breast Cancer STAT panel. Once complete, we recommend Ms. Naas pursue reflex genetic testing to the Common Hereditary Cancers panel.   The STAT Breast cancer panel offered by [facility] includes sequencing and rearrangement analysis for the following 9 genes:  ATM, BRCA1, BRCA2, CDH1, CHEK2, PALB2, PTEN, STK11 and TP53.  The Common Hereditary Cancers Panel offered by [facility] includes sequencing and/or deletion duplication testing of the following 48 genes: APC, ATM, AXIN2, BARD1, BMPR1A, BRCA1, BRCA2, BRIP1, CDH1, CDK4, CDKN2A (p14ARF), CDKN2A (p16INK4a), CHEK2, CTNNA1, DICER1, EPCAM (Deletion/duplication testing only), GREM1 (promoter region deletion/duplication testing only), KIT, MEN1, MLH1, MSH2, MSH3, MSH6, MUTYH, NBN, NF1, NHTL1, PALB2, PDGFRA, PMS2, POLD1, POLE, PTEN, RAD50, RAD51C, RAD51D, RNF43, SDHB, SDHC, SDHD, SMAD4, SMARCA4. STK11, TP53, TSC1, TSC2, and VHL.  The following genes are evaluated for sequence changes only: SDHA and HOXB13 c.251G>A variant only.  Based on Ms. Bicking's personal and family history of breast and ovarian cancer, she meets medical criteria for genetic testing. Despite that she meets criteria, she may still have an out of pocket cost.   PLAN: After considering the risks, benefits, and limitations, Ms. Blust provided informed consent to pursue genetic testing and the blood sample was sent to [facility] for analysis of the Breast Cancer STAT panel + Common Hereditary Cancers panel. Results should be available within approximately one-two weeks' time, at which point they will be disclosed by telephone to Ms. Clavin, as will any additional recommendations warranted by these results. Ms. Bath will receive a summary of her genetic counseling visit and a copy of  her results once available. This information will also be available in Epic.   Ms. Creekmore questions were answered to her satisfaction today. Our contact information was provided should additional questions or concerns arise. Thank you for the referral and allowing [place] to share in the care of your patient.   Clint Guy, MS, [facility] Genetic Counselor [email or web address] Phone: [phone]  The patient was seen for a total of 20 minutes in face-to-face genetic counseling.  This patient was discussed with Drs. Magrinat, Lindi Adie and/or Burr Medico who agrees  with the above.    _______________________________________________________________________ For Office Staff:  Number of people involved in session: 2 Was an Intern/ student involved with case: no

## 2019-09-13 NOTE — Progress Notes (Signed)
[place] Psychosocial Distress Screening Clinical Social Work  Clinical Social Work was referred by distress screening protocol.  The patient scored a 2 on the Psychosocial Distress Thermometer which indicates mild distress. Counseling intern met with patient in exam room to assess for distress and other psychosocial needs.  ONCBCN DISTRESS SCREENING 09/13/2019  Screening Type Initial Screening  Distress experienced in past week (1-10) 2  Information Concerns Type Lack of info about treatment;Lack of info about complementary therapy choices;Lack of info about diagnosis  Referral to clinical social work No  Referral to support programs Yes   Counseling Intern Assessment Notes: Counseling intern met with patient in exam room to complete a psychosocial assessment for distress. Pt's adult daughter attended the appointment with her today to provide support. Pt stated she has a low level of distress and feels even more positive today because she was "relieved" to hear "good news" from her doctor that her cancer is easy to treat. Counseling intern provided space for pt to express her emotional experience and responded with empathy, compassion and normalization of feelings. CI informed pt of available support services at [facility] for the pt and family members (support groups, peer mentor opportunity, spiritual support, and counseling) Pt stated that at this time she is not in need of additional support and she declined the need for support services.   Clinical Social Worker follow up needed: No.  If yes, follow up plan:  [facility] Counseling Intern Voicemail:  [phone]

## 2019-09-13 NOTE — Assessment & Plan Note (Signed)
09/07/2019:Screening mammogram detected a possible right breast mass. Diagnostic mammogram showed two adjacent and nearly contiguous masses at the 2 o'clock position, 1.7cm, no right axillary adenopathy. Biopsy showed invasive mammary carcinoma, grade 2, HER-2 equivocal by IHC, negative by FISH, ER+ 95%, PR+ 90%, Ki67 2%. T1CN0 stage Ia clinical stage  Pathology and radiology counseling: Discussed with the patient, the details of pathology including the type of breast cancer,the clinical staging, the significance of ER, PR and HER-2/neu receptors and the implications for treatment. After reviewing the pathology in detail, we proceeded to discuss the different treatment options between surgery, radiation, chemotherapy, antiestrogen therapies.  Treatment plan: 1.  Breast conserving surgery with sentinel lymph node biopsy 2.  Oncotype DX to determine if chemotherapy would be of benefit 3.  Adjuvant radiation therapy 4.  Follow-up adjuvant antiestrogen therapy  Return to clinic after surgery to discuss final pathology report and confirm the adjuvant treatment plan.

## 2019-09-13 NOTE — Therapy (Signed)
[place], [place], [address] Phone: [phone]   Fax:  [phone]  Physical Therapy Evaluation  Patient Details  Name: Catherine Long MRN: [id number] Date of Birth: 1951/08/03 Referring Provider (PT): Dr. [name]   Encounter Date: 09/13/2019  PT End of Session - 09/13/19 1018    Visit Number  1    Number of Visits  2    Date for PT Re-Evaluation  11/08/19    PT Start Time  1026    PT Stop Time  1055    PT Time Calculation (min)  29 min    Activity Tolerance  Patient tolerated treatment well    Behavior During Therapy  [facility] for tasks assessed/performed       Past Medical History:  Diagnosis Date  . Melanoma ([place])   . Right middle lobe syndrome     Past Surgical History:  Procedure Laterality Date  . TOTAL HIP ARTHROPLASTY  2003 and 2006   Bilateral    There were no vitals filed for this visit.   Subjective Assessment - 09/13/19 1010    Subjective  Patient reports she is here today to be seen by her medical team for her newly diagnosed right breast cancer.    Pertinent History  Patient was diagnosed on 08/14/2019 with right grade II invasive ductal carcinoma breast cancer. It measures 1.7 cm and is located in the upper inner quadrant. It is ER/PR positive and HER2 negative with a Ki67 of 2%. She has had bilateral hip replacements, right in 2003 and left in 2006. She also has a history of right arm melanoma in 1975.    Patient Stated Goals  Reduce lymphedema risk and learn post op shoulder ROM HEP    Currently in Pain?  No/denies         [facility] PT Assessment - 09/13/19 0001      Assessment   Medical Diagnosis  Right breast cancer    Referring Provider (PT)  Dr. [name]    Onset Date/Surgical Date  08/14/19    Hand Dominance  Right    Prior Therapy  none      Precautions   Precautions  Other (comment)    Precaution Comments  active cancer      Restrictions   Weight  Bearing Restrictions  No      Balance Screen   Has the patient fallen in the past 6 months  No    Has the patient had a decrease in activity level because of a fear of falling?   No    Is the patient reluctant to leave their home because of a fear of falling?   No      Home Environment   Living Environment  Private residence    Living Arrangements  Alone    Available Help at Discharge  Family      Prior Function   Level of Independence  Independent    Vocation  Retired    Biomedical scientist  Retired Copywriter, advertising    Leisure  She exercises 4-5x/week using Zoom calls with silver sneakers; 2x/week she does cardio for 45 minutes      Cognition   Overall Cognitive Status  Within Functional Limits for tasks assessed      Posture/Postural Control   Posture/Postural Control  Postural limitations    Postural Limitations  Rounded Shoulders;Forward head      ROM / Strength   AROM / PROM / Strength  AROM;Strength      AROM   AROM Assessment Site  Shoulder;Cervical    Right/Left Shoulder  Right;Left    Right Shoulder Extension  49 Degrees    Right Shoulder Flexion  155 Degrees    Right Shoulder ABduction  160 Degrees    Right Shoulder Internal Rotation  73 Degrees    Right Shoulder External Rotation  85 Degrees    Left Shoulder Extension  51 Degrees    Left Shoulder Flexion  144 Degrees    Left Shoulder ABduction  158 Degrees    Left Shoulder Internal Rotation  79 Degrees    Left Shoulder External Rotation  79 Degrees    Cervical Flexion  WNL    Cervical Extension  50% limited    Cervical - Right Side Bend  25% limited    Cervical - Left Side Bend  25% limited    Cervical - Right Rotation  25% limited    Cervical - Left Rotation  25% limited      Strength   Overall Strength  Within functional limits for tasks performed        LYMPHEDEMA/ONCOLOGY QUESTIONNAIRE - 09/13/19 1016      Type   Cancer Type  right breast cancer      Lymphedema Assessments   Lymphedema  Assessments  Upper extremities      Right Upper Extremity Lymphedema   10 cm Proximal to Olecranon Process  28.5 cm    Olecranon Process  24 cm    10 cm Proximal to Ulnar Styloid Process  20.3 cm    Just Proximal to Ulnar Styloid Process  14.5 cm    Across Hand at [employer]  17.4 cm    At [place] of 2nd Digit  5.9 cm      Left Upper Extremity Lymphedema   10 cm Proximal to Olecranon Process  29.2 cm    Olecranon Process  23.3 cm    10 cm Proximal to Ulnar Styloid Process  19.6 cm    Just Proximal to Ulnar Styloid Process  13.4 cm    Across Hand at [employer]  17.3 cm    At [place] of 2nd Digit  5.4 cm          Quick Dash - 09/13/19 0001    Open a tight or new jar  No difficulty    Do heavy household chores (wash walls, wash floors)  No difficulty    Carry a shopping bag or briefcase  No difficulty    Wash your back  No difficulty    Use a knife to cut food  No difficulty    Recreational activities in which you take some force or impact through your arm, shoulder, or hand (golf, hammering, tennis)  No difficulty    During the past week, to what extent has your arm, shoulder or hand problem interfered with your normal social activities with family, friends, neighbors, or groups?  Not at all    During the past week, to what extent has your arm, shoulder or hand problem limited your work or other regular daily activities  Not at all    Arm, shoulder, or hand pain.  None    Tingling (pins and needles) in your arm, shoulder, or hand  None    Difficulty Sleeping  No difficulty    DASH Score  0 %        Objective measurements completed on examination: See above findings.  Patient was instructed today in a home exercise program today for post op shoulder range of motion. These included active assist shoulder flexion in sitting, scapular retraction, wall walking with shoulder abduction, and hands behind head external rotation.  She was encouraged to do these twice a day,  holding 3 seconds and repeating 5 times when permitted by her physician.        PT Education - 09/13/19 1017    Education Details  Lymphedema risk reduction and post op shoulder ROM HEP    Person(s) Educated  Patient    Methods  Explanation;Demonstration;Handout    Comprehension  Returned demonstration;Verbalized understanding          PT Long Term Goals - 09/13/19 1022      PT LONG TERM GOAL #1   Title  Patient will demonstrate she has regained full shoulder ROM and function post operatively compared to baselines.    Time  8    Period  Weeks    Status  New    Target Date  11/08/19      Breast Clinic Goals - 09/13/19 1022      Patient will be able to verbalize understanding of pertinent lymphedema risk reduction practices relevant to her diagnosis specifically related to skin care.   Time  1    Period  Days    Status  Achieved      Patient will be able to return demonstrate and/or verbalize understanding of the post-op home exercise program related to regaining shoulder range of motion.   Time  1    Period  Days    Status  Achieved      Patient will be able to verbalize understanding of the importance of attending the postoperative After Breast Cancer Class for further lymphedema risk reduction education and therapeutic exercise.   Time  1    Period  Days    Status  Achieved            Plan - 09/13/19 1019    Clinical Impression Statement  Patient was diagnosed on 08/14/2019 with right grade II invasive ductal carcinoma breast cancer. It measures 1.7 cm and is located in the upper inner quadrant. It is ER/PR positive and HER2 negative with a Ki67 of 2%. She has had bilateral hip replacements, right in 2003 and left in 2006. She also has a history of right arm melanoma in 1975. Her multidisciplinary medical team met prior to her assessments to determine a recommended treatment plan. She is planning to have a right lumpectomy and sentinel node biopsy followed by  Oncotype testing, radiation, and anti-estrogen therapy. She will benefit from a post op PT reassessment to determine needs.    Stability/Clinical Decision Making  Stable/Uncomplicated    Clinical Decision Making  Low    Rehab Potential  Excellent    PT Frequency  --   Eval and 1 f/u visit   PT Treatment/Interventions  ADLs/Self Care Home Management;Therapeutic exercise;Patient/family education    PT Next Visit Plan  Will reassess 3-4 weeks post op to determine needs    PT Home Exercise Plan  Post op shoulder ROM HEP    Consulted and Agree with Plan of Care  Patient       Patient will benefit from skilled therapeutic intervention in order to improve the following deficits and impairments:  Postural dysfunction, Decreased range of motion, Pain, Impaired UE functional use, Decreased knowledge of precautions  Visit Diagnosis: Malignant neoplasm of upper-inner quadrant of right breast  in female, estrogen receptor positive ([place]) - Plan: PT plan of care cert/re-cert  Abnormal posture - Plan: PT plan of care cert/re-cert   Patient will follow up at outpatient cancer rehab 3-4 weeks following surgery.  If the patient requires physical therapy at that time, a specific plan will be dictated and sent to the referring physician for approval. The patient was educated today on appropriate basic range of motion exercises to begin post operatively and the importance of attending the After Breast Cancer class following surgery.  Patient was educated today on lymphedema risk reduction practices as it pertains to recommendations that will benefit the patient immediately following surgery.  She verbalized good understanding.      Problem List Patient Active Problem List   Diagnosis Date Noted  . Malignant neoplasm of upper-inner quadrant of right breast in female, estrogen receptor positive ([place]) 09/07/2019  . Osteopenia 05/11/2017  . Asthma, chronic, resolved 04/26/2012  . Right middle lobe syndrome  04/26/2012   Annia Friendly, PT 09/13/19 11:18 AM  [place], [place], [address] Phone: [phone]   Fax:  [phone]  Name: Catherine Long MRN: [id number] Date of Birth: [birth year]

## 2019-09-18 ENCOUNTER — Other Ambulatory Visit: Payer: Self-pay | Admitting: Surgery

## 2019-09-18 DIAGNOSIS — Z853 Personal history of malignant neoplasm of breast: Secondary | ICD-10-CM

## 2019-09-19 ENCOUNTER — Telehealth: Payer: Self-pay | Admitting: Hematology and Oncology

## 2019-09-19 NOTE — Telephone Encounter (Signed)
Per 2/16 sch msg. Left VM with pt's appt date and time

## 2019-09-20 ENCOUNTER — Ambulatory Visit: Payer: Self-pay | Admitting: Genetic Counselor

## 2019-09-20 ENCOUNTER — Encounter: Payer: Self-pay | Admitting: Genetic Counselor

## 2019-09-20 ENCOUNTER — Telehealth: Payer: Self-pay | Admitting: Genetic Counselor

## 2019-09-20 DIAGNOSIS — Z1379 Encounter for other screening for genetic and chromosomal anomalies: Secondary | ICD-10-CM | POA: Insufficient documentation

## 2019-09-20 NOTE — Telephone Encounter (Signed)
LVM that her genetic test results are available and requested that she call back to discuss them.  

## 2019-09-20 NOTE — Telephone Encounter (Signed)
Revealed negative genetic testing.  Discussed that we do not know why she has breast cancer or why there is cancer in the family.  It could be due to a different gene that we are not testing, or our current technology may not be able detect something.  It is also possible that there is a hereditary cause for the cancer in the family that she did not inherit.  It will be important for her to keep in contact with genetics to keep up with whether additional testing may be appropriate in the future.   Genetic testing did identify a variant of uncertain significance (VUS) in the POLE gene called c.1357C>G.  This VUS should not impact her medical management and her result is still considered normal.

## 2019-09-20 NOTE — Progress Notes (Addendum)
HPI:  Catherine Long was previously seen in the [facility] due to a personal and family history of breast cancer and melanoma, as well as a family history of ovarian cancer, and concerns regarding a hereditary predisposition to cancer. Please refer to our prior cancer genetics clinic note for more information regarding our discussion, assessment and recommendations, at the time. Catherine Long recent genetic test results were disclosed to her, as were recommendations warranted by these results. These results and recommendations are discussed in more detail below.  CANCER HISTORY:  Oncology History  Malignant neoplasm of upper-inner quadrant of right breast in female, estrogen receptor positive ([place])  09/07/2019 Initial Diagnosis   Screening mammogram detected a possible right breast mass. Diagnostic mammogram showed two adjacent and nearly contiguous masses at the 2 o'clock position, 1.7cm, no right axillary adenopathy. Biopsy showed invasive mammary carcinoma, grade 2, HER-2 equivocal by IHC, negative by FISH, ER+ 95%, PR+ 90%, Ki67 2%.   09/19/2019 Genetic Testing   Negative genetic testing:  No pathogenic variants detected on the [facility] Breast Cancer STAT panel, Common Hereditary Cancers panel, or Melanoma panel. A variant of uncertain significance (VUS) was detected in the POLE gene called c.1357C>G. The report date is 09/19/2019.  The STAT Breast cancer panel offered by [facility] includes sequencing and rearrangement analysis for the following 9 genes:  ATM, BRCA1, BRCA2, CDH1, CHEK2, PALB2, PTEN, STK11 and TP53. The Common Hereditary Cancers Panel offered by [facility] includes sequencing and/or deletion duplication testing of the following 48 genes: APC, ATM, AXIN2, BARD1, BMPR1A, BRCA1, BRCA2, BRIP1, CDH1, CDK4, CDKN2A (p14ARF), CDKN2A (p16INK4a), CHEK2, CTNNA1, DICER1, EPCAM (Deletion/duplication testing only), GREM1 (promoter region deletion/duplication testing only), KIT, MEN1,  MLH1, MSH2, MSH3, MSH6, MUTYH, NBN, NF1, NHTL1, PALB2, PDGFRA, PMS2, POLD1, POLE, PTEN, RAD50, RAD51C, RAD51D, RNF43, SDHB, SDHC, SDHD, SMAD4, SMARCA4. STK11, TP53, TSC1, TSC2, and VHL.  The following genes were evaluated for sequence changes only: SDHA and HOXB13 c.251G>A variant only. The Melanoma panel offered by [facility] includes sequencing and/or deletion duplication testing of the following 9 genes: BAP1, BRCA2, BRIP1, CDK4, CDKN2A (p14ARF), CDKN2A (p16INK4a), POT1, PTEN, RB1, and TP53.  The following gene was evaluated for sequence changes only: MITF (c.952G>A, p.Glu318Lys variant only).      FAMILY HISTORY:  We obtained a detailed, 4-generation family history.  Significant diagnoses are listed below: Family History  Problem Relation Age of Onset  . Heart disease Father   . Melanoma Father        dx. 14s  . Breast cancer Mother        bilateral, dx. mid-50s  . Melanoma Mother        dx. 7s  . Melanoma Sister        dx. 34s  . Breast cancer Maternal Aunt        dx. >50  . Ovarian cancer Maternal Aunt        dx. >50   Catherine Long has two daughters who are 94 and 47 and have not had cancer. Her younger daughter, Catherine Long, did have a breast lump removed when she was in college, although it was benign. Catherine Long has one brother and five sisters. One sister had melanoma diagnosed in her 29s.   Catherine Long mother is currently in her 72s and has a history of bilateral breast cancer diagnosed in her mid-50s and melanoma diagnosed in her 70s. Catherine Long had two maternal aunts and one maternal uncle. One aunt was diagnosed with breast cancer when she  was older than 90, and the other aunt was diagnosed with ovarian cancer when she was older than 8. Catherine Long is not sure if her uncle had cancer, but notes that he did have many health problems. Her maternal grandparents died in their 21s and did not have a history of cancer. There are no other known diagnoses of cancer on the maternal  side of the family.  Catherine Long father died at age 17 and had a history of melanoma diagnosed in his 85s. She had three paternal aunts and six paternal uncles, all of whom died when they were older than 39 except for one uncle who died in an accident. None had cancer to Catherine Long's knowledge. Her paternal grandfather died when he was around 36 due to heart problems, and her paternal grandmother died shortly afterward, although not from cancer. There are no other known diagnoses of cancer on the paternal side of the family.  Catherine Long is unaware of previous family history of genetic testing for hereditary cancer risks. Her ancestors are of [place], [place], and [place] descent. There is no reported Ashkenazi Jewish ancestry. There is no known consanguinity.  GENETIC TEST RESULTS: Genetic testing reported out on 09/19/2019 through the [facility] Breast Cancer STAT panel, Common Hereditary Cancer panel, and Melanoma panel. No pathogenic variants were detected.   The STAT Breast cancer panel offered by [facility] includes sequencing and rearrangement analysis for the following 9 genes:  ATM, BRCA1, BRCA2, CDH1, CHEK2, PALB2, PTEN, STK11 and TP53.  The Common Hereditary Cancers Panel offered by [facility] includes sequencing and/or deletion duplication testing of the following 48 genes: APC, ATM, AXIN2, BARD1, BMPR1A, BRCA1, BRCA2, BRIP1, CDH1, CDK4, CDKN2A (p14ARF), CDKN2A (p16INK4a), CHEK2, CTNNA1, DICER1, EPCAM (Deletion/duplication testing only), GREM1 (promoter region deletion/duplication testing only), KIT, MEN1, MLH1, MSH2, MSH3, MSH6, MUTYH, NBN, NF1, NHTL1, PALB2, PDGFRA, PMS2, POLD1, POLE, PTEN, RAD50, RAD51C, RAD51D, RNF43, SDHB, SDHC, SDHD, SMAD4, SMARCA4. STK11, TP53, TSC1, TSC2, and VHL.  The following genes were evaluated for sequence changes only: SDHA and HOXB13 c.251G>A variant only. The Melanoma panel offered by [facility] includes sequencing and/or deletion duplication testing of the following 9  genes: BAP1, BRCA2, BRIP1, CDK4, CDKN2A (p14ARF), CDKN2A (p16INK4a), POT1, PTEN, RB1, and TP53.  The following gene was evaluated for sequence changes only: MITF (c.952G>A, p.GLU318Lys variant only). The test report will be scanned into EPIC and located under the Molecular Pathology section of the Results Review tab.  A portion of the result report is included below for reference.     We discussed with Catherine Long that because current genetic testing is not perfect, it is possible there may be a gene mutation in one of these genes that current testing cannot detect, but that chance is small.  We also discussed, that there could be another gene that has not yet been discovered, or that we have not yet tested, that is responsible for the cancer diagnoses in the family. It is also possible there is a hereditary cause for the cancer in the family that Catherine Long did not inherit and therefore was not identified in her testing.  Therefore, it is important to remain in touch with cancer genetics in the future so that we can continue to offer Catherine Long the most up to date genetic testing.   UPDATE:  The POLE c.1357C>G VUS was reclassified to "likely benign" on 02/22/2020.  Genetic testing did identify a variant of uncertain significance (VUS) in the POLE gene called c.1357C>G.  At this time,  it is unknown if this variant is associated with increased cancer risk or if this is a normal finding, but most variants such as this get reclassified to being inconsequential. It should not be used to make medical management decisions. With time, we suspect the lab will determine the significance of this variant, if any. If we do learn more about it, we will try to contact Ms. Allcock to discuss it further. However, it is important to stay in touch with [place] periodically and keep the address and phone number up to date.  CANCER SCREENING RECOMMENDATIONS: Catherine Long test result is considered negative (normal).  This  means that we have not identified a hereditary cause for her personal and family history of cancer at this time.  While reassuring, this does not definitively rule out a hereditary predisposition to cancer. It is still possible that there could be genetic mutations that are undetectable by current technology. There could be genetic mutations in genes that have not been tested or identified to increase cancer risk.  Therefore, it is recommended she continue to follow the cancer management and screening guidelines provided by her oncology and primary healthcare providers.   An individual's cancer risk and medical management are not determined by genetic test results alone. Overall cancer risk assessment incorporates additional factors, including personal medical history, family history, and any available genetic information that may result in a personalized plan for cancer prevention and surveillance.  RECOMMENDATIONS FOR FAMILY MEMBERS:  Individuals in this family might be at some increased risk of developing cancer, over the general population risk, simply due to the family history of cancer.  We recommended women in this family have a yearly mammogram beginning at age 53, or 69 years younger than the earliest onset of cancer, an annual clinical breast exam, and perform monthly breast self-exams. Women in this family should also have a gynecological exam as recommended by their primary provider. All family members should have a colonoscopy by age 33.  It is also possible there is a hereditary cause for the cancer in Catherine Long's family that she did not inherit and therefore was not identified in her.  Based on Catherine Long's family history, we recommended her mother, who was diagnosed with bilateral breast cancer at age 29, have genetic counseling and testing. Ms. Dobis does not believe her mother would be interested in genetic testing, although she is welcome to let us know if we can be of any assistance  in coordinating genetic counseling and/or testing for this family member.   FOLLOW-UP: Lastly, we discussed with Ms. Storbeck that cancer genetics is a rapidly advancing field and it is possible that new genetic tests will be appropriate for her and/or her family members in the future. We encouraged her to remain in contact with cancer genetics on an annual basis so we can update her personal and family histories and let her know of advances in cancer genetics that may benefit this family.   Our contact number was provided. Ms. Scruton questions were answered to her satisfaction, and she knows she is welcome to call us at anytime with additional questions or concerns.   Clint Guy, MS, [facility] Genetic Counselor [email or web address] Phone: [phone]

## 2019-09-21 ENCOUNTER — Telehealth: Payer: Self-pay

## 2019-09-21 ENCOUNTER — Telehealth: Payer: Self-pay | Admitting: *Deleted

## 2019-09-21 NOTE — Telephone Encounter (Signed)
Nutrition Assessment  Reason for Assessment:  Pt attended Breast Clinic on 2/10 and was given nutrition packet by nurse navigator  ASSESSMENT:  69 year old female with breast cancer.  Planning surgery, oncotype, radiation and antiestrogens.  Past medical history of melanoma  Spoke with patient via phone to introduce self and service at [facility].  Patient reports good appetite.  Medications:  Vit c, b complex, calcium and vit d, MVI  Labs: reviewed  Anthropometrics:   Height: 63 inches Weight: 151 lb 12.8 oz BMI: 26   NUTRITION DIAGNOSIS: Food and nutrition related knowledge deficit related to new diagnosis of breast cancer as evidenced by no prior need for nutrition related information.  INTERVENTION:   Discussed briefly packet of information regarding nutritional tips for breast cancer patients.  Questions answered.  Teachback method used.  Contact information provided and patient knows to contact me with questions/concerns.    MONITORING, EVALUATION, and GOAL: Pt will consume a healthy plant based diet to maintain lean body mass throughout treatment.   Argelia Formisano B. Zenia Resides, [place], [place] Registered Dietitian [phone] (pager)

## 2019-09-21 NOTE — Telephone Encounter (Signed)
Spoke with patient to follow up and assess navigation needs from [facility]. Answered questions and encourage her to call with any other concerns or needs. Patient verbalized understanding.

## 2019-09-22 NOTE — Pre-Procedure Instructions (Signed)
Catherine Long  09/22/2019      [id number] - [place], [place] - [address] [place] [address] Phone: [phone] Fax: [phone]  [id number] [place], [place] - [address] [place] [address] Phone: [phone] Fax: [phone]    Your procedure is scheduled on Feb. 24  Report to [facility] Entrance A at 8:00 A.M.  Call this number if you have problems the morning of surgery:  [phone]   Remember:  Do not eat after midnight.   You may drink clear liquids until 7:00.  Clear liquids allowed are:                    Water, Juice (non-citric and without pulp), Carbonated beverages, Clear Tea, Black Coffee only, Plain Jell-O only, Gatorade and Plain Popsicles only                      Please complete your PRE-SURGERY ENSURE that was provided to you by 7:00 A.M.  the morning of surgery.  Please, if able, drink it in one setting. DO NOT SIP.    Take these medicines the morning of surgery with A SIP OF WATER :  None         7 days prior to surgery STOP taking any Aspirin (unless otherwise instructed by your surgeon), Aleve, Naproxen, Ibuprofen, Motrin, Advil, Goody's, BC's, all herbal medications, fish oil, and all vitamins.    Do not wear jewelry, make-up or nail polish.  Do not wear lotions, powders, or perfumes, or deodorant.  Do not shave 48 hours prior to surgery.  Men may shave face and neck.  Do not bring valuables to the hospital.  [facility] is not responsible for any belongings or valuables.  Contacts, dentures or bridgework may not be worn into surgery.  Leave your suitcase in the car.  After surgery it may be brought to your room.  For patients admitted to the hospital, discharge time will be determined by your treatment team.  Patients discharged the day of surgery will not be allowed to drive home.    Special  instructions:  [facility]- Preparing For Surgery  Before surgery, you can play an important role. Because skin is not sterile, your skin needs to be as free of germs as possible. You can reduce the number of germs on your skin by washing with CHG (chlorahexidine gluconate) Soap before surgery.  CHG is an antiseptic cleaner which kills germs and bonds with the skin to continue killing germs even after washing.    Oral Hygiene is also important to reduce your risk of infection.  Remember - BRUSH YOUR TEETH THE MORNING OF SURGERY WITH YOUR REGULAR TOOTHPASTE  Please do not use if you have an allergy to CHG or antibacterial soaps. If your skin becomes reddened/irritated stop using the CHG.  Do not shave (including legs and underarms) for at least 48 hours prior to first CHG shower. It is OK to shave your face.  Please follow these instructions carefully.   1. Shower the NIGHT BEFORE SURGERY and the MORNING OF SURGERY with CHG.   2. If you chose to wash your hair, wash your hair first as usual with your normal shampoo.  3. After you shampoo, rinse your hair and body thoroughly to remove the shampoo.  4. Use CHG as  you would any other liquid soap. You can apply CHG directly to the skin and wash gently with a scrungie or a clean washcloth.   5. Apply the CHG Soap to your body ONLY FROM THE NECK DOWN.  Do not use on open wounds or open sores. Avoid contact with your eyes, ears, mouth and genitals (private parts). Wash Face and genitals (private parts)  with your normal soap.  6. Wash thoroughly, paying special attention to the area where your surgery will be performed.  7. Thoroughly rinse your body with warm water from the neck down.  8. DO NOT shower/wash with your normal soap after using and rinsing off the CHG Soap.  9. Pat yourself dry with a CLEAN TOWEL.  10. Wear CLEAN PAJAMAS to bed the night before surgery, wear comfortable clothes the morning of surgery  11. Place CLEAN SHEETS on  your bed the night of your first shower and DO NOT SLEEP WITH PETS.    Day of Surgery:  Do not apply any deodorants/lotions.  Please wear clean clothes to the hospital/surgery center.   Remember to brush your teeth WITH YOUR REGULAR TOOTHPASTE.    Please read over the following fact sheets that you were given. Coughing and Deep Breathing and Surgical Site Infection Prevention

## 2019-09-23 ENCOUNTER — Other Ambulatory Visit (HOSPITAL_COMMUNITY)
Admission: RE | Admit: 2019-09-23 | Discharge: 2019-09-23 | Disposition: A | Discharge status: Home / Self Care | Payer: Medicare PPO | Source: Ambulatory Visit | Attending: Surgery | Admitting: Surgery

## 2019-09-23 DIAGNOSIS — Z20822 Contact with and (suspected) exposure to covid-19: Secondary | ICD-10-CM | POA: Diagnosis not present

## 2019-09-23 DIAGNOSIS — Z01812 Encounter for preprocedural laboratory examination: Secondary | ICD-10-CM | POA: Diagnosis not present

## 2019-09-23 LAB — SARS CORONAVIRUS 2 (TAT 6-24 HRS): SARS Coronavirus 2: NEGATIVE

## 2019-09-25 ENCOUNTER — Other Ambulatory Visit: Payer: Self-pay

## 2019-09-25 ENCOUNTER — Encounter (HOSPITAL_COMMUNITY): Payer: Self-pay

## 2019-09-25 ENCOUNTER — Encounter (HOSPITAL_COMMUNITY)
Admission: RE | Admit: 2019-09-25 | Discharge: 2019-09-25 | Disposition: A | Discharge status: Home / Self Care | Payer: Medicare PPO | Source: Ambulatory Visit | Attending: Surgery | Admitting: Surgery

## 2019-09-25 DIAGNOSIS — C50911 Malignant neoplasm of unspecified site of right female breast: Secondary | ICD-10-CM | POA: Insufficient documentation

## 2019-09-25 DIAGNOSIS — Z808 Family history of malignant neoplasm of other organs or systems: Secondary | ICD-10-CM | POA: Diagnosis not present

## 2019-09-25 DIAGNOSIS — J45909 Unspecified asthma, uncomplicated: Secondary | ICD-10-CM | POA: Insufficient documentation

## 2019-09-25 DIAGNOSIS — Z803 Family history of malignant neoplasm of breast: Secondary | ICD-10-CM | POA: Diagnosis not present

## 2019-09-25 DIAGNOSIS — Z01812 Encounter for preprocedural laboratory examination: Secondary | ICD-10-CM | POA: Insufficient documentation

## 2019-09-25 DIAGNOSIS — Z01818 Encounter for other preprocedural examination: Secondary | ICD-10-CM | POA: Diagnosis not present

## 2019-09-25 DIAGNOSIS — Z8041 Family history of malignant neoplasm of ovary: Secondary | ICD-10-CM | POA: Diagnosis not present

## 2019-09-25 DIAGNOSIS — Z85820 Personal history of malignant melanoma of skin: Secondary | ICD-10-CM | POA: Insufficient documentation

## 2019-09-25 HISTORY — DX: Unspecified asthma, uncomplicated: J45.909

## 2019-09-25 HISTORY — DX: Other specified postprocedural states: Z98.890

## 2019-09-25 HISTORY — DX: Other specified postprocedural states: R11.2

## 2019-09-25 LAB — CBC
HCT: 43.6 % (ref 36.0–46.0)
Hemoglobin: 13.8 g/dL (ref 12.0–15.0)
MCH: 28.9 pg (ref 26.0–34.0)
MCHC: 31.7 g/dL (ref 30.0–36.0)
MCV: 91.4 fL (ref 80.0–100.0)
Platelets: 315 10*3/uL (ref 150–400)
RBC: 4.77 MIL/uL (ref 3.87–5.11)
RDW: 13.7 % (ref 11.5–15.5)
WBC: 8.8 10*3/uL (ref 4.0–10.5)
nRBC: 0 % (ref 0.0–0.2)

## 2019-09-25 NOTE — Progress Notes (Signed)
PCP - Dr. Jenna Luo Cardiologist - Denies  PPM/ICD - Denies  Chest x-ray - N/A EKG - N/A Stress Test - Denies ECHO - Denies Cardiac Cath - Denies  Sleep Study - Denies  Pt denies being diabetic.   Blood Thinner Instructions: N/A Aspirin Instructions: N/A  ERAS Protcol - Yes, PRE-SURGERY Ensure  COVID TEST- 09/23/2019 - Negative   Coronavirus Screening  Have you experienced the following symptoms:  Cough yes/no: No Fever (>100.33F)  yes/no: No Runny nose yes/no: No Sore throat yes/no: No Difficulty breathing/shortness of breath  yes/no: No  Have you or a family member traveled in the last 14 days and where? yes/no: No   If the patient indicates "YES" to the above questions, their PAT will be rescheduled to limit the exposure to others and, the surgeon will be notified. THE PATIENT WILL NEED TO BE ASYMPTOMATIC FOR 14 DAYS.   If the patient is not experiencing any of these symptoms, the PAT nurse will instruct them to NOT bring anyone with them to their appointment since they may have these symptoms or traveled as well.   Please remind your patients and families that hospital visitation restrictions are in effect and the importance of the restrictions.     Anesthesia review: Yes, type of surgery; abnormal BP  Patient denies shortness of breath, fever, cough and chest pain at PAT appointment   All instructions explained to the patient, with a verbal understanding of the material. Patient agrees to go over the instructions while at home for a better understanding. Patient also instructed to self quarantine after being tested for COVID-19. The opportunity to ask questions was provided.

## 2019-09-26 ENCOUNTER — Ambulatory Visit
Admission: RE | Admit: 2019-09-26 | Discharge: 2019-09-26 | Disposition: A | Discharge status: Home / Self Care | Payer: Medicare PPO | Source: Ambulatory Visit | Attending: Surgery | Admitting: Surgery

## 2019-09-26 ENCOUNTER — Inpatient Hospital Stay: Admission: RE | Admit: 2019-09-26 | Payer: Medicare PPO | Source: Ambulatory Visit

## 2019-09-26 ENCOUNTER — Ambulatory Visit: Payer: Self-pay

## 2019-09-26 DIAGNOSIS — Z853 Personal history of malignant neoplasm of breast: Secondary | ICD-10-CM

## 2019-09-26 DIAGNOSIS — C50211 Malignant neoplasm of upper-inner quadrant of right female breast: Secondary | ICD-10-CM | POA: Diagnosis not present

## 2019-09-26 NOTE — Progress Notes (Signed)
Anesthesia Chart Review:  Case: [id number] Date/Time: 09/27/19 0945   Procedure: RIGHT RADIOACTIVE SEED GUIDED PARTIAL MASTECTOMY WITH  SENTINEL LYMPH NODE BIOPSY (Right Breast) - LMA   Anesthesia type: General   Pre-op diagnosis: RIGHT BREAST CANCER   Location: [place] [facility] OR ROOM 08 / [place] OR   Surgeons: [name], MD     [place] 09/26/19 at 1:15 PM  DISCUSSION: Patient is a 69 year old female scheduled for the above procedure.  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   09/23/2019 preoperative COVID-19 test was negative.  Anesthesia team to evaluate on the day of surgery.   VS: BP (!) 157/80   Pulse 81   Temp 36.5 C   Resp 18   Ht 5\' 3"  (1.6 m)   Wt 69.2 kg   SpO2 100%   BMI 27.01 kg/m   PROVIDERS: [name], MD is PCP [name], MD is HEM-ONC [name], MD is RAD-ONC PRN pulmonology follow-up recommended at 06/01/14 visit with Christinia Gully, MD   LABS: Labs reviewed: Acceptable for surgery. She also had an unremarkable CMET on 09/23/19 (WNL except glucose 101). (all labs ordered are listed, but only abnormal results are displayed)  Labs Reviewed  CBC    EKG: N/A   CV: N/A  Past Medical History:  Diagnosis Date  . Asthma    several years ago  . Family history of breast cancer   . Family history of melanoma   . Family history of ovarian cancer   . History of melanoma   . Melanoma ([place])   . PONV (postoperative nausea and vomiting)   . Right middle lobe syndrome     Past Surgical History:  Procedure Laterality Date  . TONSILLECTOMY    . TOTAL HIP ARTHROPLASTY  2003 and 2006   Bilateral    MEDICATIONS: . Ascorbic Acid (VITAMIN C) 1000 MG tablet  . B Complex-C (SUPER B COMPLEX) TABS  . Calcium Carbonate-Vitamin D3 (CALCIUM 600/VITAMIN D) 600-400 MG-UNIT TABS  . Cholecalciferol (VITAMIN D) 50 MCG (2000 UT) tablet  .  Hypromellose (SYSTANE OVERNIGHT THERAPY OP)  . Multiple Vitamins-Minerals (MULTI COMPLETE/IRON PO)  . Turmeric 500 MG CAPS   No current facility-administered medications for this encounter.    Myra Gianotti, PA-C Surgical Short Stay/Anesthesiology [facility] Phone [phone] [facility] Phone [phone] 09/26/2019 11:00 AM

## 2019-09-26 NOTE — H&P (Signed)
Catherine Long Documented: 09/13/2019 2:52 PM Location: [facility] Patient #: [id number] DOB: [birth year] Widowed / Language: Cleophus Molt / Race: White Female   History of Present Illness (Catherine Long A. Ninfa Linden MD; 09/13/2019 3:31 PM) The patient is a 69 year old female who presents with breast cancer. This is a 69 year old female with a new diagnosis of a right breast cancer. She was found on recent screening mammography to have an abnormal area in the right breast at the 2 o'clock position 2 cm from the nipple that measured just under 2 cm. She underwent a stereotactic biopsy of this showing an invasive breast cancer. She now presents for consideration of surgery. She has had no previous problems with her breasts. She denies nipple discharge. Her mother was diagnosed with breast cancer in her 28s and is currently in her 75s doing well. She has no previous history of cardiopulmonary issues. She has already been seen by medical and radiation oncology preoperatively.   Past Surgical History (Tanisha A. Owens Shark, [place]; 09/13/2019 2:52 PM) Colon Polyp Removal - Colonoscopy  Hip Surgery  Bilateral.  Diagnostic Studies History (Tanisha A. Owens Shark, [place]; 09/13/2019 2:52 PM) Colonoscopy  1-5 years ago Mammogram  within last year Pap Smear  1-5 years ago  Allergies (Tanisha A. Owens Shark, [place]; 09/13/2019 2:53 PM) No Known Drug Allergies [09/13/2019]: Allergies Reconciled   Medication History (Tanisha A. Owens Shark, [place]; 09/13/2019 2:53 PM) No Current Medications Medications Reconciled  Social History (Tanisha A. Owens Shark, [place]; 09/13/2019 2:52 PM) Alcohol use  Occasional alcohol use. Caffeine use  Coffee, Tea. No drug use  Tobacco use  Never smoker.  Family History (Tanisha A. Owens Shark, [place]; 09/13/2019 2:52 PM) Arthritis  Mother. Breast Cancer  Family Members In General, Mother. Diabetes Mellitus  Father. Melanoma  Father, Mother, Sister. Migraine Headache  Family Members In  General.  Pregnancy / Birth History (Tanisha A. Owens Shark, [place]; 09/13/2019 2:52 PM) Age at menarche  77 years. Age of menopause  18-50 Gravida  2 Length (months) of breastfeeding  7-12 Maternal age  96-30 Para  2  Other Problems (Tanisha A. Owens Shark, [place]; 09/13/2019 2:52 PM) Arthritis  Breast Cancer  Melanoma     Review of Systems (Tanisha A. Brown RMA; 09/13/2019 2:53 PM) General Not Present- Appetite Loss, Chills, Fatigue, Fever, Night Sweats, Weight Gain and Weight Loss. Skin Not Present- Change in Wart/Mole, Dryness, Hives, Jaundice, New Lesions, Non-Healing Wounds, Rash and Ulcer. HEENT Not Present- Earache, Hearing Loss, Hoarseness, Nose Bleed, Oral Ulcers, Ringing in the Ears, Seasonal Allergies, Sinus Pain, Sore Throat, Visual Disturbances, Wears glasses/contact lenses and Yellow Eyes. Respiratory Not Present- Bloody sputum, Chronic Cough, Difficulty Breathing, Snoring and Wheezing. Breast Not Present- Breast Mass, Breast Pain, Nipple Discharge and Skin Changes. Cardiovascular Not Present- Chest Pain, Difficulty Breathing Lying Down, Leg Cramps, Palpitations, Rapid Heart Rate, Shortness of Breath and Swelling of Extremities. Gastrointestinal Not Present- Abdominal Pain, Bloating, Bloody Stool, Change in Bowel Habits, Chronic diarrhea, Constipation, Difficulty Swallowing, Excessive gas, Gets full quickly at meals, Hemorrhoids, Indigestion, Nausea, Rectal Pain and Vomiting. Female Genitourinary Not Present- Frequency, Nocturia, Painful Urination, Pelvic Pain and Urgency. Musculoskeletal Not Present- Back Pain, Joint Pain, Joint Stiffness, Muscle Pain, Muscle Weakness and Swelling of Extremities. Neurological Not Present- Decreased Memory, Fainting, Headaches, Numbness, Seizures, Tingling, Tremor, Trouble walking and Weakness. Psychiatric Not Present- Anxiety, Bipolar, Change in Sleep Pattern, Depression, Fearful and Frequent crying. Endocrine Not Present- Cold Intolerance, Excessive  Hunger, Hair Changes, Heat Intolerance, Hot flashes and New Diabetes. Hematology Present- Easy Bruising.  Not Present- Blood Thinners, Excessive bleeding, Gland problems, HIV and Persistent Infections.  Vitals (Tanisha A. Brown RMA; 09/13/2019 2:53 PM) 09/13/2019 2:53 PM Weight: 155.4 lb Height: 63in Body Surface Area: 1.74 m Body Mass Index: 27.53 kg/m  Temp.: 98.73F  Pulse: 86 (Regular)  BP: 136/84 (Sitting, Left Arm, Standard)       Physical Exam (Kail Fraley A. Ninfa Linden MD; 09/13/2019 3:32 PM) The physical exam findings are as follows: Note:She is well on examination  There are no palpable breast masses. There is mild ecchymosis in the right breast from her biopsy. There is no axillary or supraclavicular adenopathy.    Assessment & Plan (Corinda Ammon A. Ninfa Linden MD; 09/13/2019 3:35 PM)  BREAST CANCER, RIGHT (C50.911)  Impression: This is a patient with an invasive right breast mammary carcinoma. I have reviewed her pathology results showing grade 2 HER-2 equivocal ER positive PR positive breast cancer. I've extensively reviewed her mammograms and ultrasound. I have also reviewed the notes from both medical and radiation oncology. She was discussed in our multidisciplinary breast cancer, versus well. I had discussion with the patient and her family regarding the diagnosis. We discussed multiple treatment options. She is undergoing genetic testing. Discussed breast conservation versus mastectomy. We have recommended a radioactive seed guided right breast partial mastectomy and sentinel lymph node biopsy. I discussed the techniques of the procedure with the patient in detail. We discussed the risks of the procedure which includes but is not limited to bleeding, infection, the need for further surgery if the margins or lymph nodes are positive, injury to surrounding structures, cardiopulmonary issues, postop recovery, etc. After long discussion, she wished to proceed with surgery which  will be scheduled.  Because of discussion regarding her malignancy and treatment options, this is a moderate complex decision making

## 2019-09-26 NOTE — Anesthesia Preprocedure Evaluation (Addendum)
Anesthesia Evaluation  Patient identified by MRN, date of birth, ID band Patient awake    Reviewed: Allergy & Precautions, NPO status , Patient's Chart, lab work & pertinent test results  History of Anesthesia Complications (+) PONV  Airway Mallampati: I  TM Distance: >3 FB Neck ROM: Full    Dental   Pulmonary    Pulmonary exam normal        Cardiovascular Normal cardiovascular exam     Neuro/Psych    GI/Hepatic   Endo/Other    Renal/GU      Musculoskeletal   Abdominal   Peds  Hematology   Anesthesia Other Findings   Reproductive/Obstetrics                             Anesthesia Physical Anesthesia Plan  ASA: II  Anesthesia Plan: General   Post-op Pain Management:  Regional for Post-op pain   Induction:   PONV Risk Score and Plan: 4 or greater and Ondansetron, Dexamethasone, Scopolamine patch - Pre-op and Midazolam  Airway Management Planned: LMA  Additional Equipment:   Intra-op Plan:   Post-operative Plan: Extubation in OR  Informed Consent: I have reviewed the patients History and Physical, chart, labs and discussed the procedure including the risks, benefits and alternatives for the proposed anesthesia with the patient or authorized representative who has indicated his/her understanding and acceptance.       Plan Discussed with: CRNA and Surgeon  Anesthesia Plan Comments: (PAT note written 09/26/2019 by Myra Gianotti, PA-C. )       Anesthesia Quick Evaluation

## 2019-09-27 ENCOUNTER — Encounter (HOSPITAL_COMMUNITY)
Admission: RE | Disposition: A | Discharge status: Home / Self Care | Payer: Self-pay | Source: Home / Self Care | Attending: Surgery

## 2019-09-27 ENCOUNTER — Ambulatory Visit (HOSPITAL_COMMUNITY)
Admission: RE | Admit: 2019-09-27 | Discharge: 2019-09-27 | Disposition: A | Discharge status: Home / Self Care | Payer: Medicare PPO | Attending: Surgery | Admitting: Surgery

## 2019-09-27 ENCOUNTER — Other Ambulatory Visit: Payer: Self-pay

## 2019-09-27 ENCOUNTER — Ambulatory Visit (HOSPITAL_COMMUNITY): Payer: Medicare PPO | Admitting: Physician Assistant

## 2019-09-27 ENCOUNTER — Ambulatory Visit (HOSPITAL_COMMUNITY): Payer: Medicare PPO | Admitting: Certified Registered Nurse Anesthetist

## 2019-09-27 ENCOUNTER — Ambulatory Visit (HOSPITAL_COMMUNITY)
Admission: RE | Admit: 2019-09-27 | Discharge: 2019-09-27 | Disposition: A | Discharge status: Home / Self Care | Payer: Medicare PPO | Source: Ambulatory Visit | Attending: Surgery | Admitting: Surgery

## 2019-09-27 ENCOUNTER — Ambulatory Visit
Admission: RE | Admit: 2019-09-27 | Discharge: 2019-09-27 | Disposition: A | Discharge status: Home / Self Care | Payer: Medicare PPO | Source: Ambulatory Visit | Attending: Surgery | Admitting: Surgery

## 2019-09-27 DIAGNOSIS — C50911 Malignant neoplasm of unspecified site of right female breast: Secondary | ICD-10-CM | POA: Diagnosis not present

## 2019-09-27 DIAGNOSIS — R928 Other abnormal and inconclusive findings on diagnostic imaging of breast: Secondary | ICD-10-CM | POA: Diagnosis not present

## 2019-09-27 DIAGNOSIS — Z170 Estrogen receptor positive status [ER+]: Secondary | ICD-10-CM | POA: Insufficient documentation

## 2019-09-27 DIAGNOSIS — Z803 Family history of malignant neoplasm of breast: Secondary | ICD-10-CM | POA: Insufficient documentation

## 2019-09-27 DIAGNOSIS — Z853 Personal history of malignant neoplasm of breast: Secondary | ICD-10-CM

## 2019-09-27 DIAGNOSIS — Z85820 Personal history of malignant melanoma of skin: Secondary | ICD-10-CM | POA: Insufficient documentation

## 2019-09-27 DIAGNOSIS — C50211 Malignant neoplasm of upper-inner quadrant of right female breast: Secondary | ICD-10-CM | POA: Insufficient documentation

## 2019-09-27 DIAGNOSIS — G8918 Other acute postprocedural pain: Secondary | ICD-10-CM | POA: Diagnosis not present

## 2019-09-27 HISTORY — PX: RADIOACTIVE SEED GUIDED PARTIAL MASTECTOMY WITH AXILLARY SENTINEL LYMPH NODE BIOPSY: SHX6520

## 2019-09-27 SURGERY — RADIOACTIVE SEED GUIDED PARTIAL MASTECTOMY WITH AXILLARY SENTINEL LYMPH NODE BIOPSY
Anesthesia: General | Site: Breast | Laterality: Right

## 2019-09-27 MED ORDER — MIDAZOLAM HCL 2 MG/2ML IJ SOLN
INTRAMUSCULAR | Status: AC
  Administered 2019-09-27: 2 mg via INTRAVENOUS
  Filled 2019-09-27: qty 2

## 2019-09-27 MED ORDER — CEFAZOLIN SODIUM-DEXTROSE 2-4 GM/100ML-% IV SOLN
INTRAVENOUS | Status: AC
  Filled 2019-09-27: qty 100

## 2019-09-27 MED ORDER — TRAMADOL HCL 50 MG PO TABS: 50.0000 mg | ORAL_TABLET | Freq: Four times a day (QID) | ORAL | 1 refills | Status: DC | PRN

## 2019-09-27 MED ORDER — GABAPENTIN 300 MG PO CAPS
ORAL_CAPSULE | ORAL | Status: AC
  Administered 2019-09-27: 300 mg via ORAL
  Filled 2019-09-27: qty 1

## 2019-09-27 MED ORDER — MIDAZOLAM HCL 2 MG/2ML IJ SOLN: 2.0000 mg | Freq: Once | INTRAMUSCULAR | Status: AC

## 2019-09-27 MED ORDER — FENTANYL CITRATE (PF) 100 MCG/2ML IJ SOLN
INTRAMUSCULAR | Status: AC
  Administered 2019-09-27: 100 ug via INTRAVENOUS
  Filled 2019-09-27: qty 2

## 2019-09-27 MED ORDER — BUPIVACAINE-EPINEPHRINE (PF) 0.5% -1:200000 IJ SOLN
INTRAMUSCULAR | Status: DC | PRN
  Administered 2019-09-27: 30 mL

## 2019-09-27 MED ORDER — BUPIVACAINE HCL (PF) 0.25 % IJ SOLN
INTRAMUSCULAR | Status: DC | PRN
  Administered 2019-09-27: 26 mL

## 2019-09-27 MED ORDER — PROPOFOL 10 MG/ML IV BOLUS
INTRAVENOUS | Status: DC | PRN
  Administered 2019-09-27: 150 mg via INTRAVENOUS

## 2019-09-27 MED ORDER — ONDANSETRON HCL 4 MG/2ML IJ SOLN: 4.0000 mg | Freq: Once | INTRAMUSCULAR | Status: DC | PRN

## 2019-09-27 MED ORDER — FENTANYL CITRATE (PF) 100 MCG/2ML IJ SOLN
INTRAMUSCULAR | Status: DC | PRN
  Administered 2019-09-27 (×2): 50 ug via INTRAVENOUS

## 2019-09-27 MED ORDER — FENTANYL CITRATE (PF) 100 MCG/2ML IJ SOLN: 100.0000 ug | Freq: Once | INTRAMUSCULAR | Status: AC

## 2019-09-27 MED ORDER — SCOPOLAMINE 1 MG/3DAYS TD PT72
MEDICATED_PATCH | TRANSDERMAL | Status: AC
  Filled 2019-09-27: qty 1

## 2019-09-27 MED ORDER — STERILE WATER FOR IRRIGATION IR SOLN
Status: DC | PRN
  Administered 2019-09-27: 1000 mL

## 2019-09-27 MED ORDER — ACETAMINOPHEN 500 MG PO TABS: 1000.0000 mg | ORAL_TABLET | ORAL | Status: AC

## 2019-09-27 MED ORDER — LACTATED RINGERS IV SOLN: INTRAVENOUS | Status: DC

## 2019-09-27 MED ORDER — METHYLENE BLUE 0.5 % INJ SOLN
INTRAVENOUS | Status: AC
  Filled 2019-09-27: qty 10

## 2019-09-27 MED ORDER — FENTANYL CITRATE (PF) 250 MCG/5ML IJ SOLN
INTRAMUSCULAR | Status: AC
  Filled 2019-09-27: qty 5

## 2019-09-27 MED ORDER — SODIUM CHLORIDE (PF) 0.9 % IJ SOLN
INTRAMUSCULAR | Status: AC
  Filled 2019-09-27: qty 10

## 2019-09-27 MED ORDER — CEFAZOLIN SODIUM-DEXTROSE 2-4 GM/100ML-% IV SOLN
2.0000 g | INTRAVENOUS | Status: AC
  Administered 2019-09-27: 2 g via INTRAVENOUS

## 2019-09-27 MED ORDER — PROPOFOL 10 MG/ML IV BOLUS
INTRAVENOUS | Status: AC
  Filled 2019-09-27: qty 20

## 2019-09-27 MED ORDER — BUPIVACAINE HCL (PF) 0.25 % IJ SOLN
INTRAMUSCULAR | Status: AC
  Filled 2019-09-27: qty 30

## 2019-09-27 MED ORDER — HYDROMORPHONE HCL 1 MG/ML IJ SOLN: 0.2500 mg | INTRAMUSCULAR | Status: DC | PRN

## 2019-09-27 MED ORDER — TECHNETIUM TC 99M SULFUR COLLOID FILTERED
1.0000 | Freq: Once | INTRAVENOUS | Status: AC | PRN
  Administered 2019-09-27: 1 via INTRADERMAL

## 2019-09-27 MED ORDER — GABAPENTIN 300 MG PO CAPS: 300.0000 mg | ORAL_CAPSULE | ORAL | Status: AC

## 2019-09-27 MED ORDER — PROPOFOL 500 MG/50ML IV EMUL
INTRAVENOUS | Status: DC | PRN
  Administered 2019-09-27: 100 ug/kg/min via INTRAVENOUS

## 2019-09-27 MED ORDER — LIDOCAINE HCL (CARDIAC) PF 100 MG/5ML IV SOSY
PREFILLED_SYRINGE | INTRAVENOUS | Status: DC | PRN
  Administered 2019-09-27: 100 mg via INTRAVENOUS

## 2019-09-27 MED ORDER — 0.9 % SODIUM CHLORIDE (POUR BTL) OPTIME
TOPICAL | Status: DC | PRN
  Administered 2019-09-27: 1000 mL

## 2019-09-27 MED ORDER — ACETAMINOPHEN 500 MG PO TABS
ORAL_TABLET | ORAL | Status: AC
  Administered 2019-09-27: 1000 mg via ORAL
  Filled 2019-09-27: qty 2

## 2019-09-27 MED ORDER — ENSURE PRE-SURGERY PO LIQD: 296.0000 mL | Freq: Once | ORAL | Status: DC

## 2019-09-27 MED ORDER — TRAMADOL HCL 50 MG PO TABS: 50.0000 mg | ORAL_TABLET | Freq: Four times a day (QID) | ORAL | 0 refills | Status: DC | PRN

## 2019-09-27 MED ORDER — CHLORHEXIDINE GLUCONATE CLOTH 2 % EX PADS: 6.0000 | MEDICATED_PAD | Freq: Once | CUTANEOUS | Status: DC

## 2019-09-27 MED ORDER — MEPERIDINE HCL 25 MG/ML IJ SOLN: 6.2500 mg | INTRAMUSCULAR | Status: DC | PRN

## 2019-09-27 MED ORDER — SCOPOLAMINE 1 MG/3DAYS TD PT72
MEDICATED_PATCH | TRANSDERMAL | Status: DC | PRN
  Administered 2019-09-27: 1 via TRANSDERMAL

## 2019-09-27 MED ORDER — DEXAMETHASONE SODIUM PHOSPHATE 4 MG/ML IJ SOLN
INTRAMUSCULAR | Status: DC | PRN
  Administered 2019-09-27: 4 mg via INTRAVENOUS

## 2019-09-27 SURGICAL SUPPLY — 42 items
APPLIER CLIP 9.375 MED OPEN (MISCELLANEOUS) ×2
BINDER BREAST XLRG (GAUZE/BANDAGES/DRESSINGS) ×1 IMPLANT
CANISTER SUCT 3000ML PPV (MISCELLANEOUS) ×1 IMPLANT
CHLORAPREP W/TINT 26 (MISCELLANEOUS) ×2 IMPLANT
CLIP APPLIE 9.375 MED OPEN (MISCELLANEOUS) ×1 IMPLANT
CNTNR URN SCR LID CUP LEK RST (MISCELLANEOUS) IMPLANT
CONT SPEC 4OZ STRL OR WHT (MISCELLANEOUS) ×1
COVER PROBE W GEL 5X96 (DRAPES) ×2 IMPLANT
COVER SURGICAL LIGHT HANDLE (MISCELLANEOUS) ×2 IMPLANT
DERMABOND ADVANCED (GAUZE/BANDAGES/DRESSINGS) ×1
DERMABOND ADVANCED .7 DNX12 (GAUZE/BANDAGES/DRESSINGS) ×1 IMPLANT
DEVICE DUBIN MAMMOGRAPHY ×1 IMPLANT
DRAPE CHEST BREAST 15X10 FENES (DRAPES) ×2 IMPLANT
ELECT CAUTERY BLADE 6.4 (BLADE) ×2 IMPLANT
ELECT REM PT RETURN 9FT ADLT (ELECTROSURGICAL) ×2
ELECTRODE REM PT RTRN 9FT ADLT (ELECTROSURGICAL) ×1 IMPLANT
GLOVE BIO SURGEON STRL SZ 6 (GLOVE) ×1 IMPLANT
GLOVE BIO SURGEON STRL SZ7 (GLOVE) ×1 IMPLANT
GLOVE BIOGEL PI IND STRL 6.5 (GLOVE) IMPLANT
GLOVE BIOGEL PI IND STRL 7.0 (GLOVE) IMPLANT
GLOVE BIOGEL PI IND STRL 7.5 (GLOVE) IMPLANT
GLOVE BIOGEL PI INDICATOR 6.5 (GLOVE) ×1
GLOVE BIOGEL PI INDICATOR 7.0 (GLOVE) ×1
GLOVE BIOGEL PI INDICATOR 7.5 (GLOVE) ×1
GLOVE SURG SIGNA 7.5 PF LTX (GLOVE) ×4 IMPLANT
GLOVE SURG SS PI 6.5 STRL IVOR (GLOVE) ×1 IMPLANT
GOWN STRL REUS W/ TWL LRG LVL3 (GOWN DISPOSABLE) ×1 IMPLANT
GOWN STRL REUS W/ TWL XL LVL3 (GOWN DISPOSABLE) ×1 IMPLANT
GOWN STRL REUS W/TWL LRG LVL3 (GOWN DISPOSABLE) ×3
GOWN STRL REUS W/TWL XL LVL3 (GOWN DISPOSABLE) ×1
KIT BASIN OR (CUSTOM PROCEDURE TRAY) ×2 IMPLANT
KIT MARKER MARGIN INK (KITS) ×2 IMPLANT
NDL HYPO 25GX1X1/2 BEV (NEEDLE) ×1 IMPLANT
NEEDLE HYPO 25GX1X1/2 BEV (NEEDLE) ×2 IMPLANT
NS IRRIG 1000ML POUR BTL (IV SOLUTION) ×1 IMPLANT
PACK GENERAL/GYN (CUSTOM PROCEDURE TRAY) ×2 IMPLANT
PAD ABD 8X10 STRL (GAUZE/BANDAGES/DRESSINGS) ×1 IMPLANT
SUT MNCRL AB 4-0 PS2 18 (SUTURE) ×3 IMPLANT
SUT VIC AB 3-0 SH 18 (SUTURE) ×2 IMPLANT
SYR CONTROL 10ML LL (SYRINGE) ×2 IMPLANT
TOWEL GREEN STERILE (TOWEL DISPOSABLE) ×2 IMPLANT
TOWEL GREEN STERILE FF (TOWEL DISPOSABLE) ×2 IMPLANT

## 2019-09-27 NOTE — Op Note (Signed)
   Catherine Long 09/27/2019   Pre-op Diagnosis: RIGHT BREAST CANCER     Post-op Diagnosis: same  Procedure(s): RADIOACTIVE SEED GUIDED RIGHT BREAST PARTIAL MASTECTOMY WITH DEEP RIGHT AXILLARY SENTINEL LYMPH NODE BIOPSY  Surgeon(s): [name], MD Carlena Hurl, PA-C  Anesthesia: General  Staff:  Circulator: Rozell Searing, RN Relief Circulator: Cyd Silence, RN Scrub Person: Gleason, Chloe, RN; Rolan Bucco Circulator Assistant: Johnanna Schneiders, RN; Cyd Silence, RN  Estimated Blood Loss: Minimal               Specimens: sent to path  Indications: This is a 69 year old female found to have an abnormality in the right breast on screening mammography at the 2 o'clock position.  She underwent stereotactic biopsy showing an invasive ductal carcinoma.  The decision was made to proceed with a radioactive seed guided right breast partial mastectomy and sentinel lymph node biopsy  Procedure: The patient was brought to the operating room and identifies correct patient.  She is placed upon the operating table general anesthesia was induced.  Her right breast and axilla were prepped and draped in usual sterile fashion.  She had already had the radioactive isotope injected around the areola by the radiation technologist in the preoperative holding area.  Using the neoprobe, identified the radioactive seed at the 2 o'clock position of the breast several centimeters from the nipple areolar complex.  I anesthetized skin around the medial upper edge of the areola with Marcaine and then made a circumareolar incision with a scalpel.  With the aid of the neoprobe I then dissected toward the radioactive seed staying just underneath the skin until I was medial to the seed.  I then performed a wide partial mastectomy going down to the chest wall and back to the nipple areolar complex with the cautery again used the neoprobe to stay around the seed.  Once the specimen  was excised, I marked the margins with marker paint and then x-rayed the specimen.  The x-ray confirmed that the radioactive seed and previous tissue marker were in the lumpectomy specimen.  This was sent to pathology for identification.  We then used the neoprobe to identify an area of increased uptake in the right axilla.  I made an incision with a scalpel.  I then dissected into the deep axillary tissue and found 1 large lymph node.  I excised the fat around this lymph node as well and there may have been another node in the specimen which was sent to pathology.  There was no increased uptake in the nodal basin after removal of the sentinel nodes.  Hemostasis was achieved with cautery.  I did have to clip a bridging vessel with multiple surgical clips.  Hemostasis peer to be achieved.  I anesthetized the partial mastectomy cavity with further Marcaine and placed surgical clips for marking purposes.  I then closed subcutaneous tissue with interrupted 3-0 Vicryl sutures both incisions and closed skin with a running 4-0 Monocryl.  Dermabond was then applied.  The patient tolerated the procedure well.  She is placed in a breast binder.  All counts were correct at the end of the procedure.  She was then extubated in the operating room and taken in stable condition to the recovery room.          [name]   Date: 09/27/2019  Time: 10:52 AM

## 2019-09-27 NOTE — Anesthesia Procedure Notes (Signed)
Procedure Name: LMA Insertion Date/Time: 09/27/2019 10:11 AM Performed by: Oletta Lamas, CRNA Pre-anesthesia Checklist: Patient identified, Emergency Drugs available, Suction available and Patient being monitored Patient Re-evaluated:Patient Re-evaluated prior to induction Oxygen Delivery Method: Circle System Utilized Preoxygenation: Pre-oxygenation with 100% oxygen Induction Type: IV induction Ventilation: Mask ventilation without difficulty LMA: LMA inserted LMA Size: 4.0 Number of attempts: 1 Placement Confirmation: positive ETCO2 Tube secured with: Tape Dental Injury: Teeth and Oropharynx as per pre-operative assessment

## 2019-09-27 NOTE — Interval H&P Note (Signed)
History and Physical Interval Note: no change in H and P   09/27/2019 9:16 AM  Catherine Long  has presented today for surgery, with the diagnosis of RIGHT BREAST CANCER.  The various methods of treatment have been discussed with the patient and family. After consideration of risks, benefits and other options for treatment, the patient has consented to  Procedure(s) with comments: RIGHT RADIOACTIVE SEED GUIDED PARTIAL MASTECTOMY WITH  SENTINEL LYMPH NODE BIOPSY (Right) - LMA as a surgical intervention.  The patient's history has been reviewed, patient examined, no change in status, stable for surgery.  I have reviewed the patient's chart and labs.  Questions were answered to the patient's satisfaction.     [name]

## 2019-09-27 NOTE — Discharge Instructions (Signed)
[place] Office Phone Number [phone]  BREAST BIOPSY/ PARTIAL MASTECTOMY: POST OP INSTRUCTIONS  Always review your discharge instruction sheet given to you by the facility where your surgery was performed.  IF YOU HAVE DISABILITY OR FAMILY LEAVE FORMS, YOU MUST BRING THEM TO THE OFFICE FOR PROCESSING.  DO NOT GIVE THEM TO YOUR DOCTOR.  1. A prescription for pain medication may be given to you upon discharge.  Take your pain medication as prescribed, if needed.  If narcotic pain medicine is not needed, then you may take acetaminophen (Tylenol) or ibuprofen (Advil) as needed. 2. Take your usually prescribed medications unless otherwise directed 3. If you need a refill on your pain medication, please contact your pharmacy.  They will contact our office to request authorization.  Prescriptions will not be filled after 5pm or on week-ends. 4. You should eat very light the first 24 hours after surgery, such as soup, crackers, pudding, etc.  Resume your normal diet the day after surgery. 5. Most patients will experience some swelling and bruising in the breast.  Ice packs and a good support bra will help.  Swelling and bruising can take several days to resolve.  6. It is common to experience some constipation if taking pain medication after surgery.  Increasing fluid intake and taking a stool softener will usually help or prevent this problem from occurring.  A mild laxative (Milk of Magnesia or Miralax) should be taken according to package directions if there are no bowel movements after 48 hours. 7. Unless discharge instructions indicate otherwise, you may remove your bandages 24-48 hours after surgery, and you may shower at that time.  You may have steri-strips (small skin tapes) in place directly over the incision.  These strips should be left on the skin for 7-10 days.  If your surgeon used skin glue on the incision, you may shower in 24 hours.  The glue will flake off over the  next 2-3 weeks.  Any sutures or staples will be removed at the office during your follow-up visit. 8. ACTIVITIES:  You may resume regular daily activities (gradually increasing) beginning the next day.  Wearing a good support bra or sports bra minimizes pain and swelling.  You may have sexual intercourse when it is comfortable. a. You may drive when you no longer are taking prescription pain medication, you can comfortably wear a seatbelt, and you can safely maneuver your car and apply brakes. b. RETURN TO WORK:  ______________________________________________________________________________________ 9. You should see your doctor in the office for a follow-up appointment approximately two weeks after your surgery.  Your doctor's nurse will typically make your follow-up appointment when she calls you with your pathology report.  Expect your pathology report 2-3 business days after your surgery.  You may call to check if you do not hear from [place] after three days. 10. OTHER INSTRUCTIONS: OK TO REMOVE BREAST BINDER AND SHOWER STARTING TOMORROW 11. ICE PACK, TYLENOL, AND IBUPROFEN ALSO FOR PAIN 12. NO VIGOROUS ACTIVITY FOR ONE WEEK _______________________________________________________________________________________________ _____________________________________________________________________________________________________________________________________ _____________________________________________________________________________________________________________________________________ _____________________________________________________________________________________________________________________________________  WHEN TO CALL YOUR DOCTOR: 1. Fever over 101.0 2. Nausea and/or vomiting. 3. Extreme swelling or bruising. 4. Continued bleeding from incision. 5. Increased pain, redness, or drainage from the incision.  The clinic staff is available to answer your questions during regular business hours.   Please don't hesitate to call and ask to speak to one of the nurses for clinical concerns.  If you have a medical emergency, go to the nearest emergency room  or call 911.  A surgeon from [facility] is always on call at the hospital.  For further questions, please visit [email or web address]

## 2019-09-27 NOTE — Anesthesia Postprocedure Evaluation (Signed)
Anesthesia Post Note  Patient: Catherine Long  Procedure(s) Performed: RIGHT RADIOACTIVE SEED GUIDED LUMPECTOMY WITH  SENTINEL LYMPH NODE BIOPSY (Right Breast)     Patient location during evaluation: PACU Anesthesia Type: General Level of consciousness: awake and alert Pain management: pain level controlled Vital Signs Assessment: post-procedure vital signs reviewed and stable Respiratory status: spontaneous breathing, nonlabored ventilation, respiratory function stable and patient connected to nasal cannula oxygen Cardiovascular status: blood pressure returned to baseline and stable Postop Assessment: no apparent nausea or vomiting Anesthetic complications: no    Last Vitals:  Vitals:   09/27/19 1155 09/27/19 1210  BP: 127/66 135/72  Pulse: 64 75  Resp: 15 16  Temp:  36.4 C  SpO2: 96% 99%    Last Pain:  Vitals:   09/27/19 1210  TempSrc:   PainSc: 2                  Lawayne Hartig DAVID

## 2019-09-27 NOTE — Transfer of Care (Signed)
Immediate Anesthesia Transfer of Care Note  Patient: Catherine Long  Procedure(s) Performed: RIGHT RADIOACTIVE SEED GUIDED LUMPECTOMY WITH  SENTINEL LYMPH NODE BIOPSY (Right Breast)  Patient Location: PACU  Anesthesia Type:General  Level of Consciousness: awake, alert , oriented and patient cooperative  Airway & Oxygen Therapy: Patient Spontanous Breathing  Post-op Assessment: Report given to RN and Post -op Vital signs reviewed and stable  Post vital signs: Reviewed and stable  Last Vitals:  Vitals Value Taken Time  BP 124/64 09/27/19 1110  Temp    Pulse 73 09/27/19 1114  Resp 13 09/27/19 1114  SpO2 99 % 09/27/19 1114  Vitals shown include unvalidated device data.  Last Pain:  Vitals:   09/27/19 0941  TempSrc:   PainSc: 0-No pain      Patients Stated Pain Goal: 3 ([id number])  Complications: No apparent anesthesia complications

## 2019-09-27 NOTE — Anesthesia Procedure Notes (Signed)
Anesthesia Regional Block: Pectoralis block   Pre-Anesthetic Checklist: ,, timeout performed, Correct Patient, Correct Site, Correct Laterality, Correct Procedure, Correct Position, site marked, Risks and benefits discussed,  Surgical consent,  Pre-op evaluation,  At surgeon's request and post-op pain management  Laterality: Right  Prep: chloraprep       Needles:  Injection technique: Single-shot     Needle Length: 9cm  Needle Gauge: 21     Additional Needles:   Narrative:  Start time: 09/27/2019 9:35 AM End time: 09/27/2019 9:45 AM Injection made incrementally with aspirations every 5 mL.  Performed by: Personally  Anesthesiologist: Lillia Abed, MD  Additional Notes: Monitors applied. Patient sedated. Sterile prep and drape,hand hygiene and sterile gloves were used. Relevant anatomy identified.Needle position confirmed.Local anesthetic injected incrementally after negative aspiration. Local anesthetic spread visualized. Vascular puncture avoided. No complications. Image printed for medical record.The patient tolerated the procedure well.

## 2019-09-28 LAB — SURGICAL PATHOLOGY

## 2019-09-29 ENCOUNTER — Encounter: Payer: Self-pay | Admitting: *Deleted

## 2019-10-02 ENCOUNTER — Telehealth: Payer: Self-pay | Admitting: *Deleted

## 2019-10-02 NOTE — Telephone Encounter (Signed)
Received order for oncotype testing. Requisition faxed to pathology and [facility] °

## 2019-10-04 ENCOUNTER — Inpatient Hospital Stay: Payer: Medicare PPO | Attending: Hematology and Oncology | Admitting: Hematology and Oncology

## 2019-10-04 ENCOUNTER — Other Ambulatory Visit: Payer: Self-pay

## 2019-10-04 DIAGNOSIS — C50211 Malignant neoplasm of upper-inner quadrant of right female breast: Secondary | ICD-10-CM | POA: Diagnosis not present

## 2019-10-04 DIAGNOSIS — Z170 Estrogen receptor positive status [ER+]: Secondary | ICD-10-CM | POA: Diagnosis not present

## 2019-10-04 NOTE — Assessment & Plan Note (Signed)
09/07/2019:Screening mammogram detected a possible right breast mass. Diagnostic mammogram showed two adjacent and nearly contiguous masses at the 2 o'clock position, 1.7cm, no right axillary adenopathy. Biopsy showed invasive mammary carcinoma, grade 2, HER-2 equivocal by IHC, negative by FISH, ER+ 95%, PR+ 90%, Ki67 2%. T1CN0 stage Ia clinical stage  Right lumpectomy 09/27/2019: Grade 2 IDC 1.5 cm with intermediate grade DCIS, margins negative, 0/4 lymph nodes negative, ER 95%, PR 90%, HER-2 negative, Ki-67 2%  Pathology counseling: I discussed the final pathology report of the patient provided  a copy of this report. I discussed the margins as well as lymph node surgeries. We also discussed the final staging along with previously performed ER/PR and HER-2/neu testing.  Recommendation: 1. Oncotype DX to determine if chemotherapy would be of benefit 2.  Adjuvant radiation therapy 3.  Follow-up adjuvant antiestrogen therapy  Return to clinic based upon Oncotype test result.

## 2019-10-04 NOTE — Progress Notes (Signed)
Patient Care Team: [name], MD as PCP - General (Family Medicine) Orlena Sheldon, PA-C as Physician Assistant (Unknown Physician Specialty) [name], RN as Oncology Nurse Navigator [name], RN as Oncology Nurse Navigator [name], MD as Consulting Physician (Hematology and Oncology) [name], MD as Attending Physician (Radiation Oncology) [name], MD as Consulting Physician (General Surgery)  DIAGNOSIS:  Encounter Diagnosis  Name Primary?  . Malignant neoplasm of upper-inner quadrant of right breast in female, estrogen receptor positive ([place])     SUMMARY OF ONCOLOGIC HISTORY: Oncology History  Malignant neoplasm of upper-inner quadrant of right breast in female, estrogen receptor positive ([place])  09/07/2019 Initial Diagnosis   Screening mammogram detected a possible right breast mass. Diagnostic mammogram showed two adjacent and nearly contiguous masses at the 2 o'clock position, 1.7cm, no right axillary adenopathy. Biopsy showed invasive mammary carcinoma, grade 2, HER-2 equivocal by IHC, negative by FISH, ER+ 95%, PR+ 90%, Ki67 2%.   09/19/2019 Genetic Testing   Negative genetic testing:  No pathogenic variants detected on the [facility] Breast Cancer STAT panel, Common Hereditary Cancers panel, or Melanoma panel. A variant of uncertain significance (VUS) was detected in the POLE gene called c.1357C>G. The report date is 09/19/2019.  The STAT Breast cancer panel offered by [facility] includes sequencing and rearrangement analysis for the following 9 genes:  ATM, BRCA1, BRCA2, CDH1, CHEK2, PALB2, PTEN, STK11 and TP53. The Common Hereditary Cancers Panel offered by [facility] includes sequencing and/or deletion duplication testing of the following 48 genes: APC, ATM, AXIN2, BARD1, BMPR1A, BRCA1, BRCA2, BRIP1, CDH1, CDK4, CDKN2A (p14ARF), CDKN2A (p16INK4a), CHEK2, CTNNA1, DICER1, EPCAM (Deletion/duplication testing only), GREM1 (promoter region  deletion/duplication testing only), KIT, MEN1, MLH1, MSH2, MSH3, MSH6, MUTYH, NBN, NF1, NHTL1, PALB2, PDGFRA, PMS2, POLD1, POLE, PTEN, RAD50, RAD51C, RAD51D, RNF43, SDHB, SDHC, SDHD, SMAD4, SMARCA4. STK11, TP53, TSC1, TSC2, and VHL.  The following genes were evaluated for sequence changes only: SDHA and HOXB13 c.251G>A variant only. The Melanoma panel offered by [facility] includes sequencing and/or deletion duplication testing of the following 9 genes: BAP1, BRCA2, BRIP1, CDK4, CDKN2A (p14ARF), CDKN2A (p16INK4a), POT1, PTEN, RB1, and TP53.  The following gene was evaluated for sequence changes only: MITF (c.952G>A, p.Glu318Lys variant only).    09/27/2019 Surgery   Right lumpectomy 09/27/2019: Grade 2 IDC 1.5 cm with intermediate grade DCIS, margins negative, 0/4 lymph nodes negative, ER 95%, PR 90%, HER-2 negative, Ki-67 2%     CHIEF COMPLIANT: Follow-up after surgery to discuss pathology report  INTERVAL HISTORY: Catherine Long is a 69 year old with above-mentioned history of right breast cancer who underwent surgery and is here today to discuss the pathology report.  She had tolerated surgery extremely well without any pain or discomfort.  She is here accompanied by her daughter.   ALLERGIES:  has No Known Allergies.  MEDICATIONS:  Current Outpatient Medications  Medication Sig Dispense Refill  . Ascorbic Acid (VITAMIN C) 1000 MG tablet Take 1,000 mg by mouth daily.     . B Complex-C (SUPER B COMPLEX) TABS Take 1 tablet by mouth daily.    . Calcium Carbonate-Vitamin D3 (CALCIUM 600/VITAMIN D) 600-400 MG-UNIT TABS Take 2 tablets by mouth daily.    . Cholecalciferol (VITAMIN D) 50 MCG (2000 UT) tablet Take 2,000 Units by mouth daily.     . Hypromellose (SYSTANE OVERNIGHT THERAPY OP) Place 1 drop into both eyes at bedtime.    . Multiple Vitamins-Minerals (MULTI COMPLETE/IRON PO) Take 1 tablet by mouth daily.    Marland Kitchen  traMADol (ULTRAM) 50 MG tablet Take 1 tablet (50 mg total) by mouth every 6  (six) hours as needed. 20 tablet 0  . Turmeric 500 MG CAPS Take 500 mg by mouth daily.      No current facility-administered medications for this visit.    PHYSICAL EXAMINATION: ECOG PERFORMANCE STATUS: 1 - Symptomatic but completely ambulatory  Vitals:   10/04/19 0859  BP: (!) 159/74  Pulse: 96  Resp: 18  Temp: 98 F (36.7 C)  SpO2: 96%   Filed Weights   10/04/19 0859  Weight: 152 lb (68.9 kg)       LABORATORY DATA:  I have reviewed the data as listed CMP Latest Ref Rng & Units 09/13/2019 05/19/2019 05/17/2018  Glucose 70 - 99 mg/dL 101(H) 94 86  BUN 8 - 23 mg/dL '11 13 9  ' Creatinine 0.44 - 1.00 mg/dL 0.82 0.77 0.75  Sodium 135 - 145 mmol/L 142 139 140  Potassium 3.5 - 5.1 mmol/L 4.3 5.2 4.8  Chloride 98 - 111 mmol/L 104 101 103  CO2 22 - 32 mmol/L '26 26 29  ' Calcium 8.9 - 10.3 mg/dL 9.4 9.7 9.6  Total Protein 6.5 - 8.1 g/dL 7.8 6.6 7.0  Total Bilirubin 0.3 - 1.2 mg/dL 0.4 0.5 0.5  Alkaline Phos 38 - 126 U/L 62 - -  AST 15 - 41 U/L '19 18 18  ' ALT 0 - 44 U/L '18 12 17    ' Lab Results  Component Value Date   WBC 8.8 09/25/2019   HGB 13.8 09/25/2019   HCT 43.6 09/25/2019   MCV 91.4 09/25/2019   PLT 315 09/25/2019   NEUTROABS 8.9 (H) 09/13/2019    ASSESSMENT & PLAN:  Malignant neoplasm of upper-inner quadrant of right breast in female, estrogen receptor positive ([place]) 09/07/2019:Screening mammogram detected a possible right breast mass. Diagnostic mammogram showed two adjacent and nearly contiguous masses at the 2 o'clock position, 1.7cm, no right axillary adenopathy. Biopsy showed invasive mammary carcinoma, grade 2, HER-2 equivocal by IHC, negative by FISH, ER+ 95%, PR+ 90%, Ki67 2%. T1CN0 stage Ia clinical stage  Right lumpectomy 09/27/2019: Grade 2 IDC 1.5 cm with intermediate grade DCIS, margins negative, 0/4 lymph nodes negative, ER 95%, PR 90%, HER-2 negative, Ki-67 2%  Pathology counseling: I discussed the final pathology report of the patient provided  a copy  of this report. I discussed the margins as well as lymph node surgeries. We also discussed the final staging along with previously performed ER/PR and HER-2/neu testing.  Recommendation: 1. Oncotype DX to determine if chemotherapy would be of benefit 2.  Adjuvant radiation therapy 3.  Follow-up adjuvant antiestrogen therapy  Return to clinic based upon Oncotype test result.    No orders of the defined types were placed in this encounter.  The patient has a good understanding of the overall plan. she agrees with it. she will call with any problems that may develop before the next visit here. Total time spent: 30 mins including face to face time and time spent for planning, charting and co-ordination of care   Harriette Ohara, MD 10/04/19

## 2019-10-05 ENCOUNTER — Ambulatory Visit: Payer: Medicare PPO | Attending: Internal Medicine

## 2019-10-05 DIAGNOSIS — Z23 Encounter for immunization: Secondary | ICD-10-CM | POA: Insufficient documentation

## 2019-10-05 NOTE — Progress Notes (Signed)
   Covid-19 Vaccination Clinic  Name:  Desiray Kruizenga    MRN: [id number] DOB: July 24, 1951  10/05/2019  Ms. Maute was observed post Covid-19 immunization for 15 minutes without incident. She was provided with Vaccine Information Sheet and instruction to access the V-Safe system.   Ms. Salber was instructed to call 911 with any severe reactions post vaccine: Marland Kitchen Difficulty breathing  . Swelling of face and throat  . A fast heartbeat  . A bad rash all over body  . Dizziness and weakness   Immunizations Administered    Name Date Dose VIS Date Route   Pfizer COVID-19 Vaccine 10/05/2019  8:51 AM 0.3 mL 07/14/2019 Intramuscular   Manufacturer: [place]   Lot: [id number]   [place]: [id number]

## 2019-10-11 DIAGNOSIS — Z170 Estrogen receptor positive status [ER+]: Secondary | ICD-10-CM | POA: Diagnosis not present

## 2019-10-11 DIAGNOSIS — C50211 Malignant neoplasm of upper-inner quadrant of right female breast: Secondary | ICD-10-CM | POA: Diagnosis not present

## 2019-10-13 ENCOUNTER — Encounter (HOSPITAL_COMMUNITY): Payer: Self-pay | Admitting: Hematology and Oncology

## 2019-10-16 ENCOUNTER — Telehealth: Payer: Self-pay | Admitting: *Deleted

## 2019-10-16 DIAGNOSIS — C50211 Malignant neoplasm of upper-inner quadrant of right female breast: Secondary | ICD-10-CM

## 2019-10-16 DIAGNOSIS — Z170 Estrogen receptor positive status [ER+]: Secondary | ICD-10-CM

## 2019-10-16 NOTE — Telephone Encounter (Signed)
Received oncotype score of 9/3%. Physician team notified. Called pt with results and discussed chemotherapy is not recommended. Received verbal understanding. Informed pt next step is xrt with Dr. Isidore Moos and that she will receive a call with her appt date and time from her office. Encourage pt to call with needs.

## 2019-10-18 ENCOUNTER — Encounter: Payer: Self-pay | Admitting: *Deleted

## 2019-10-18 NOTE — Progress Notes (Signed)
Location of Breast Cancer: Right Breast  Histology per Pathology Report:  09/01/19 Diagnosis Breast, right, needle core biopsy, 2 o'clock position, 2cmfn - INVASIVE MAMMARY CARCINOMA  Receptor Status: ER(95%), PR (90%), Her2-neu (NEG), Ki-(2%)  09/27/19 FINAL MICROSCOPIC DIAGNOSIS: A. BREAST, RIGHT, LUMPECTOMY: - Invasive ductal carcinoma, 1.5 cm - Ductal carcinoma in situ, intermediate grade - Carcinoma is less than 1 mm from the posterior margin and 6 mm from anterior margin - No evidence of lymphovascular invasion - Biopsy site changes - See oncology table B. LYMPH NODE, RIGHT AXILLARY, SENTINEL, BIOPSY: - Lymph node, negative for carcinoma (0/1) C. LYMPH NODE, RIGHT AXILLARY, SENTINEL, BIOPSY: - Lymph node, negative for carcinoma (0/1) D. LYMPH NODE, RIGHT AXILLARY, SENTINEL, BIOPSY: - Lymph node, negative for carcinoma (0/1) E. LYMPH NODE, RIGHT AXILLARY, SENTINEL, BIOPSY: - Lymph node, negative for carcinoma (0/1)  Did patient present with symptoms or was this found on screening mammography?: It was found on a screening mammogram.   Past/Anticipated interventions by surgeon, if any: 09/27/19 Procedure(s): RADIOACTIVE SEED GUIDED RIGHT BREAST PARTIAL MASTECTOMY WITH DEEP RIGHT AXILLARY SENTINEL LYMPH NODE BIOPSY Surgeon(s): Blackman, Douglas, MD Gosai, Puja, PA-C  Past/Anticipated interventions by medical oncology, if any:  10/04/19 Dr. Gudena Recommendation: 1. Oncotype DX to determine if chemotherapy would be of benefit 2.Adjuvant radiation therapy 3.Follow-up adjuvant antiestrogen therapy  Return to clinic based upon Oncotype test result.  10/16/19- Dawn Stuart- Breast Navigator Received oncotype score of 9/3%. Physician team notified. Called pt with results and discussed chemotherapy is not recommended. Received verbal understanding. Informed pt next step is xrt with Dr. Squire and that she will receive a call with her appt date and time from her office.  Encourage pt to call with needs  Lymphedema issues, if any:  She denies. She has good arm mobility.   Pain issues, if any:  She denies.   SAFETY ISSUES:  Prior radiation? No  Pacemaker/ICD? No  Possible current pregnancy? No  Is the patient on methotrexate? No  Current Complaints / other details:      Malmfelt, Jennifer L, RN 10/18/2019,9:45 AM   

## 2019-10-19 ENCOUNTER — Other Ambulatory Visit: Payer: Self-pay

## 2019-10-19 ENCOUNTER — Encounter: Payer: Self-pay | Admitting: Physical Therapy

## 2019-10-19 ENCOUNTER — Ambulatory Visit: Payer: Medicare PPO | Attending: Hematology and Oncology | Admitting: Physical Therapy

## 2019-10-19 DIAGNOSIS — R293 Abnormal posture: Secondary | ICD-10-CM | POA: Diagnosis not present

## 2019-10-19 DIAGNOSIS — Z483 Aftercare following surgery for neoplasm: Secondary | ICD-10-CM | POA: Diagnosis not present

## 2019-10-19 DIAGNOSIS — C50211 Malignant neoplasm of upper-inner quadrant of right female breast: Secondary | ICD-10-CM

## 2019-10-19 DIAGNOSIS — Z170 Estrogen receptor positive status [ER+]: Secondary | ICD-10-CM | POA: Insufficient documentation

## 2019-10-19 NOTE — Patient Instructions (Addendum)
Closed Chain: Shoulder Flexion / Extension - on Wall    Hands on wall, step backward. Return. Stepping causes shoulder flexion and extension Do _3__ times, holding 5 seconds, _2__ times per day. http://ss.exer.us/265   Copyright  VHI. All rights reserved.  Closed Chain: Shoulder Abduction / Adduction - on Wall    One hand on wall, step to side and return. Stepping causes shoulder to abduct and adduct. Do _3__ times, holding 5 seconds, _2__ times per day. http://ss.exer.us/267   Copyright  VHI. All rights reserved.               [facility]         [address], [place] [address]         [phone]         Annia Friendly, PT, CLT   After Breast Cancer Class  I recommend you attend the class on 11/20/2019 at 11:00 virtually (see flyer).   Scar massage  Begin scar massage on both incision sites using coconut oil a few times per day.   L-Dex testing  I recommend you come every 3 months to be retested on the L-Dex machine to ensure you are not developing lymphedema.   Home exercise Program  I recommend you continue doing your home exercises during radiation so you don't lose any range of motion   Follow up PT 3 month follow ups for L-Dex testing

## 2019-10-19 NOTE — Therapy (Signed)
[place], [place], [address] Phone: [phone]   Fax:  [phone]  Physical Therapy Treatment  Patient Details  Name: Catherine Long MRN: [id number] Date of Birth: March 04, 1951 Referring Provider (PT): Dr. [name]   Encounter Date: 10/19/2019  PT End of Session - 10/19/19 1228    Visit Number  2    Number of Visits  2    PT Start Time  0263    PT Stop Time  1150    PT Time Calculation (min)  57 min    Activity Tolerance  Patient tolerated treatment well    Behavior During Therapy  [facility] for tasks assessed/performed       Past Medical History:  Diagnosis Date  . Asthma    several years ago  . Family history of breast cancer   . Family history of melanoma   . Family history of ovarian cancer   . History of melanoma   . Melanoma ([place])   . PONV (postoperative nausea and vomiting)   . Right middle lobe syndrome     Past Surgical History:  Procedure Laterality Date  . RADIOACTIVE SEED GUIDED PARTIAL MASTECTOMY WITH AXILLARY SENTINEL LYMPH NODE BIOPSY Right 09/27/2019   Procedure: RIGHT RADIOACTIVE SEED GUIDED LUMPECTOMY WITH  SENTINEL LYMPH NODE BIOPSY;  Surgeon: [name], MD;  Location: [place];  Service: General;  Laterality: Right;  LMA  . TONSILLECTOMY    . TOTAL HIP ARTHROPLASTY  2003 and 2006   Bilateral    There were no vitals filed for this visit.  Subjective Assessment - 10/19/19 1056    Subjective  Patient underwent a right lumpectomy and sentinel node biopsy (4 negative nodes removed) on 09/27/2019. Her Oncotype score was 9 so no chemo needed. She will begin radiation following simulation which is scheduled for 10/23/2019.    Pertinent History  Patient was diagnosed on 08/14/2019 with right grade II invasive ductal carcinoma breast cancer. Patient underwent a right lumpectomy and sentinel node biopsy (4 negative nodes removed) on 09/27/2019. It is ER/PR positive and  HER2 negative with a Ki67 of 2%. She has had bilateral hip replacements, right in 2003 and left in 2006. She also has a history of right arm melanoma in 1975.    Patient Stated Goals  Make sure my arm is ok    Currently in Pain?  No/denies         [facility] PT Assessment - 10/19/19 0001      Assessment   Medical Diagnosis  s/p right lumpectomy and SLNB    Referring Provider (PT)  Dr. [name]    Onset Date/Surgical Date  09/27/19    Hand Dominance  Right    Prior Therapy  Baselines      Precautions   Precautions  Other (comment)    Precaution Comments  recent surgery and right arm lymphedema risk      Restrictions   Weight Bearing Restrictions  No      Balance Screen   Has the patient fallen in the past 6 months  No    Has the patient had a decrease in activity level because of a fear of falling?   No    Is the patient reluctant to leave their home because of a fear of falling?   No      Home Social worker  Private residence    Living Arrangements  Alone    Available Help at  Discharge  Family      Prior Function   Level of Independence  Independent    Vocation  Retired    Biomedical scientist  Retired Copywriter, advertising    Leisure  She exercises 4-5x/week using Zoom calls with silver sneakers; 2x/week she does cardio for 45 minutes      Cognition   Overall Cognitive Status  Within Functional Limits for tasks assessed      Observation/Other Assessments   Observations  Breast and axillary incisions both appear to be well healed with no signs of redness or edema. L-Dex score 2.1      Posture/Postural Control   Posture/Postural Control  Postural limitations    Postural Limitations  Rounded Shoulders;Forward head      ROM / Strength   AROM / PROM / Strength  AROM      AROM   AROM Assessment Site  Shoulder    Right/Left Shoulder  Right    Right Shoulder Extension  50 Degrees    Right Shoulder Flexion  147 Degrees    Right Shoulder ABduction  159  Degrees    Right Shoulder Internal Rotation  67 Degrees    Right Shoulder External Rotation  83 Degrees      Strength   Overall Strength  Within functional limits for tasks performed        LYMPHEDEMA/ONCOLOGY QUESTIONNAIRE - 10/19/19 1107      Type   Cancer Type  Right breast cancer      Surgeries   Lumpectomy Date  09/27/19    Sentinel Lymph Node Biopsy Date  09/27/19    Number Lymph Nodes Removed  4      Treatment   Active Chemotherapy Treatment  No    Past Chemotherapy Treatment  No    Active Radiation Treatment  No    Past Radiation Treatment  No    Current Hormone Treatment  No    Past Hormone Therapy  No      What other symptoms do you have   Are you Having Heaviness or Tightness  No    Are you having Pain  No    Are you having pitting edema  No    Is it Hard or Difficult finding clothes that fit  No    Do you have infections  No    Is there Decreased scar mobility  No    Stemmer Sign  No      Lymphedema Assessments   Lymphedema Assessments  Upper extremities      Right Upper Extremity Lymphedema   10 cm Proximal to Olecranon Process  28 cm    Olecranon Process  23.8 cm    10 cm Proximal to Ulnar Styloid Process  19.3 cm    Just Proximal to Ulnar Styloid Process  14 cm    Across Hand at [employer]  17.7 cm    At [place] of 2nd Digit  5.7 cm      Left Upper Extremity Lymphedema   10 cm Proximal to Olecranon Process  28.8 cm    Olecranon Process  23 cm    10 cm Proximal to Ulnar Styloid Process  19.4 cm    Just Proximal to Ulnar Styloid Process  13.2 cm    Across Hand at [employer]  17.2 cm    At [place] of 2nd Digit  5.3 cm        Quick Dash - 10/19/19 0001    Open a  tight or new jar  No difficulty    Do heavy household chores (wash walls, wash floors)  No difficulty    Carry a shopping bag or briefcase  No difficulty    Wash your back  No difficulty    Use a knife to cut food  No difficulty    Recreational activities in which you take some  force or impact through your arm, shoulder, or hand (golf, hammering, tennis)  No difficulty    During the past week, to what extent has your arm, shoulder or hand problem interfered with your normal social activities with family, friends, neighbors, or groups?  Not at all    During the past week, to what extent has your arm, shoulder or hand problem limited your work or other regular daily activities  Not at all    Arm, shoulder, or hand pain.  None    Tingling (pins and needles) in your arm, shoulder, or hand  None    Difficulty Sleeping  No difficulty    DASH Score  0 %                     PT Education - 10/19/19 1217    Education Details  Scar massage; importance of ABC class; closed chain flexion and abduction exercises    Person(s) Educated  Patient    Methods  Explanation;Demonstration;Handout    Comprehension  Returned demonstration;Verbalized understanding          PT Long Term Goals - 10/19/19 1249      PT LONG TERM GOAL #1   Title  Patient will demonstrate she has regained full shoulder ROM and function post operatively compared to baselines.    Time  8    Period  Weeks    Status  Achieved            Plan - 10/19/19 1230    Clinical Impression Statement  Patient is doing very well s/p her right lumpectomy and sentinel node biopsy on 09/27/2019. She has regained full shoulder ROM, shows no sign of lymphedema, and her incisions appear to be healing very well. L-Dex testing was performed today and her baseline was within normal range. She plans to attend the After Breast Cancer Class on 11/20/2019 to learn about lymphedema. Otherwise, she has no PT nedes at this time.    PT Treatment/Interventions  ADLs/Self Care Home Management;Therapeutic exercise;Patient/family education    PT Next Visit Plan  D/C    PT Home Exercise Plan  Post op shoulder ROM HEP    Consulted and Agree with Plan of Care  Patient       Patient will benefit from skilled therapeutic  intervention in order to improve the following deficits and impairments:  Postural dysfunction, Decreased range of motion, Pain, Impaired UE functional use, Decreased knowledge of precautions, Decreased scar mobility  Visit Diagnosis: Malignant neoplasm of upper-inner quadrant of right breast in female, estrogen receptor positive ([facility])  Abnormal posture  Aftercare following surgery for neoplasm  The patient was assessed using the L-Dex machine today to produce a lymphedema index baseline score. The patient will be reassessed on a regular basis (typically every 3 months) to obtain new L-Dex scores. If the score is > 6.5 points away from his/her baseline score indicating onset of subclinical lymphedema, it will be recommended to wear a compression garment for 4 weeks, 12 hours per day and then be reassessed. If the score continues to be > 6.5 points from  baseline at reassessment, we will initiate lymphedema treatment. Assessing in this manner has a 95% rate of preventing clinically significant lymphedema.    Problem List Patient Active Problem List   Diagnosis Date Noted  . Genetic testing 09/20/2019  . History of melanoma   . Family history of breast cancer   . Family history of ovarian cancer   . Family history of melanoma   . Malignant neoplasm of upper-inner quadrant of right breast in female, estrogen receptor positive ([place]) 09/07/2019  . Osteopenia 05/11/2017  . Asthma, chronic, resolved 04/26/2012  . Right middle lobe syndrome 04/26/2012   PHYSICAL THERAPY DISCHARGE SUMMARY  Visits from Start of Care: 2  Current functional level related to goals / functional outcomes: Goals met. See above for objective findings.   Remaining deficits: None   Education / Equipment: Lymphedema risk education and HEP Plan: Patient agrees to discharge.  Patient goals were met. Patient is being discharged due to meeting the stated rehab goals.  ?????         Annia Friendly,  [place] 10/19/19 12:50 PM   [place], [place], [address] Phone: [phone]   Fax:  [phone]  Name: Brisa Auth MRN: [id number] Date of Birth: 1951-03-26

## 2019-10-23 ENCOUNTER — Ambulatory Visit
Admission: RE | Admit: 2019-10-23 | Discharge: 2019-10-23 | Disposition: A | Discharge status: Home / Self Care | Payer: Medicare PPO | Source: Ambulatory Visit | Attending: Radiation Oncology | Admitting: Radiation Oncology

## 2019-10-23 ENCOUNTER — Ambulatory Visit: Payer: Medicare PPO | Admitting: Radiation Oncology

## 2019-10-23 ENCOUNTER — Encounter: Payer: Self-pay | Admitting: Radiation Oncology

## 2019-10-23 ENCOUNTER — Other Ambulatory Visit: Payer: Self-pay

## 2019-10-23 DIAGNOSIS — C50211 Malignant neoplasm of upper-inner quadrant of right female breast: Secondary | ICD-10-CM | POA: Diagnosis not present

## 2019-10-23 DIAGNOSIS — Z170 Estrogen receptor positive status [ER+]: Secondary | ICD-10-CM

## 2019-10-23 DIAGNOSIS — Z9889 Other specified postprocedural states: Secondary | ICD-10-CM | POA: Diagnosis not present

## 2019-10-24 ENCOUNTER — Encounter: Payer: Self-pay | Admitting: Radiation Oncology

## 2019-10-24 NOTE — Progress Notes (Signed)
Radiation Oncology         [phone] ________________________________  Name: Catherine Long MRN: [id number]  Date: 10/23/2019  DOB: 1951/03/04  Follow-Up Visit Note: the patient opted for telemedicine to maximize safety during the pandemic.  MyChart video was used.  Outpatient  CC: [name], MD  [name], MD  Diagnosis:      ICD-10-CM   1. Malignant neoplasm of upper-inner quadrant of right breast in female, estrogen receptor positive ([place])  C50.211    Z17.0    Cancer Staging Malignant neoplasm of upper-inner quadrant of right breast in female, estrogen receptor positive ([place]) Staging form: Breast, AJCC 8th Edition - Clinical stage from 09/13/2019: Stage IA (cT1c, cN0, cM0, G2, ER+, PR+, HER2-) - Unsigned - Pathologic stage from 10/04/2019: Stage IA (pT1c, pN0, cM0, G2, ER+, PR+, HER2-) - Signed by [name], MD on 10/04/2019    CHIEF COMPLAINT: Here to discuss management of right breast cancer  Narrative: I spoke with the patient on MyChart video today.  She is feeling well.  On 09/27/2019 she underwent right breast lumpectomy which revealed a 1.5 cm tumor.  The histology was grade 1 invasive ductal carcinoma.  There was intermediate grade DCIS in the specimen as well.  4 sentinel lymph nodes were all negative.  Her margins were close but negative (less than 1 mm).  Her Oncotype score was low.  She will not be receiving chemotherapy.  She has plans to leave town for family vacation and therefore request that her radiation therapy not start until early April when she returns.  Lymphedema issues, if any:  She denies. She has good arm mobility.   Pain issues, if any:  She denies.         ALLERGIES:  has No Known Allergies.  Meds: Current Outpatient Medications  Medication Sig Dispense Refill  . Ascorbic Acid (VITAMIN C) 1000 MG tablet Take 1,000 mg by mouth daily.     . B Complex-C (SUPER B COMPLEX) TABS Take 1 tablet by mouth daily.    . Calcium  Carbonate-Vitamin D3 (CALCIUM 600/VITAMIN D) 600-400 MG-UNIT TABS Take 2 tablets by mouth daily.    . Cholecalciferol (VITAMIN D) 50 MCG (2000 UT) tablet Take 2,000 Units by mouth daily.     . Hypromellose (SYSTANE OVERNIGHT THERAPY OP) Place 1 drop into both eyes at bedtime.    . Multiple Vitamins-Minerals (MULTI COMPLETE/IRON PO) Take 1 tablet by mouth daily.    . Turmeric 500 MG CAPS Take 500 mg by mouth daily.     . traMADol (ULTRAM) 50 MG tablet Take 1 tablet (50 mg total) by mouth every 6 (six) hours as needed. (Patient not taking: Reported on 10/23/2019) 20 tablet 0   No current facility-administered medications for this encounter.    Physical Findings:  vitals were not taken for this visit. .     General: Alert and oriented, in no acute distress Psychiatric: Judgment and insight are intact. Affect is appropriate.    Lab Findings: Lab Results  Component Value Date   WBC 8.8 09/25/2019   HGB 13.8 09/25/2019   HCT 43.6 09/25/2019   MCV 91.4 09/25/2019   PLT 315 09/25/2019      Radiographic Findings: NM Sentinel Node Inj-No Rpt (Breast)  Result Date: 09/27/2019 Sulfur colloid was injected by the nuclear medicine technologist for melanoma sentinel node.   MM Breast Surgical Specimen  Result Date: 09/27/2019 CLINICAL DATA:  Evaluate specimen EXAM: SPECIMEN RADIOGRAPH OF THE RIGHT BREAST  COMPARISON:  Previous exam(s). FINDINGS: Status post excision of the right breast. The radioactive seed and biopsy marker clip are present, completely intact, and were marked for pathology. IMPRESSION: Specimen radiograph of the right breast. Electronically Signed   By: Dorise Bullion III M.D   On: 09/27/2019 10:34   MM RT RADIOACTIVE SEED LOC MAMMO GUIDE  Result Date: 09/26/2019 CLINICAL DATA:  Patient presents for seed localization prior to lumpectomy of the RIGHT breast for grade 2 invasive mammary carcinoma 2 o'clock location. EXAM: MAMMOGRAPHIC GUIDED RADIOACTIVE SEED LOCALIZATION OF THE  RIGHT BREAST COMPARISON:  09/01/2019 and earlier FINDINGS: Patient presents for radioactive seed localization prior to lumpectomy. I met with the patient and we discussed the procedure of seed localization including benefits and alternatives. We discussed the high likelihood of a successful procedure. We discussed the risks of the procedure including infection, bleeding, tissue injury and further surgery. We discussed the low dose of radioactivity involved in the procedure. Informed, written consent was given. The usual time-out protocol was performed immediately prior to the procedure. Using mammographic guidance, sterile technique, 1% lidocaine and an I-125 radioactive seed, the ribbon shaped clip in the MEDIAL portion of the RIGHT breast was localized using a MEDIAL to LATERAL approach. The follow-up mammogram images confirm the seed in the expected location and were marked for Dr. Ninfa Linden. Follow-up survey of the patient confirms presence of the radioactive seed. Order number of I-125 seed:  [id number]. Total activity:  9.798 millicuries reference Date: 08/22/2019 The patient tolerated the procedure well and was released from the [facility]. She was given instructions regarding seed removal. IMPRESSION: Radioactive seed localization right breast. No apparent complications. Electronically Signed   By: Nolon Nations M.D.   On: 09/26/2019 13:36    Impression/Plan: This is a lovely patient with a history of right breast cancer, status post breast conserving surgery.  We discussed adjuvant radiotherapy today.  I recommend radiotherapy to the right breast over 4 weeks in order to reduce risk of local regional recurrence by two thirds.  The risks, benefits and side effects of this treatment were discussed in detail.  She understands that radiotherapy is associated with skin irritation and fatigue in the acute setting. Late effects can include cosmetic changes and rare injury to internal organs.   She is  enthusiastic about proceeding with treatment.   A total of 3 medically necessary complex treatment devices will be fabricated and supervised by me: 2 fields with MLCs for custom blocks to protect heart, and lungs;  and, a Vac-lok. MORE COMPLEX DEVICES MAY BE MADE IN DOSIMETRY FOR FIELD IN FIELD BEAMS FOR DOSE HOMOGENEITY.  I have requested : 3D Simulation which is medically necessary to give adequate dose to at risk tissues while sparing lungs and heart.  I have requested a DVH of the following structures: lungs, heart, right lumpectomy cavity.    The patient will receive 40.05 Gy in 15 fractions to the right breast with 2 fields.  This will be followed by a boost.  This encounter was provided by telemedicine platform MyChart video The patient has given verbal consent for this type of encounter and has been advised to only accept a meeting of this type in a secure network environment. On date of service, in total, I spent 30 minutes on this encounter. The attendants for this meeting include [name]  and [employer].  During the encounter, [name] was located at [facility] Radiation Oncology Department.  Ruthann Callagy  Mcclaran was located at home.    _____________________________________   [name], MD

## 2019-10-30 ENCOUNTER — Telehealth: Payer: Self-pay | Admitting: Hematology and Oncology

## 2019-10-30 ENCOUNTER — Other Ambulatory Visit: Payer: Self-pay

## 2019-10-30 ENCOUNTER — Ambulatory Visit
Admission: RE | Admit: 2019-10-30 | Discharge: 2019-10-30 | Disposition: A | Discharge status: Home / Self Care | Payer: Medicare PPO | Source: Ambulatory Visit | Attending: Radiation Oncology | Admitting: Radiation Oncology

## 2019-10-30 ENCOUNTER — Encounter: Payer: Self-pay | Admitting: *Deleted

## 2019-10-30 DIAGNOSIS — C50211 Malignant neoplasm of upper-inner quadrant of right female breast: Secondary | ICD-10-CM | POA: Insufficient documentation

## 2019-10-30 DIAGNOSIS — Z170 Estrogen receptor positive status [ER+]: Secondary | ICD-10-CM | POA: Insufficient documentation

## 2019-10-30 NOTE — Telephone Encounter (Signed)
Scheduled appt per 3/29 sch msg. Pt confirmed new appt date and time.

## 2019-10-31 DIAGNOSIS — Z170 Estrogen receptor positive status [ER+]: Secondary | ICD-10-CM | POA: Diagnosis not present

## 2019-10-31 DIAGNOSIS — C50211 Malignant neoplasm of upper-inner quadrant of right female breast: Secondary | ICD-10-CM | POA: Diagnosis not present

## 2019-11-07 ENCOUNTER — Ambulatory Visit
Admission: RE | Admit: 2019-11-07 | Discharge: 2019-11-07 | Disposition: A | Discharge status: Home / Self Care | Payer: Medicare PPO | Source: Ambulatory Visit | Attending: Radiation Oncology | Admitting: Radiation Oncology

## 2019-11-07 ENCOUNTER — Other Ambulatory Visit: Payer: Self-pay

## 2019-11-07 DIAGNOSIS — Z170 Estrogen receptor positive status [ER+]: Secondary | ICD-10-CM | POA: Insufficient documentation

## 2019-11-07 DIAGNOSIS — C50211 Malignant neoplasm of upper-inner quadrant of right female breast: Secondary | ICD-10-CM | POA: Insufficient documentation

## 2019-11-07 MED ORDER — SONAFINE EX EMUL
1.0000 "application " | Freq: Two times a day (BID) | CUTANEOUS | Status: DC
  Administered 2019-11-07: 1 via TOPICAL

## 2019-11-07 MED ORDER — ALRA NON-METALLIC DEODORANT (RAD-ONC)
1.0000 "application " | Freq: Once | TOPICAL | Status: AC
  Administered 2019-11-07: 1 via TOPICAL

## 2019-11-07 NOTE — Progress Notes (Signed)

## 2019-11-08 ENCOUNTER — Other Ambulatory Visit: Payer: Self-pay

## 2019-11-08 ENCOUNTER — Ambulatory Visit
Admission: RE | Admit: 2019-11-08 | Discharge: 2019-11-08 | Disposition: A | Discharge status: Home / Self Care | Payer: Medicare PPO | Source: Ambulatory Visit | Attending: Radiation Oncology | Admitting: Radiation Oncology

## 2019-11-08 DIAGNOSIS — Z170 Estrogen receptor positive status [ER+]: Secondary | ICD-10-CM | POA: Diagnosis not present

## 2019-11-08 DIAGNOSIS — C50211 Malignant neoplasm of upper-inner quadrant of right female breast: Secondary | ICD-10-CM | POA: Diagnosis not present

## 2019-11-09 ENCOUNTER — Other Ambulatory Visit: Payer: Self-pay

## 2019-11-09 ENCOUNTER — Ambulatory Visit
Admission: RE | Admit: 2019-11-09 | Discharge: 2019-11-09 | Disposition: A | Discharge status: Home / Self Care | Payer: Medicare PPO | Source: Ambulatory Visit | Attending: Radiation Oncology | Admitting: Radiation Oncology

## 2019-11-09 DIAGNOSIS — Z170 Estrogen receptor positive status [ER+]: Secondary | ICD-10-CM | POA: Diagnosis not present

## 2019-11-09 DIAGNOSIS — C50211 Malignant neoplasm of upper-inner quadrant of right female breast: Secondary | ICD-10-CM | POA: Diagnosis not present

## 2019-11-10 ENCOUNTER — Ambulatory Visit
Admission: RE | Admit: 2019-11-10 | Discharge: 2019-11-10 | Disposition: A | Discharge status: Home / Self Care | Payer: Medicare PPO | Source: Ambulatory Visit | Attending: Radiation Oncology | Admitting: Radiation Oncology

## 2019-11-10 ENCOUNTER — Other Ambulatory Visit: Payer: Self-pay

## 2019-11-10 DIAGNOSIS — Z170 Estrogen receptor positive status [ER+]: Secondary | ICD-10-CM | POA: Diagnosis not present

## 2019-11-10 DIAGNOSIS — C50211 Malignant neoplasm of upper-inner quadrant of right female breast: Secondary | ICD-10-CM | POA: Diagnosis not present

## 2019-11-13 ENCOUNTER — Ambulatory Visit
Admission: RE | Admit: 2019-11-13 | Discharge: 2019-11-13 | Disposition: A | Discharge status: Home / Self Care | Payer: Medicare PPO | Source: Ambulatory Visit | Attending: Radiation Oncology | Admitting: Radiation Oncology

## 2019-11-13 ENCOUNTER — Other Ambulatory Visit: Payer: Self-pay

## 2019-11-13 DIAGNOSIS — Z170 Estrogen receptor positive status [ER+]: Secondary | ICD-10-CM | POA: Diagnosis not present

## 2019-11-13 DIAGNOSIS — C50211 Malignant neoplasm of upper-inner quadrant of right female breast: Secondary | ICD-10-CM | POA: Diagnosis not present

## 2019-11-14 ENCOUNTER — Ambulatory Visit
Admission: RE | Admit: 2019-11-14 | Discharge: 2019-11-14 | Disposition: A | Discharge status: Home / Self Care | Payer: Medicare PPO | Source: Ambulatory Visit | Attending: Radiation Oncology | Admitting: Radiation Oncology

## 2019-11-14 ENCOUNTER — Other Ambulatory Visit: Payer: Self-pay

## 2019-11-14 DIAGNOSIS — C50211 Malignant neoplasm of upper-inner quadrant of right female breast: Secondary | ICD-10-CM | POA: Diagnosis not present

## 2019-11-14 DIAGNOSIS — Z170 Estrogen receptor positive status [ER+]: Secondary | ICD-10-CM | POA: Diagnosis not present

## 2019-11-15 ENCOUNTER — Ambulatory Visit
Admission: RE | Admit: 2019-11-15 | Discharge: 2019-11-15 | Disposition: A | Discharge status: Home / Self Care | Payer: Medicare PPO | Source: Ambulatory Visit | Attending: Radiation Oncology | Admitting: Radiation Oncology

## 2019-11-15 ENCOUNTER — Other Ambulatory Visit: Payer: Self-pay

## 2019-11-15 DIAGNOSIS — Z170 Estrogen receptor positive status [ER+]: Secondary | ICD-10-CM | POA: Diagnosis not present

## 2019-11-15 DIAGNOSIS — C50211 Malignant neoplasm of upper-inner quadrant of right female breast: Secondary | ICD-10-CM | POA: Diagnosis not present

## 2019-11-16 ENCOUNTER — Other Ambulatory Visit: Payer: Self-pay

## 2019-11-16 ENCOUNTER — Ambulatory Visit
Admission: RE | Admit: 2019-11-16 | Discharge: 2019-11-16 | Disposition: A | Discharge status: Home / Self Care | Payer: Medicare PPO | Source: Ambulatory Visit | Attending: Radiation Oncology | Admitting: Radiation Oncology

## 2019-11-16 DIAGNOSIS — C50211 Malignant neoplasm of upper-inner quadrant of right female breast: Secondary | ICD-10-CM | POA: Diagnosis not present

## 2019-11-16 DIAGNOSIS — Z170 Estrogen receptor positive status [ER+]: Secondary | ICD-10-CM | POA: Diagnosis not present

## 2019-11-17 ENCOUNTER — Ambulatory Visit
Admission: RE | Admit: 2019-11-17 | Discharge: 2019-11-17 | Disposition: A | Discharge status: Home / Self Care | Payer: Medicare PPO | Source: Ambulatory Visit | Attending: Radiation Oncology | Admitting: Radiation Oncology

## 2019-11-17 ENCOUNTER — Other Ambulatory Visit: Payer: Self-pay

## 2019-11-17 DIAGNOSIS — Z170 Estrogen receptor positive status [ER+]: Secondary | ICD-10-CM | POA: Diagnosis not present

## 2019-11-17 DIAGNOSIS — C50211 Malignant neoplasm of upper-inner quadrant of right female breast: Secondary | ICD-10-CM | POA: Diagnosis not present

## 2019-11-20 ENCOUNTER — Ambulatory Visit
Admission: RE | Admit: 2019-11-20 | Discharge: 2019-11-20 | Disposition: A | Discharge status: Home / Self Care | Payer: Medicare PPO | Source: Ambulatory Visit | Attending: Radiation Oncology | Admitting: Radiation Oncology

## 2019-11-20 ENCOUNTER — Other Ambulatory Visit: Payer: Self-pay

## 2019-11-20 DIAGNOSIS — Z170 Estrogen receptor positive status [ER+]: Secondary | ICD-10-CM | POA: Diagnosis not present

## 2019-11-20 DIAGNOSIS — C50211 Malignant neoplasm of upper-inner quadrant of right female breast: Secondary | ICD-10-CM | POA: Diagnosis not present

## 2019-11-21 ENCOUNTER — Ambulatory Visit
Admission: RE | Admit: 2019-11-21 | Discharge: 2019-11-21 | Disposition: A | Discharge status: Home / Self Care | Payer: Medicare PPO | Source: Ambulatory Visit | Attending: Radiation Oncology | Admitting: Radiation Oncology

## 2019-11-21 ENCOUNTER — Telehealth: Payer: Self-pay | Admitting: Physical Therapy

## 2019-11-21 ENCOUNTER — Other Ambulatory Visit: Payer: Self-pay

## 2019-11-21 DIAGNOSIS — Z170 Estrogen receptor positive status [ER+]: Secondary | ICD-10-CM | POA: Diagnosis not present

## 2019-11-21 DIAGNOSIS — C50211 Malignant neoplasm of upper-inner quadrant of right female breast: Secondary | ICD-10-CM | POA: Diagnosis not present

## 2019-11-21 NOTE — Telephone Encounter (Signed)
Phoned pt to return her call regarding concerns of cellulitis and lymphedema risk. Explained her low risk of both as she had 4 negative axillary nodes removed and is not having her axilla radiated. She had questions regarding reducing her risk of cellulitis and lymphedema and those were addressed. Encouraged her to be aware of her skin on the right arm and keep it clean but not to worry about this possibility because she is very low risk. She knows to contact me with other questions or concerns. Annia Friendly, [place] 11/21/19 5:01 PM

## 2019-11-22 ENCOUNTER — Other Ambulatory Visit: Payer: Self-pay

## 2019-11-22 ENCOUNTER — Ambulatory Visit
Admission: RE | Admit: 2019-11-22 | Discharge: 2019-11-22 | Disposition: A | Discharge status: Home / Self Care | Payer: Medicare PPO | Source: Ambulatory Visit | Attending: Radiation Oncology | Admitting: Radiation Oncology

## 2019-11-22 DIAGNOSIS — Z170 Estrogen receptor positive status [ER+]: Secondary | ICD-10-CM | POA: Diagnosis not present

## 2019-11-22 DIAGNOSIS — C50211 Malignant neoplasm of upper-inner quadrant of right female breast: Secondary | ICD-10-CM | POA: Diagnosis not present

## 2019-11-23 ENCOUNTER — Other Ambulatory Visit: Payer: Self-pay

## 2019-11-23 ENCOUNTER — Ambulatory Visit
Admission: RE | Admit: 2019-11-23 | Discharge: 2019-11-23 | Disposition: A | Discharge status: Home / Self Care | Payer: Medicare PPO | Source: Ambulatory Visit | Attending: Radiation Oncology | Admitting: Radiation Oncology

## 2019-11-23 DIAGNOSIS — C50211 Malignant neoplasm of upper-inner quadrant of right female breast: Secondary | ICD-10-CM | POA: Diagnosis not present

## 2019-11-23 DIAGNOSIS — Z170 Estrogen receptor positive status [ER+]: Secondary | ICD-10-CM | POA: Diagnosis not present

## 2019-11-24 ENCOUNTER — Other Ambulatory Visit: Payer: Self-pay

## 2019-11-24 ENCOUNTER — Ambulatory Visit
Admission: RE | Admit: 2019-11-24 | Discharge: 2019-11-24 | Disposition: A | Discharge status: Home / Self Care | Payer: Medicare PPO | Source: Ambulatory Visit | Attending: Radiation Oncology | Admitting: Radiation Oncology

## 2019-11-24 DIAGNOSIS — Z170 Estrogen receptor positive status [ER+]: Secondary | ICD-10-CM | POA: Diagnosis not present

## 2019-11-24 DIAGNOSIS — C50211 Malignant neoplasm of upper-inner quadrant of right female breast: Secondary | ICD-10-CM | POA: Diagnosis not present

## 2019-11-26 NOTE — Progress Notes (Signed)
Patient Care Team: [name], MD as PCP - General (Family Medicine) [name] as Physician Assistant (Unknown Physician Specialty) [name], RN as Oncology Nurse Navigator [name], RN as Oncology Nurse Navigator [name], MD as Consulting Physician (Hematology and Oncology) [name], MD as Attending Physician (Radiation Oncology) [name], MD as Consulting Physician (General Surgery)  DIAGNOSIS:    ICD-10-CM   1. Malignant neoplasm of upper-inner quadrant of right breast in female, estrogen receptor positive ([place])  C50.211    Z17.0     SUMMARY OF ONCOLOGIC HISTORY: Oncology History  Malignant neoplasm of upper-inner quadrant of right breast in female, estrogen receptor positive ([place])  09/07/2019 Initial Diagnosis   Screening mammogram detected a possible right breast mass. Diagnostic mammogram showed two adjacent and nearly contiguous masses at the 2 o'clock position, 1.7cm, no right axillary adenopathy. Biopsy showed invasive mammary carcinoma, grade 2, HER-2 equivocal by IHC, negative by FISH, ER+ 95%, PR+ 90%, Ki67 2%.   09/19/2019 Genetic Testing   Negative genetic testing:  No pathogenic variants detected on the [facility] Breast Cancer STAT panel, Common Hereditary Cancers panel, or Melanoma panel. A variant of uncertain significance (VUS) was detected in the POLE gene called c.1357C>G. The report date is 09/19/2019.  The STAT Breast cancer panel offered by [facility] includes sequencing and rearrangement analysis for the following 9 genes:  ATM, BRCA1, BRCA2, CDH1, CHEK2, PALB2, PTEN, STK11 and TP53. The Common Hereditary Cancers Panel offered by [facility] includes sequencing and/or deletion duplication testing of the following 48 genes: APC, ATM, AXIN2, BARD1, BMPR1A, BRCA1, BRCA2, BRIP1, CDH1, CDK4, CDKN2A (p14ARF), CDKN2A (p16INK4a), CHEK2, CTNNA1, DICER1, EPCAM (Deletion/duplication testing only), GREM1 (promoter region  deletion/duplication testing only), KIT, MEN1, MLH1, MSH2, MSH3, MSH6, MUTYH, NBN, NF1, NHTL1, PALB2, PDGFRA, PMS2, POLD1, POLE, PTEN, RAD50, RAD51C, RAD51D, RNF43, SDHB, SDHC, SDHD, SMAD4, SMARCA4. STK11, TP53, TSC1, TSC2, and VHL.  The following genes were evaluated for sequence changes only: SDHA and HOXB13 c.251G>A variant only. The Melanoma panel offered by [facility] includes sequencing and/or deletion duplication testing of the following 9 genes: BAP1, BRCA2, BRIP1, CDK4, CDKN2A (p14ARF), CDKN2A (p16INK4a), POT1, PTEN, RB1, and TP53.  The following gene was evaluated for sequence changes only: MITF (c.952G>A, p.Glu318Lys variant only).    09/27/2019 Surgery   Right lumpectomy 09/27/2019: Grade 2 IDC 1.5 cm with intermediate grade DCIS, margins negative, 0/4 lymph nodes negative, ER 95%, PR 90%, HER-2 negative, Ki-67 2%   10/04/2019 Cancer Staging   Staging form: Breast, AJCC 8th Edition - Pathologic stage from 10/04/2019: Stage IA (pT1c, pN0, cM0, G2, ER+, PR+, HER2-) - Signed by [name], MD on 10/04/2019   10/11/2019 Oncotype testing   Oncotype score: 9, distant recurrence at 9 years: 3%   11/08/2019 - 12/04/2019 Radiation Therapy   Adjuvant radiation      CHIEF COMPLIANT: Follow-up to discuss antiestrogen therapy   INTERVAL HISTORY: Catherine Long is a 69 y.o. with above-mentioned history of right breast cancer who underwent a right lumpectomy and is currently undergoing radiation treatment. She presents to the clinic today to discuss antiestrogen therapy.  Apart from mild radiation dermatitis, she has tolerated radiation extremely well.  ALLERGIES:  has No Known Allergies.  MEDICATIONS:  Current Outpatient Medications  Medication Sig Dispense Refill  . Ascorbic Acid (VITAMIN C) 1000 MG tablet Take 1,000 mg by mouth daily.     . B Complex-C (SUPER B COMPLEX) TABS Take 1 tablet by mouth daily.    Marland Kitchen  Calcium Carbonate-Vitamin D3 (CALCIUM 600/VITAMIN D) 600-400 MG-UNIT TABS Take 2  tablets by mouth daily.    . Cholecalciferol (VITAMIN D) 50 MCG (2000 UT) tablet Take 2,000 Units by mouth daily.     . Hypromellose (SYSTANE OVERNIGHT THERAPY OP) Place 1 drop into both eyes at bedtime.    . Multiple Vitamins-Minerals (MULTI COMPLETE/IRON PO) Take 1 tablet by mouth daily.    . traMADol (ULTRAM) 50 MG tablet Take 1 tablet (50 mg total) by mouth every 6 (six) hours as needed. (Patient not taking: Reported on 10/23/2019) 20 tablet 0  . Turmeric 500 MG CAPS Take 500 mg by mouth daily.      No current facility-administered medications for this visit.   Facility-Administered Medications Ordered in Other Visits  Medication Dose Route Frequency Provider Last Rate Last Admin  . Sonafine emulsion 1 application  1 application Topical BID [name], MD   1 application at 23/76/28 [phone]    PHYSICAL EXAMINATION: ECOG PERFORMANCE STATUS: 1 - Symptomatic but completely ambulatory  There were no vitals filed for this visit. There were no vitals filed for this visit.  LABORATORY DATA:  I have reviewed the data as listed CMP Latest Ref Rng & Units 09/13/2019 05/19/2019 05/17/2018  Glucose 70 - 99 mg/dL 101(H) 94 86  BUN 8 - 23 mg/dL '11 13 9  '$ Creatinine 0.44 - 1.00 mg/dL 0.82 0.77 0.75  Sodium 135 - 145 mmol/L 142 139 140  Potassium 3.5 - 5.1 mmol/L 4.3 5.2 4.8  Chloride 98 - 111 mmol/L 104 101 103  CO2 22 - 32 mmol/L '26 26 29  '$ Calcium 8.9 - 10.3 mg/dL 9.4 9.7 9.6  Total Protein 6.5 - 8.1 g/dL 7.8 6.6 7.0  Total Bilirubin 0.3 - 1.2 mg/dL 0.4 0.5 0.5  Alkaline Phos 38 - 126 U/L 62 - -  AST 15 - 41 U/L '19 18 18  '$ ALT 0 - 44 U/L '18 12 17    '$ Lab Results  Component Value Date   WBC 8.8 09/25/2019   HGB 13.8 09/25/2019   HCT 43.6 09/25/2019   MCV 91.4 09/25/2019   PLT 315 09/25/2019   NEUTROABS 8.9 (H) 09/13/2019    ASSESSMENT & PLAN:  Malignant neoplasm of upper-inner quadrant of right breast in female, estrogen receptor positive ([place]) 09/07/2019:Screening mammogram detected  a possible right breast mass. Diagnostic mammogram showed two adjacent and nearly contiguous masses at the 2 o'clock position, 1.7cm, no right axillary adenopathy. Biopsy showed invasive mammary carcinoma, grade 2, HER-2 equivocal by IHC, negative by FISH, ER+ 95%, PR+ 90%, Ki67 2%. T1CN0 stage Ia clinical stage  Right lumpectomy 09/27/2019: Grade 2 IDC 1.5 cm with intermediate grade DCIS, margins negative, 0/4 lymph nodes negative, ER 95%, PR 90%, HER-2 negative, Ki-67 2% Oncotype score: 9, distant recurrence at 9 years: 3%  Adjuvant radiation: 11/08/2019-12/04/2019   Recommendation: Adjuvant antiestrogen therapy with anastrozole 1 mg daily x5 to 7 years to be started 12/21/2019 Patient agreed to participate in antiestrogen therapy adherence and compliance patient reported outcomes study Return to clinic in 3 months for survivorship care plan visit    No orders of the defined types were placed in this encounter.  The patient has a good understanding of the overall plan. she agrees with it. she will call with any problems that may develop before the next visit here.  Total time spent: 30 mins including face to face time and time spent for planning, charting and coordination of care  [name], MD 11/27/2019  I, Molly Dorshimer, am acting as scribe for Dr. [name].  I have reviewed the above documentation for accuracy and completeness, and I agree with the above.

## 2019-11-27 ENCOUNTER — Ambulatory Visit
Admission: RE | Admit: 2019-11-27 | Discharge: 2019-11-27 | Disposition: A | Discharge status: Home / Self Care | Payer: Medicare PPO | Source: Ambulatory Visit | Attending: Radiation Oncology | Admitting: Radiation Oncology

## 2019-11-27 ENCOUNTER — Encounter: Payer: Self-pay | Admitting: *Deleted

## 2019-11-27 ENCOUNTER — Inpatient Hospital Stay: Payer: Medicare PPO | Attending: Hematology and Oncology | Admitting: Hematology and Oncology

## 2019-11-27 ENCOUNTER — Other Ambulatory Visit: Payer: Self-pay

## 2019-11-27 DIAGNOSIS — C50211 Malignant neoplasm of upper-inner quadrant of right female breast: Secondary | ICD-10-CM

## 2019-11-27 DIAGNOSIS — Z170 Estrogen receptor positive status [ER+]: Secondary | ICD-10-CM | POA: Insufficient documentation

## 2019-11-27 MED ORDER — ANASTROZOLE 1 MG PO TABS: 1.0000 mg | ORAL_TABLET | Freq: Every day | ORAL | 3 refills | Status: DC

## 2019-11-27 MED ORDER — SONAFINE EX EMUL
1.0000 "application " | Freq: Two times a day (BID) | CUTANEOUS | Status: DC
  Administered 2019-11-27: 1 via TOPICAL

## 2019-11-27 NOTE — Research (Signed)
11/27/19 at 12:21pm -  [place] Consent Notes- Dr. Lindi Adie felt the pt was a good candidate for the UpBeat study.  The research nurse and the pt met for 25 minutes going over in detail about the study and her participation.  The pt denied any history of claustrophobia.  She also verified that she can walk 2 blocks without chest pain, dyspnea, shortness of breath or fainting.  She mentioned some knee issues, but she said that she is able to walk and do all of her normal activities. The pt was informed about the study assessments, and she was informed that she would be compensated $25 for completion of her baseline, month 3, month 12 and month 24 visits.  The pt seemed very interested in the study.  She agreed to take the consent form home and read it tonight along with the hipaa form.  The pt was given the UpBeat brochure along with the research nurse's business card.  The pt was encouraged to call the research nurse if she has any questions/concerns about the study.  The pt agreed to meet with the research nurse tomorrow at 8:45am to review the consent form in person and to answer all of her questions.  Brion Aliment RN, BSN, CCRP Clinical Research Nurse 11/27/2019 12:26 PM   11/28/19 at 11:11am - The pt was into the cancer center this morning for her routine radiation treatment.  The pt met with the research nurse.  Hendricks Limes, research nurse, was also present during the consent visit.  The pt stated that she read over the entire consent form and hipaa form last night.  The pt had questions regarding her study follow up period.  The research nurse answered all of her questions.  The research nurse reviewed each page of the consent form and hipaa form with the pt.  The pt stated that she wanted to participate in the study.  The pt signed consent form and hipaa form at 8:58am.  The pt was given a copy of her signed forms for her records.  The research nurse reviewed the inclusion and exclusion criteria with  the pt.  The pt confirmed that she can "hold her breath for 10 seconds".  The pt mentioned that she has had bilateral hip replacements.  Ty Hilts, MRI tech, stated that the pt is okay to get a MRI.  The nurse read all of the MRI exclusions with the pt, and the pt denied any of the listed clips, metals, devices, etc.  The pt met all of the inclusion/exclusion criteria for enrollment.  The pt's eligibility was confirmed by 2nd nurse, Hendricks Limes.  The pt agreed to participate in the optional studies.  She also agreed to receive her questionnaires by email.  The research nurse will proceed with registration and then schedule the pt's baseline assessments to be done by the start of her AI therapy on 12/21/19.   Brion Aliment RN, BSN, CCRP  Clinical Research Nurse 11/28/2019 11:24 AM   11/29/19 at 1:55pm - The research nurse called the pt this morning and went over her new, updated schedule with her.  The pt agreed to come in early on Friday, 12/01/19 for her research labs, her baseline assessments and her cardiac MRI.  The pt was informed that she will need to fast for 3 hours prior to Friday's blood draw.  The pt was also told to wear comfortable, loose-fitting clothes for her physical testing assessments.  The pt also agreed  to come in early on 5/3 for her baseline vital signs.  Dr. Lindi Adie reviewed the Enrollment/Eligibility Checklist, and he confirmed the pt met all of the inclusion and exclusion criteria for enrollment.  The research nurse will proceed with the pt's registration. The nurse will also forward the pt's consent forms to [facility].   Brion Aliment RN, BSN, CCRP  Clinical Research Nurse 11/29/2019 2:00 PM   11/29/19 at 2:51 pm- The pt was successfully registered to the UpBeat study and was assigned the PID [id number].   Brion Aliment RN, BSN, CCRP Clinical Research Nurse 11/29/2019 2:53 PM

## 2019-11-27 NOTE — Research (Signed)
11/27/19 at 1:23pm- DCP-001 study notes- Dr. Lindi Adie referred the pt to the UpBeat study.  The pt is considering participation in this study.  The research nurse informed the pt about this study.  The pt was told that her participation is voluntary.  The pt was given a copy of the consent form and hipaa form to take home and read.  The pt agreed to meet with the research nurse on 11/28/19 to go over the study in person.  Brion Aliment RN, BSN, CCRP Clinical Research Nurse 11/27/2019 1:30 PM   11/28/19 at 10:24am - The pt was into the cancer center this morning for her routine radiation appt.  The pt met with the research nurse to discuss her participation in the study.  The pt said that she read the DCP-001 consent and hipaa form last night.  The pt said that she had no questions/concerns about participating in the study.  The pt signed the consent form and hipaa form at 9:05am.  The pt was given a copy of her signed forms for her records.  The pt meets eligibility and will be enrolled in the DCP-001 study.  The pt completed the DCP worksheet.  The pt was thanked for her support and participation in this study.  Brion Aliment RN, BSN, CCRP Clinical Research Nurse 11/28/2019 10:33 AM

## 2019-11-27 NOTE — Assessment & Plan Note (Signed)
09/07/2019:Screening mammogram detected a possible right breast mass. Diagnostic mammogram showed two adjacent and nearly contiguous masses at the 2 o'clock position, 1.7cm, no right axillary adenopathy. Biopsy showed invasive mammary carcinoma, grade 2, HER-2 equivocal by IHC, negative by FISH, ER+ 95%, PR+ 90%, Ki67 2%. T1CN0 stage Ia clinical stage  Right lumpectomy 09/27/2019: Grade 2 IDC 1.5 cm with intermediate grade DCIS, margins negative, 0/4 lymph nodes negative, ER 95%, PR 90%, HER-2 negative, Ki-67 2% Oncotype score: 9, distant recurrence at 9 years: 3%  Adjuvant radiation: 11/08/2019-12/04/2019   Recommendation: Adjuvant antiestrogen therapy with anastrozole 1 mg daily x5 to 7 years Patient agreed to participate in antiestrogen therapy adherence and compliance patient reported outcomes study Return to clinic in 3 months for survivorship care plan visit

## 2019-11-28 ENCOUNTER — Other Ambulatory Visit (HOSPITAL_COMMUNITY): Payer: Self-pay | Admitting: Hematology and Oncology

## 2019-11-28 ENCOUNTER — Telehealth: Payer: Self-pay | Admitting: Adult Health

## 2019-11-28 ENCOUNTER — Ambulatory Visit
Admission: RE | Admit: 2019-11-28 | Discharge: 2019-11-28 | Disposition: A | Discharge status: Home / Self Care | Payer: Medicare PPO | Source: Ambulatory Visit | Attending: Radiation Oncology | Admitting: Radiation Oncology

## 2019-11-28 ENCOUNTER — Encounter: Payer: Self-pay | Admitting: *Deleted

## 2019-11-28 ENCOUNTER — Inpatient Hospital Stay: Payer: Medicare PPO | Admitting: *Deleted

## 2019-11-28 ENCOUNTER — Other Ambulatory Visit: Payer: Self-pay

## 2019-11-28 DIAGNOSIS — Z006 Encounter for examination for normal comparison and control in clinical research program: Secondary | ICD-10-CM

## 2019-11-28 DIAGNOSIS — C50211 Malignant neoplasm of upper-inner quadrant of right female breast: Secondary | ICD-10-CM | POA: Diagnosis not present

## 2019-11-28 DIAGNOSIS — Z170 Estrogen receptor positive status [ER+]: Secondary | ICD-10-CM | POA: Diagnosis not present

## 2019-11-28 NOTE — Telephone Encounter (Signed)
Scheduled per 04/26 los, patient has been called and voicemail was left. 

## 2019-11-29 ENCOUNTER — Other Ambulatory Visit: Payer: Self-pay

## 2019-11-29 ENCOUNTER — Telehealth: Payer: Self-pay | Admitting: Adult Health

## 2019-11-29 ENCOUNTER — Ambulatory Visit
Admission: RE | Admit: 2019-11-29 | Discharge: 2019-11-29 | Disposition: A | Discharge status: Home / Self Care | Payer: Medicare PPO | Source: Ambulatory Visit | Attending: Radiation Oncology | Admitting: Radiation Oncology

## 2019-11-29 ENCOUNTER — Encounter (INDEPENDENT_AMBULATORY_CARE_PROVIDER_SITE_OTHER): Payer: Self-pay

## 2019-11-29 DIAGNOSIS — Z170 Estrogen receptor positive status [ER+]: Secondary | ICD-10-CM | POA: Diagnosis not present

## 2019-11-29 DIAGNOSIS — C50211 Malignant neoplasm of upper-inner quadrant of right female breast: Secondary | ICD-10-CM | POA: Diagnosis not present

## 2019-11-29 NOTE — Telephone Encounter (Signed)
LMOM for patient to call back so we can discuss AET PRO study.  Wilber Bihari, NP

## 2019-11-30 ENCOUNTER — Ambulatory Visit
Admission: RE | Admit: 2019-11-30 | Discharge: 2019-11-30 | Disposition: A | Discharge status: Home / Self Care | Payer: Medicare PPO | Source: Ambulatory Visit | Attending: Radiation Oncology | Admitting: Radiation Oncology

## 2019-11-30 ENCOUNTER — Other Ambulatory Visit: Payer: Self-pay

## 2019-11-30 DIAGNOSIS — Z170 Estrogen receptor positive status [ER+]: Secondary | ICD-10-CM | POA: Diagnosis not present

## 2019-11-30 DIAGNOSIS — C50211 Malignant neoplasm of upper-inner quadrant of right female breast: Secondary | ICD-10-CM | POA: Diagnosis not present

## 2019-12-01 ENCOUNTER — Inpatient Hospital Stay: Payer: Medicare PPO

## 2019-12-01 ENCOUNTER — Other Ambulatory Visit: Payer: Self-pay

## 2019-12-01 ENCOUNTER — Encounter: Payer: Self-pay | Admitting: *Deleted

## 2019-12-01 ENCOUNTER — Ambulatory Visit (HOSPITAL_COMMUNITY)
Admission: RE | Admit: 2019-12-01 | Discharge: 2019-12-01 | Disposition: A | Discharge status: Home / Self Care | Payer: Self-pay | Source: Ambulatory Visit | Attending: Hematology and Oncology | Admitting: Hematology and Oncology

## 2019-12-01 ENCOUNTER — Ambulatory Visit
Admission: RE | Admit: 2019-12-01 | Discharge: 2019-12-01 | Disposition: A | Discharge status: Home / Self Care | Payer: Medicare PPO | Source: Ambulatory Visit | Attending: Radiation Oncology | Admitting: Radiation Oncology

## 2019-12-01 DIAGNOSIS — C50211 Malignant neoplasm of upper-inner quadrant of right female breast: Secondary | ICD-10-CM

## 2019-12-01 DIAGNOSIS — Z170 Estrogen receptor positive status [ER+]: Secondary | ICD-10-CM | POA: Diagnosis not present

## 2019-12-01 DIAGNOSIS — Z006 Encounter for examination for normal comparison and control in clinical research program: Secondary | ICD-10-CM

## 2019-12-01 LAB — RESEARCH LABS

## 2019-12-01 NOTE — Research (Signed)
12/01/19 @ 11:42am - [id number] Baseline Assessments - The pt was into the cancer center this morning for her baseline research labs.  The pt confirmed that she had been fasting overnight for her blood draw.  The pt then had her routine radiation treatment.  After the pt's radiation treatment, the pt came to the research department to complete her baseline assessments.  The pt completed her neurocognitive baseline booklet with Remer Macho, research specialist.  The pt then completed her baseline self-administered questionnaires and the [facility] Cardiomyopathy Questionnaire.  The pt score a "1" on the page 10 depression assessment.  Therefore, no further intervention is required at this time point.  The pt began the Covid 19 questionnaire, but she did not have time to complete.  The pt will complete it on Monday.  The pt then completed her physical testing with 2 research nurses.  The pt's waist measurement was obtained, and it was 37.5 inches.  The pt was walked to the MRI department for her cardiac MRI.  The pt will come to the [facility] on Monday, 12/04/19 to complete her baseline assessments.  The pt completes her radiation treatment on Monday, and the research nurse will ask the dosimetry department to help complete her study RT forms.  The pt said that she will begin her Arimidex on 12/21/19 per Dr. Geralyn Flash instructions.  The pt was thanked for her support of this study.  Brion Aliment RN, BSN, CCRP Clinical Research Nurse 12/01/2019 12:01 PM   12/04/19 at 10:53am - WF Baseline Assessments continued- The pt was into the cancer center this morning for her final radiation treatment.  The pt had her baseline vital signs obtained per protocol guidelines this morning.  The pt then met with the research nurse, and the pt completed her Covid questionnaire.  The pt was told that her month 3 questionnaires would be emailed to her as she has requested.  The research nurse reviewed the pt's baseline lab values with her.  The pt had no  abnormal values.  Dr. Lindi Adie also reviewed the pt's baseline lab values.  The pt is aware that her next on-study visit is for her month 1 labs.  The pt requested that these labs be drawn on 01/22/20 at 8am.  The pt said she is scheduled to begin her Arimidex 1 mg on 12/21/19.  The pt was encouraged to call the research nurse and confirm the date she actually began her endocrine therapy so the nurse can document the start date in the medical record.  All of the pt's future study assessments will be based on this date.  The pt was given her $12 [employer] gift card since she has completed all of her baseline activities.  The pt's research samples were shipped to [facility] today.  The pt was thanked for her participation on this trial.  Brion Aliment RN, BSN, CCRP  Clinical Research Nurse 12/04/2019 11:00 AM

## 2019-12-04 ENCOUNTER — Other Ambulatory Visit: Payer: Self-pay

## 2019-12-04 ENCOUNTER — Ambulatory Visit
Admission: RE | Admit: 2019-12-04 | Discharge: 2019-12-04 | Disposition: A | Discharge status: Home / Self Care | Payer: Medicare PPO | Source: Ambulatory Visit | Attending: Radiation Oncology | Admitting: Radiation Oncology

## 2019-12-04 ENCOUNTER — Encounter: Payer: Self-pay | Admitting: Radiation Oncology

## 2019-12-04 ENCOUNTER — Inpatient Hospital Stay: Payer: Medicare PPO | Attending: Hematology and Oncology

## 2019-12-04 DIAGNOSIS — Z170 Estrogen receptor positive status [ER+]: Secondary | ICD-10-CM | POA: Insufficient documentation

## 2019-12-04 DIAGNOSIS — C50211 Malignant neoplasm of upper-inner quadrant of right female breast: Secondary | ICD-10-CM | POA: Diagnosis not present

## 2019-12-25 ENCOUNTER — Encounter: Payer: Self-pay | Admitting: *Deleted

## 2019-12-25 NOTE — Progress Notes (Signed)
12/25/19 at 9:52am - [id number]/UpBeat call to pt- The research nurse called the pt on 12/21/19 and left a message on the pt's VM to call the nurse with the actual start date of her hormonal therapy- anastrozole.  The study uses this date for all future follow up visits.  The pt called the nurse this morning, and she confirmed that she did indeed start her anastrozole on Thursday, 12/21/19.  The research nurse also verified the pt's next on-study appt with the pt. The pt verbalized knowledge of her 01/22/20 8am appt.   Brion Aliment RN, BSN, CCRP Clinical Research Nurse 12/25/2019 10:04 AM

## 2020-01-02 ENCOUNTER — Encounter: Payer: Self-pay | Admitting: Family Medicine

## 2020-01-02 DIAGNOSIS — Z85828 Personal history of other malignant neoplasm of skin: Secondary | ICD-10-CM | POA: Diagnosis not present

## 2020-01-02 DIAGNOSIS — C44722 Squamous cell carcinoma of skin of right lower limb, including hip: Secondary | ICD-10-CM | POA: Diagnosis not present

## 2020-01-02 DIAGNOSIS — L821 Other seborrheic keratosis: Secondary | ICD-10-CM | POA: Diagnosis not present

## 2020-01-02 DIAGNOSIS — L570 Actinic keratosis: Secondary | ICD-10-CM | POA: Diagnosis not present

## 2020-01-02 DIAGNOSIS — D2261 Melanocytic nevi of right upper limb, including shoulder: Secondary | ICD-10-CM | POA: Diagnosis not present

## 2020-01-02 DIAGNOSIS — D2272 Melanocytic nevi of left lower limb, including hip: Secondary | ICD-10-CM | POA: Diagnosis not present

## 2020-01-02 DIAGNOSIS — L814 Other melanin hyperpigmentation: Secondary | ICD-10-CM | POA: Diagnosis not present

## 2020-01-02 DIAGNOSIS — D1801 Hemangioma of skin and subcutaneous tissue: Secondary | ICD-10-CM | POA: Diagnosis not present

## 2020-01-02 DIAGNOSIS — D485 Neoplasm of uncertain behavior of skin: Secondary | ICD-10-CM | POA: Diagnosis not present

## 2020-01-04 NOTE — Progress Notes (Signed)
Ms. Bohlinger presents today for 1 month follow-up after completing radiation to the right breast on 12/04/2019  Skin: Healing well. Still applying Sonafine twice a day to treatment field Lympedema: Feels right breast is significantly more swollen than left Pain: Right nipple is still very tender, and occasional joint discomfort from anastrozole.  ROM: No restrictions Other notable issues, if any: Survivorship care plan visit with Mendel Ryder Causey-NP on 02/28/2020  Wt Readings from Last 3 Encounters:  01/05/20 148 lb 6.4 oz (67.3 kg)  12/04/19 148 lb 8 oz (67.4 kg)  11/27/19 148 lb 14.4 oz (67.5 kg)   Vitals:   01/05/20 1015  BP: (!) 119/54  Pulse: 63  Resp: 20  Temp: 98.1 F (36.7 C)  SpO2: 98%  Weight: 148 lb 6.4 oz (67.3 kg)  Height: 5\' 4"  (1.626 m)

## 2020-01-05 ENCOUNTER — Encounter: Payer: Self-pay | Admitting: Radiation Oncology

## 2020-01-05 ENCOUNTER — Other Ambulatory Visit: Payer: Self-pay

## 2020-01-05 ENCOUNTER — Ambulatory Visit
Admission: RE | Admit: 2020-01-05 | Discharge: 2020-01-05 | Disposition: A | Discharge status: Home / Self Care | Payer: Medicare PPO | Source: Ambulatory Visit | Attending: Radiation Oncology | Admitting: Radiation Oncology

## 2020-01-05 VITALS — BP 119/54 | HR 63 | Temp 98.1°F | Resp 20 | Ht 64.0 in | Wt 148.4 lb

## 2020-01-05 DIAGNOSIS — Z923 Personal history of irradiation: Secondary | ICD-10-CM | POA: Diagnosis not present

## 2020-01-05 DIAGNOSIS — I890 Lymphedema, not elsewhere classified: Secondary | ICD-10-CM | POA: Diagnosis not present

## 2020-01-05 DIAGNOSIS — Z170 Estrogen receptor positive status [ER+]: Secondary | ICD-10-CM | POA: Diagnosis not present

## 2020-01-05 DIAGNOSIS — Z79811 Long term (current) use of aromatase inhibitors: Secondary | ICD-10-CM | POA: Diagnosis not present

## 2020-01-05 DIAGNOSIS — C50211 Malignant neoplasm of upper-inner quadrant of right female breast: Secondary | ICD-10-CM | POA: Diagnosis not present

## 2020-01-08 ENCOUNTER — Encounter: Payer: Self-pay | Admitting: Radiation Oncology

## 2020-01-08 NOTE — Progress Notes (Signed)
  Radiation Oncology         [phone]) [phone] ________________________________  Name: Catherine Long MRN: [id number]  Date: 01/05/2020  DOB: [birth year]  Follow-Up Visit Note  Outpatient  CC: [name], MD  [name], MD  Diagnosis and Prior Radiotherapy:    ICD-10-CM   1. Malignant neoplasm of upper-inner quadrant of right breast in female, estrogen receptor positive ([place])  C50.211    Z17.0     CHIEF COMPLAINT: Here for follow-up and surveillance of breast cancer  Narrative:  The patient returns today for routine follow-up.   Ms. Mattson presents today for 1 month follow-up after completing radiation to the right breast on 12/04/2019  Skin: Healing well. Still applying Sonafine twice a day to treatment field Lympedema: Feels right breast is significantly more swollen than left Pain: Right nipple is still very tender, and occasional joint discomfort from anastrozole.  ROM: No restrictions Other notable issues, if any: Survivorship care plan visit with Mendel Ryder Causey-NP on 02/28/2020                                ALLERGIES:  has No Known Allergies.  Meds: Current Outpatient Medications  Medication Sig Dispense Refill  . anastrozole (ARIMIDEX) 1 MG tablet Take 1 tablet (1 mg total) by mouth daily. 90 tablet 3  . B Complex-C (SUPER B COMPLEX) TABS Take 1 tablet by mouth daily.    . Calcium Carbonate-Vitamin D3 (CALCIUM 600/VITAMIN D) 600-400 MG-UNIT TABS Take 2 tablets by mouth daily.    . Cholecalciferol (VITAMIN D) 50 MCG (2000 UT) tablet Take 2,000 Units by mouth daily.     . Hypromellose (SYSTANE OVERNIGHT THERAPY OP) Place 1 drop into both eyes at bedtime.    . Multiple Vitamins-Minerals (MULTI COMPLETE/IRON PO) Take 1 tablet by mouth daily.    . Turmeric 500 MG CAPS Take 500 mg by mouth daily.      No current facility-administered medications for this encounter.    Physical Findings: The patient is in no acute distress. Patient is alert and  oriented.  height is 5\' 4"  (1.626 m) and weight is 148 lb 6.4 oz (67.3 kg). Her temperature is 98.1 F (36.7 C). Her blood pressure is 119/54 (abnormal) and her pulse is 63. Her respiration is 20 and oxygen saturation is 98%. .    Satisfactory skin healing in radiotherapy fields.  There is still some swelling present in the radiation fields.  None of this is a clinical concern.   Lab Findings: Lab Results  Component Value Date   WBC 8.8 09/25/2019   HGB 13.8 09/25/2019   HCT 43.6 09/25/2019   MCV 91.4 09/25/2019   PLT 315 09/25/2019    Radiographic Findings: No results found.  Impression/Plan: Healing well from radiotherapy to the breast tissue.  Continue skin care with topical Vitamin E Oil and / or lotion for at least 2 more months for further healing.  I encouraged her to continue with yearly mammography as appropriate (for intact breast tissue) and followup with medical oncology. I will see her back on an as-needed basis. I have encouraged her to call if she has any issues or concerns in the future. I wished her the very best.  On date of service, in total, I spent 15 minutes on this encounter.  She was seen in person. _____________________________________   [name], MD

## 2020-01-10 DIAGNOSIS — N8111 Cystocele, midline: Secondary | ICD-10-CM | POA: Diagnosis not present

## 2020-01-10 DIAGNOSIS — N812 Incomplete uterovaginal prolapse: Secondary | ICD-10-CM | POA: Diagnosis not present

## 2020-01-10 DIAGNOSIS — N952 Postmenopausal atrophic vaginitis: Secondary | ICD-10-CM | POA: Diagnosis not present

## 2020-01-10 DIAGNOSIS — Z853 Personal history of malignant neoplasm of breast: Secondary | ICD-10-CM | POA: Diagnosis not present

## 2020-01-19 ENCOUNTER — Other Ambulatory Visit: Payer: Self-pay | Admitting: *Deleted

## 2020-01-19 ENCOUNTER — Other Ambulatory Visit (HOSPITAL_COMMUNITY): Payer: Self-pay | Admitting: Hematology and Oncology

## 2020-01-19 DIAGNOSIS — Z170 Estrogen receptor positive status [ER+]: Secondary | ICD-10-CM

## 2020-01-19 DIAGNOSIS — Z006 Encounter for examination for normal comparison and control in clinical research program: Secondary | ICD-10-CM

## 2020-01-19 DIAGNOSIS — C50211 Malignant neoplasm of upper-inner quadrant of right female breast: Secondary | ICD-10-CM

## 2020-01-22 ENCOUNTER — Ambulatory Visit: Payer: Medicare PPO | Attending: Hematology and Oncology | Admitting: Physical Therapy

## 2020-01-22 ENCOUNTER — Other Ambulatory Visit: Payer: Self-pay

## 2020-01-22 ENCOUNTER — Inpatient Hospital Stay: Payer: Medicare PPO | Attending: Hematology and Oncology

## 2020-01-22 ENCOUNTER — Encounter: Payer: Self-pay | Admitting: *Deleted

## 2020-01-22 DIAGNOSIS — Z170 Estrogen receptor positive status [ER+]: Secondary | ICD-10-CM | POA: Insufficient documentation

## 2020-01-22 DIAGNOSIS — C50211 Malignant neoplasm of upper-inner quadrant of right female breast: Secondary | ICD-10-CM

## 2020-01-22 LAB — RESEARCH LABS

## 2020-01-22 NOTE — Therapy (Signed)
[place], [place], [address] Phone: [phone]   Fax:  [phone]  Physical Therapy Treatment  Patient Details  Name: Catherine Long MRN: [id number] Date of Birth: [birth year] Referring Provider (PT): Dr. [name]   Encounter Date: 01/22/2020    Past Medical History:  Diagnosis Date  . Asthma    several years ago  . Family history of breast cancer   . Family history of melanoma   . Family history of ovarian cancer   . History of melanoma   . Melanoma ([place])   . PONV (postoperative nausea and vomiting)   . Right middle lobe syndrome     Past Surgical History:  Procedure Laterality Date  . RADIOACTIVE SEED GUIDED PARTIAL MASTECTOMY WITH AXILLARY SENTINEL LYMPH NODE BIOPSY Right 09/27/2019   Procedure: RIGHT RADIOACTIVE SEED GUIDED LUMPECTOMY WITH  SENTINEL LYMPH NODE BIOPSY;  Surgeon: [name], MD;  Location: [facility];  Service: General;  Laterality: Right;  LMA  . TONSILLECTOMY    . TOTAL HIP ARTHROPLASTY  2003 and 2006   Bilateral    There were no vitals filed for this visit.   Subjective Assessment - 01/22/20 0938    Subjective Pt here for 3 month L-dex  check up                  L-DEX FLOWSHEETS - 01/22/20 0900      L-DEX LYMPHEDEMA SCREENING   Measurement Type Unilateral    L-DEX MEASUREMENT EXTREMITY Upper Extremity    POSITION  Standing    DOMINANT SIDE Right    At Risk Side Right    BASELINE SCORE (UNILATERAL) 2.1    L-DEX SCORE (UNILATERAL) 2.5    VALUE CHANGE (UNILAT) 0.4                                  PT Long Term Goals - 10/19/19 1249      PT LONG TERM GOAL #1   Title Patient will demonstrate she has regained full shoulder ROM and function post operatively compared to baselines.    Time 8    Period Weeks    Status Achieved                  Patient will benefit from skilled therapeutic intervention in  order to improve the following deficits and impairments:     Visit Diagnosis: Malignant neoplasm of upper-inner quadrant of right breast in female, estrogen receptor positive ([place])     Problem List Patient Active Problem List   Diagnosis Date Noted  . Genetic testing 09/20/2019  . History of melanoma   . Family history of breast cancer   . Family history of ovarian cancer   . Family history of melanoma   . Malignant neoplasm of upper-inner quadrant of right breast in female, estrogen receptor positive ([place]) 09/07/2019  . Osteopenia 05/11/2017  . Asthma, chronic, resolved 04/26/2012  . Right middle lobe syndrome 04/26/2012   Donato Heinz. Owens Shark PT  Norwood Levo 01/22/2020, 9:40 AM  [place], [place], [address] Phone: [phone]   Fax:  [phone]  Name: Catherine Long MRN: [id number] Date of Birth: 1950-11-15

## 2020-01-22 NOTE — Research (Signed)
01/22/2020 at 11:49am- WF [id number]/ UpBEAT- 1 month visit - The pt was into the [facility] this morning for her 1 month visit.  The pt had her research samples drawn.  Her samples will be shipped today by Remer Macho, research specialist.  The pt was given a copy of her baseline MRI clinical report for her records.  The pt has not been hospitalized since her last visit.  The pt denies the following cardiac problems since her last visit:  heart attack, angioplasty, bypass operation, heart catheterization, stroke, or heart failure.  The pt has a survivorship appointment with Gardenia Phlegm, NP on 02/28/20, and the pt agreed to have her month 3 visit assessments completed on this day as well.  The pt is aware of her month 3 appts on 02/28/20.  The pt was thanked for her support of the UpBEAT study. Brion Aliment RN, BSN, CCRP Clinical Research Nurse 01/22/2020 11:59 AM

## 2020-01-29 ENCOUNTER — Ambulatory Visit: Payer: Self-pay

## 2020-01-30 NOTE — Progress Notes (Signed)
  Patient Name: Catherine Long MRN: [id number] DOB: October 06, 1950 Referring Physician: Jenna Luo (Profile Not Attached) Date of Service: 12/04/2019 [facility] [place]                                                        End Of Treatment Note  Diagnoses: C50.211-Malignant neoplasm of upper-inner quadrant of right female breast  Cancer Staging: Cancer Staging Malignant neoplasm of upper-inner quadrant of right breast in female, estrogen receptor positive ([place]) Staging form: Breast, AJCC 8th Edition - Clinical stage from 09/13/2019: Stage IA (cT1c, cN0, cM0, G2, ER+, PR+, HER2-) - Unsigned - Pathologic stage from 10/04/2019: Stage IA (pT1c, pN0, cM0, G2, ER+, PR+, HER2-) - Signed by [name], MD on 10/04/2019   Intent: Curative  Radiation Treatment Dates: 11/07/2019 through 12/04/2019  Site Technique Total Dose (Gy) Dose per Fx (Gy) Completed Fx Beam Energies  Breast, Right: Breast_Rt 3D 40.05/40.05 2.67 15/15 6X  Breast, Right: Breast_Rt_Bst specialPort 10/10 2 5/5 6E, 9E   Narrative: The patient tolerated radiation therapy relatively well.   Plan: The patient will follow-up with radiation oncology in 1 mo, or PRN . -----------------------------------  [name], MD

## 2020-02-23 ENCOUNTER — Encounter: Payer: Self-pay | Admitting: Genetic Counselor

## 2020-02-27 ENCOUNTER — Telehealth: Payer: Self-pay | Admitting: Oncology

## 2020-02-27 NOTE — Progress Notes (Signed)
SURVIVORSHIP VISIT:    BRIEF ONCOLOGIC HISTORY:  Oncology History  Malignant neoplasm of upper-inner quadrant of right breast in female, estrogen receptor positive (HCC)  09/07/2019 Initial Diagnosis   Screening mammogram detected a possible right breast mass. Diagnostic mammogram showed two adjacent and nearly contiguous masses at the 2 o'clock position, 1.7cm, no right axillary adenopathy. Biopsy showed invasive mammary carcinoma, grade 2, HER-2 equivocal by IHC, negative by FISH, ER+ 95%, PR+ 90%, Ki67 2%.   09/19/2019 Genetic Testing   Negative genetic testing:  No pathogenic variants detected on the [facility] Breast Cancer STAT panel, Common Hereditary Cancers panel, or Melanoma panel. A variant of uncertain significance (VUS) was detected in the POLE gene called c.1357C>G. The report date is 09/19/2019.  UPDATE:  The POLE c.1357C>G VUS was reclassified to "likely benign" on 02/22/2020.  The STAT Breast cancer panel offered by [facility] includes sequencing and rearrangement analysis for the following 9 genes:  ATM, BRCA1, BRCA2, CDH1, CHEK2, PALB2, PTEN, STK11 and TP53. The Common Hereditary Cancers Panel offered by [facility] includes sequencing and/or deletion duplication testing of the following 48 genes: APC, ATM, AXIN2, BARD1, BMPR1A, BRCA1, BRCA2, BRIP1, CDH1, CDK4, CDKN2A (p14ARF), CDKN2A (p16INK4a), CHEK2, CTNNA1, DICER1, EPCAM (Deletion/duplication testing only), GREM1 (promoter region deletion/duplication testing only), KIT, MEN1, MLH1, MSH2, MSH3, MSH6, MUTYH, NBN, NF1, NHTL1, PALB2, PDGFRA, PMS2, POLD1, POLE, PTEN, RAD50, RAD51C, RAD51D, RNF43, SDHB, SDHC, SDHD, SMAD4, SMARCA4. STK11, TP53, TSC1, TSC2, and VHL.  The following genes were evaluated for sequence changes only: SDHA and HOXB13 c.251G>A variant only. The Melanoma panel offered by [facility] includes sequencing and/or deletion duplication testing of the following 9 genes: BAP1, BRCA2, BRIP1, CDK4, CDKN2A (p14ARF), CDKN2A (p16INK4a), POT1,  PTEN, RB1, and TP53.  The following gene was evaluated for sequence changes only: MITF (c.952G>A, p.Glu318Lys variant only).    09/27/2019 Surgery   Right lumpectomy ([facility]) ([id number]): Grade 2 IDC 1.5 cm with intermediate grade DCIS, margins negative, 4 lymph nodes negative, ER 95%, PR 90%, HER-2 negative, Ki-67 2%   10/04/2019 Cancer Staging   Staging form: Breast, AJCC 8th Edition - Pathologic stage from 10/04/2019: Stage IA (pT1c, pN0, cM0, G2, ER+, PR+, HER2-)    10/11/2019 Oncotype testing   The Oncotype DX score was 9 predicting a risk of outside the breast recurrence over the next 9 years of 3% if the patient's only systemic therapy is tamoxifen for 5 years.    11/07/2019 - 12/04/2019 Radiation Therapy   The patient initially received a dose of 40.05 Gy in 15 fractions to the breast using whole-breast tangent fields. This was delivered using a 3-D conformal technique. The pt received a boost delivering an additional 10 Gy in 5 fractions using a electron boost with 15meV electrons. The total dose was 50.05 Gy.   12/2019 - 12/2024 Anti-estrogen oral therapy   Anastrozole     INTERVAL HISTORY:  Ms. Jaso to review her survivorship care plan detailing her treatment course for breast cancer, as well as monitoring long-term side effects of that treatment, education regarding health maintenance, screening, and overall wellness and health promotion.     Overall, Ms. Asebedo reports feeling quite well.  She is taking Anastrozole and is tolerating this well.  She completed survivorship survey (scanned in) and notes that she has some mild breast swelling.  Otherwise she is feeling well and has no issues today.    REVIEW OF SYSTEMS:  Review of Systems  Constitutional: Negative for appetite change, chills, fatigue and fever.  HENT:     Negative for hearing loss, lump/mass and mouth sores.   Eyes: Negative for eye problems and icterus.  Respiratory: Negative for chest tightness, cough and  shortness of breath.   Cardiovascular: Negative for chest pain, leg swelling and palpitations.  Gastrointestinal: Negative for abdominal distention, abdominal pain, constipation, diarrhea, nausea and vomiting.  Endocrine: Negative for hot flashes.  Genitourinary: Negative for difficulty urinating.   Musculoskeletal: Negative for arthralgias.  Skin: Negative for itching and rash.  Neurological: Negative for dizziness, extremity weakness, headaches and numbness.  Hematological: Negative for adenopathy. Does not bruise/bleed easily.  Psychiatric/Behavioral: The patient is not nervous/anxious.   Breast: Denies any new nodularity, masses, tenderness, nipple changes, or nipple discharge.      ONCOLOGY TREATMENT TEAM:  1. Surgeon:  Dr. Ninfa Linden at [facility] 2. Medical Oncologist: Dr. Lindi Adie  3. Radiation Oncologist: Dr. Isidore Moos    PAST MEDICAL/SURGICAL HISTORY:  Past Medical History:  Diagnosis Date  . Asthma    several years ago  . Family history of breast cancer   . Family history of melanoma   . Family history of ovarian cancer   . History of melanoma   . Melanoma ([place])   . PONV (postoperative nausea and vomiting)   . Right middle lobe syndrome    Past Surgical History:  Procedure Laterality Date  . RADIOACTIVE SEED GUIDED PARTIAL MASTECTOMY WITH AXILLARY SENTINEL LYMPH NODE BIOPSY Right 09/27/2019   Procedure: RIGHT RADIOACTIVE SEED GUIDED LUMPECTOMY WITH  SENTINEL LYMPH NODE BIOPSY;  Surgeon: [name], MD;  Location: [facility];  Service: General;  Laterality: Right;  LMA  . TONSILLECTOMY    . TOTAL HIP ARTHROPLASTY  2003 and 2006   Bilateral     ALLERGIES:  No Known Allergies   CURRENT MEDICATIONS:  Outpatient Encounter Medications as of 02/28/2020  Medication Sig  . anastrozole (ARIMIDEX) 1 MG tablet Take 1 tablet (1 mg total) by mouth daily.  . B Complex-C (SUPER B COMPLEX) TABS Take 1 tablet by mouth daily.  . Calcium Carbonate-Vitamin D3  (CALCIUM 600/VITAMIN D) 600-400 MG-UNIT TABS Take 2 tablets by mouth daily.  . Cholecalciferol (VITAMIN D) 50 MCG (2000 UT) tablet Take 2,000 Units by mouth daily.   . Hypromellose (SYSTANE OVERNIGHT THERAPY OP) Place 1 drop into both eyes at bedtime.  . Multiple Vitamins-Minerals (MULTI COMPLETE/IRON PO) Take 1 tablet by mouth daily.  . Turmeric 500 MG CAPS Take 500 mg by mouth daily.    No facility-administered encounter medications on file as of 02/28/2020.     ONCOLOGIC FAMILY HISTORY:  Family History  Problem Relation Age of Onset  . Heart disease Father   . Melanoma Father        dx. 36s  . Breast cancer Mother        bilateral, dx. mid-50s  . Melanoma Mother        dx. 29s  . Melanoma Sister        dx. 24s  . Breast cancer Maternal Aunt        dx. >50  . Ovarian cancer Maternal Aunt        dx. >50     GENETIC COUNSELING/TESTING: See above  SOCIAL HISTORY:  Social History   Socioeconomic History  . Marital status: Widowed    Spouse name: Not on file  . Number of children: 2  . Years of education: Not on file  . Highest education level: Not on file  Occupational History  . Occupation: Retired  Tobacco Use  .  Smoking status: Never Smoker  . Smokeless tobacco: Never Used  Vaping Use  . Vaping Use: Never used  Substance and Sexual Activity  . Alcohol use: Yes    Alcohol/week: 3.0 - 4.0 standard drinks    Types: 3 - 4 Glasses of wine per week    Comment: Socially  . Drug use: No  . Sexual activity: Not on file  Other Topics Concern  . Not on file  Social History Narrative  . Not on file   Social Determinants of Health   Financial Resource Strain:   . Difficulty of Paying Living Expenses:   Food Insecurity:   . Worried About Running Out of Food in the Last Year:   . Ran Out of Food in the Last Year:   Transportation Needs:   . Lack of Transportation (Medical):   . Lack of Transportation (Non-Medical):   Physical Activity:   . Days of Exercise per  Week:   . Minutes of Exercise per Session:   Stress:   . Feeling of Stress :   Social Connections:   . Frequency of Communication with Friends and Family:   . Frequency of Social Gatherings with Friends and Family:   . Attends Religious Services:   . Active Member of Clubs or Organizations:   . Attends Club or Organization Meetings:   . Marital Status:   Intimate Partner Violence:   . Fear of Current or Ex-Partner:   . Emotionally Abused:   . Physically Abused:   . Sexually Abused:      OBSERVATIONS/OBJECTIVE:  BP (!) 111/57 Comment: takeing 1 minute at resting.  Pulse 79 Comment: Taking 1 minute at resting.  Temp 98.3 F (36.8 C) (Temporal)   Resp 17   Ht 5' 4" (1.626 m)   Wt 146 lb 6.4 oz (66.4 kg)   SpO2 98%   BMI 25.13 kg/m  GENERAL: Patient is a well appearing female in no acute distress HEENT:  Sclerae anicteric.  Oropharynx clear and moist. No ulcerations or evidence of oropharyngeal candidiasis. Neck is supple.  NODES:  No cervical, supraclavicular, or axillary lymphadenopathy palpated.  BREAST EXAM:  Very mild swelling in right breast no sign of local recurrence LUNGS:  Clear to auscultation bilaterally.  No wheezes or rhonchi. HEART:  Regular rate and rhythm. No murmur appreciated. ABDOMEN:  Soft, nontender.  Positive, normoactive bowel sounds. No organomegaly palpated. MSK:  No focal spinal tenderness to palpation. Full range of motion bilaterally in the upper extremities. EXTREMITIES:  No peripheral edema.   SKIN:  Clear with no obvious rashes or skin changes. No nail dyscrasia. NEURO:  Nonfocal. Well oriented.  Appropriate affect.    LABORATORY DATA:  None for this visit.  DIAGNOSTIC IMAGING:  None for this visit.      ASSESSMENT AND PLAN:  Ms.. Shafran is a pleasant 68 y.o. female with Stage IA right breast invasive ductal carcinoma, ER+/PR+/HER2-, diagnosed in 09/2019, treated with lumpectomy, adjuvant radiation therapy, and anti-estrogen therapy  with Anastrozole beginning in 12/2019.  She presents to the Survivorship Clinic for our initial meeting and routine follow-up post-completion of treatment for breast cancer.    1. Stage IA right breast cancer:  Ms. Saenz is continuing to recover from definitive treatment for breast cancer. She will follow-up with her medical oncologist, Dr. Gudena in 7 months with history and physical exam per surveillance protocol.  She will continue her anti-estrogen therapy with Anastrozole. Thus far, she is tolerating the Anastrozole well, with minimal side   effects. She was instructed to make Dr. Lindi Adie or myself aware if she begins to experience any worsening side effects of the medication and I could see her back in clinic to help manage those side effects, as needed. Her mammogram is due 08/2020; orders placed today.   Today, a comprehensive survivorship care plan and treatment summary was reviewed with the patient today detailing her breast cancer diagnosis, treatment course, potential late/long-term effects of treatment, appropriate follow-up care with recommendations for the future, and patient education resources.  A copy of this summary, along with a letter will be sent to the patient's primary care provider via mail/fax/In Basket message after today's visit.    2. Bone health:  Given Ms. Vigil's age/history of breast cancer and her current treatment regimen including anti-estrogen therapy with Anastrozole, she is at risk for bone demineralization.  Her last DEXA scan was 08/2019 at [facility] and showed osteopenia with a T score of -1.7 in the forearm.  She will need to have this repeated in 08/2021.  In the meantime, she was encouraged to increase her consumption of foods rich in calcium, as well as increase her weight-bearing activities.  She was given education on specific activities to promote bone health.  3. Cancer screening:  Due to Ms. Crevier's history and her age, she should receive  screening for skin cancers, colon cancer, and gynecologic cancers.  The information and recommendations are listed on the patient's comprehensive care plan/treatment summary and were reviewed in detail with the patient.    4. Health maintenance and wellness promotion: Ms. Mkrtchyan was encouraged to consume 5-7 servings of fruits and vegetables per day. We reviewed the "Nutrition Rainbow" handout, as well as the handout "Take Control of Your Health and Reduce Your Cancer Risk" from the [employer].  She was also encouraged to engage in moderate to vigorous exercise for 30 minutes per day most days of the week. We discussed the [employer] fitness program, which is designed for cancer survivors to help them become more physically fit after cancer treatments.  She was instructed to limit her alcohol consumption and continue to abstain from tobacco use.     5. Support services/counseling: It is not uncommon for this period of the patient's cancer care trajectory to be one of many emotions and stressors.  We discussed how this can be increasingly difficult during the times of quarantine and social distancing due to the COVID-19 pandemic.   She was given information regarding our available services and encouraged to contact me with any questions or for help enrolling in any of our support group/programs.    Follow up instructions:    -Return to cancer center in 09/2020 for f/u with Dr. Lindi Adie -Mammogram due in 08/2020 -Follow up with Dr. Ninfa Linden in 03/2020 -Bone density in 2023 (report is pending fax) -She is welcome to return back to the Survivorship Clinic at any time; no additional follow-up needed at this time.  -Consider referral back to survivorship as a long-term survivor for continued surveillance  The patient was provided an opportunity to ask questions and all were answered. The patient agreed with the plan and demonstrated an understanding of the instructions.   Total encounter  time: 45 minutes*  Wilber Bihari, NP 02/29/20 10:35 AM Medical Oncology and Hematology [facility] [place], [place] [address] Tel. [phone]    Fax. [phone]  *Total Encounter Time as defined by the Centers for Medicare and Medicaid Services includes,  in addition to the face-to-face time of a patient visit (documented in the note above) non-face-to-face time: obtaining and reviewing outside history, ordering and reviewing medications, tests or procedures, care coordination (communications with other health care professionals or caregivers) and documentation in the medical record.   

## 2020-02-27 NOTE — Telephone Encounter (Signed)
Upbeat study - Called and spoke to patient on the phone today to remind her of her apt for the Upbeat study on Wed 02/28/20.  I reminded the patient that she needs to be fasting for 3 hours prior to her labs being drawn at 8:00 am.  She will then go to MRI for her scan and then return her to finish up all her other 3 month assessments before seeing Ria Comment.  I reminded her to bring a snack after her labs are drawn and to wear comfortable clothing and shoes for the other assessments. Remer Macho 02/27/20 - 10:35 am

## 2020-02-28 ENCOUNTER — Other Ambulatory Visit: Payer: Self-pay

## 2020-02-28 ENCOUNTER — Inpatient Hospital Stay: Payer: Medicare PPO

## 2020-02-28 ENCOUNTER — Inpatient Hospital Stay: Payer: Medicare PPO | Attending: Hematology and Oncology | Admitting: Adult Health

## 2020-02-28 ENCOUNTER — Ambulatory Visit (HOSPITAL_COMMUNITY)
Admission: RE | Admit: 2020-02-28 | Discharge: 2020-02-28 | Disposition: A | Discharge status: Home / Self Care | Payer: Self-pay | Source: Ambulatory Visit | Attending: Hematology and Oncology | Admitting: Hematology and Oncology

## 2020-02-28 ENCOUNTER — Encounter: Payer: Self-pay | Admitting: Adult Health

## 2020-02-28 VITALS — BP 111/57 | HR 79 | Temp 98.3°F | Resp 17 | Ht 64.0 in | Wt 146.4 lb

## 2020-02-28 DIAGNOSIS — Z808 Family history of malignant neoplasm of other organs or systems: Secondary | ICD-10-CM | POA: Diagnosis not present

## 2020-02-28 DIAGNOSIS — Z8041 Family history of malignant neoplasm of ovary: Secondary | ICD-10-CM | POA: Insufficient documentation

## 2020-02-28 DIAGNOSIS — C50211 Malignant neoplasm of upper-inner quadrant of right female breast: Secondary | ICD-10-CM | POA: Diagnosis not present

## 2020-02-28 DIAGNOSIS — Z803 Family history of malignant neoplasm of breast: Secondary | ICD-10-CM | POA: Diagnosis not present

## 2020-02-28 DIAGNOSIS — Z79811 Long term (current) use of aromatase inhibitors: Secondary | ICD-10-CM | POA: Insufficient documentation

## 2020-02-28 DIAGNOSIS — Z006 Encounter for examination for normal comparison and control in clinical research program: Secondary | ICD-10-CM | POA: Insufficient documentation

## 2020-02-28 DIAGNOSIS — Z170 Estrogen receptor positive status [ER+]: Secondary | ICD-10-CM | POA: Diagnosis not present

## 2020-02-28 DIAGNOSIS — Z923 Personal history of irradiation: Secondary | ICD-10-CM | POA: Diagnosis not present

## 2020-02-28 DIAGNOSIS — M8580 Other specified disorders of bone density and structure, unspecified site: Secondary | ICD-10-CM | POA: Insufficient documentation

## 2020-02-28 DIAGNOSIS — J45909 Unspecified asthma, uncomplicated: Secondary | ICD-10-CM | POA: Insufficient documentation

## 2020-02-28 LAB — RESEARCH LABS

## 2020-02-28 NOTE — Research (Signed)
UPBEAT [id number] - UNDERSTANDING and PREDICTING BREAST CANCER EVENTS AFTER TREATMENT  02/28/2020 0800AM  Ms. Catherine Long arrives today to complete her 52-month visit for the [id number] UPBEAT study.  QUESTIONNAIRES: The 45-month self-administered questionnaire, Covid-19 questionnaire and [place] Cardiomyopathy questionnaire for UPBEAT have all been completed via email (verified in [place]). The cardiovascular event form is completed in person today: [name] denies any hospitalizations or cardiac events since her last visit.  LABS: Mandatory and optional labs are collected (per consent) via peripheral stick, following the study protocol. Rane tolerated lab collection without complaint.  MED REVIEW: Following lab collection, [name] was given an opportunity to review her current medication list with one change noted: - She is currently taking ascorbic acid 1000mg  PO daily.  She states she did not take her vitamin C while receiving radiation treatment but started taking it again on 12/06/2019 (RT ended 12/04/2019). This change is added to her existing medication list.  NEUROCOGNITIVE TESTING: Neurocognitive tests are performed per protocol by trained clinical research coordinator Carol Ada without issue or complaint.  CARDIAC MRI: Following neurocognitive testing, Stacia is escorted to the radiology department for completion of her cardiac MRI per protocol: she tolerated the procedure without complaint.   PHYSICAL ASSESSMENT: Waist circumference is measured and all walking, range of motion, strength and balance tests are completed without difficulty or complaint.  VITAL SIGNS: Height, weight, and vital signs are collected by the nurse techs prior to [name]'s visit with Mendel Ryder. Vital signs are collected after being seated comfortably for five minutes, with a one-minute interval between B/P readings per study protocol.  GIFT CARD/TOTE BAG: Following completion of above activities, Tuere receives a $25 gift card  and a tote bag per protocol for her time and participation in the UPBEAT study.  [name] is thanked for her time today and continued participation in the UPBEAT study. A business card with my direct contact information is provided and she is encouraged to contact clinical research nurse Doristine Johns or myself for any needs or questions: she verbalizes understanding.  Catherine Long. Sharlett Iles, BSN, RN, CIC 02/28/2020 11:44 AM

## 2020-02-29 ENCOUNTER — Telehealth: Payer: Self-pay | Admitting: Adult Health

## 2020-02-29 ENCOUNTER — Telehealth: Payer: Self-pay | Admitting: Hematology and Oncology

## 2020-02-29 NOTE — Telephone Encounter (Signed)
Scheduled appts per 7/28 los. Pt confirmed appt date and time.

## 2020-03-02 NOTE — Telephone Encounter (Signed)
error 

## 2020-03-11 DIAGNOSIS — C50911 Malignant neoplasm of unspecified site of right female breast: Secondary | ICD-10-CM | POA: Diagnosis not present

## 2020-04-05 DIAGNOSIS — E663 Overweight: Secondary | ICD-10-CM | POA: Diagnosis not present

## 2020-04-05 DIAGNOSIS — Z79811 Long term (current) use of aromatase inhibitors: Secondary | ICD-10-CM | POA: Diagnosis not present

## 2020-04-05 DIAGNOSIS — C50911 Malignant neoplasm of unspecified site of right female breast: Secondary | ICD-10-CM | POA: Diagnosis not present

## 2020-04-05 DIAGNOSIS — Z8249 Family history of ischemic heart disease and other diseases of the circulatory system: Secondary | ICD-10-CM | POA: Diagnosis not present

## 2020-04-05 DIAGNOSIS — Z803 Family history of malignant neoplasm of breast: Secondary | ICD-10-CM | POA: Diagnosis not present

## 2020-04-05 DIAGNOSIS — R030 Elevated blood-pressure reading, without diagnosis of hypertension: Secondary | ICD-10-CM | POA: Diagnosis not present

## 2020-04-05 DIAGNOSIS — Z833 Family history of diabetes mellitus: Secondary | ICD-10-CM | POA: Diagnosis not present

## 2020-04-05 DIAGNOSIS — Z85820 Personal history of malignant melanoma of skin: Secondary | ICD-10-CM | POA: Diagnosis not present

## 2020-04-05 DIAGNOSIS — Z6826 Body mass index (BMI) 26.0-26.9, adult: Secondary | ICD-10-CM | POA: Diagnosis not present

## 2020-04-29 ENCOUNTER — Ambulatory Visit: Payer: Medicare PPO | Attending: Hematology and Oncology

## 2020-04-29 ENCOUNTER — Other Ambulatory Visit: Payer: Self-pay

## 2020-04-29 DIAGNOSIS — Z483 Aftercare following surgery for neoplasm: Secondary | ICD-10-CM | POA: Insufficient documentation

## 2020-04-29 NOTE — Therapy (Signed)
[place], [place], [address] Phone: [phone]   Fax:  [phone]  Physical Therapy Treatment  Patient Details  Name: Catherine Long MRN: [id number] Date of Birth: Jul 13, 1951 Referring Provider (PT): Dr. [name]   Encounter Date: 04/29/2020   PT End of Session - 04/29/20 0852    Visit Number 3   # unchanged due to screen only   Number of Visits 2    Date for PT Re-Evaluation 11/08/19    PT Start Time 0845    PT Stop Time 0853    PT Time Calculation (min) 8 min    Activity Tolerance Patient tolerated treatment well    Behavior During Therapy [facility] for tasks assessed/performed           Past Medical History:  Diagnosis Date  . Asthma    several years ago  . Family history of breast cancer   . Family history of melanoma   . Family history of ovarian cancer   . History of melanoma   . Melanoma ([place])   . PONV (postoperative nausea and vomiting)   . Right middle lobe syndrome     Past Surgical History:  Procedure Laterality Date  . RADIOACTIVE SEED GUIDED PARTIAL MASTECTOMY WITH AXILLARY SENTINEL LYMPH NODE BIOPSY Right 09/27/2019   Procedure: RIGHT RADIOACTIVE SEED GUIDED LUMPECTOMY WITH  SENTINEL LYMPH NODE BIOPSY;  Surgeon: [name], MD;  Location: [facility];  Service: General;  Laterality: Right;  LMA  . TONSILLECTOMY    . TOTAL HIP ARTHROPLASTY  2003 and 2006   Bilateral    There were no vitals filed for this visit.   Subjective Assessment - 04/29/20 0847    Subjective Pt here for 3 month L-dex  check up    Pertinent History Patient was diagnosed on 08/14/2019 with right grade II invasive ductal carcinoma breast cancer. Patient underwent a right lumpectomy and sentinel node biopsy (4 negative nodes removed) on 09/27/2019. It is ER/PR positive and HER2 negative with a Ki67 of 2%. She has had bilateral hip replacements, right in 2003 and left in 2006. She also has a  history of right arm melanoma in 1975.                  L-DEX FLOWSHEETS - 04/29/20 0800      L-DEX LYMPHEDEMA SCREENING   BASELINE SCORE (UNILATERAL) 2.1    L-DEX SCORE (UNILATERAL) 2.3    VALUE CHANGE (UNILAT) 0.2                                  PT Long Term Goals - 10/19/19 1249      PT LONG TERM GOAL #1   Title Patient will demonstrate she has regained full shoulder ROM and function post operatively compared to baselines.    Time 8    Period Weeks    Status Achieved                 Plan - 04/29/20 5686    Clinical Impression Statement Pt returns for 3 month L-Dex screen. Her score was within normal range with a change of 0.2 from baseline. No further treatment required at this time except to return for 3 month L-Dex screens.    PT Next Visit Plan Continue every 3 month L-Dex screens for up to 2 years from surgery.    Consulted and Agree with Plan  of Care Patient           Patient will benefit from skilled therapeutic intervention in order to improve the following deficits and impairments:     Visit Diagnosis: Aftercare following surgery for neoplasm     Problem List Patient Active Problem List   Diagnosis Date Noted  . Genetic testing 09/20/2019  . History of melanoma   . Family history of breast cancer   . Family history of ovarian cancer   . Family history of melanoma   . Malignant neoplasm of upper-inner quadrant of right breast in female, estrogen receptor positive ([place]) 09/07/2019  . Osteopenia 05/11/2017  . Asthma, chronic, resolved 04/26/2012  . Right middle lobe syndrome 04/26/2012    Otelia Limes, PTA 04/29/2020, 8:57 AM   [place] [place], [place], [address] Phone: [phone]   Fax:  [phone]  Name: Geniene List MRN: [id number] Date of Birth: 1950-09-10

## 2020-05-06 ENCOUNTER — Other Ambulatory Visit: Payer: Self-pay

## 2020-05-06 ENCOUNTER — Other Ambulatory Visit: Payer: Medicare PPO

## 2020-05-06 DIAGNOSIS — Z0000 Encounter for general adult medical examination without abnormal findings: Secondary | ICD-10-CM | POA: Diagnosis not present

## 2020-05-06 DIAGNOSIS — Z136 Encounter for screening for cardiovascular disorders: Secondary | ICD-10-CM | POA: Diagnosis not present

## 2020-05-06 DIAGNOSIS — Z13220 Encounter for screening for lipoid disorders: Secondary | ICD-10-CM | POA: Diagnosis not present

## 2020-05-06 LAB — COMPLETE METABOLIC PANEL WITH GFR
AG Ratio: 1.6 (calc) (ref 1.0–2.5)
ALT: 12 U/L (ref 6–29)
AST: 16 U/L (ref 10–35)
Albumin: 4.1 g/dL (ref 3.6–5.1)
Alkaline phosphatase (APISO): 58 U/L (ref 37–153)
BUN: 11 mg/dL (ref 7–25)
CO2: 28 mmol/L (ref 20–32)
Calcium: 9.6 mg/dL (ref 8.6–10.4)
Chloride: 103 mmol/L (ref 98–110)
Creat: 0.75 mg/dL (ref 0.50–0.99)
GFR, Est African American: 94 mL/min/{1.73_m2} (ref >=60)
GFR, Est Non African American: 81 mL/min/{1.73_m2} (ref >=60)
Globulin: 2.6 g/dL (calc) (ref 1.9–3.7)
Glucose, Bld: 88 mg/dL (ref 65–99)
Potassium: 4.1 mmol/L (ref 3.5–5.3)
Sodium: 140 mmol/L (ref 135–146)
Total Bilirubin: 0.6 mg/dL (ref 0.2–1.2)
Total Protein: 6.7 g/dL (ref 6.1–8.1)

## 2020-05-06 LAB — LIPID PANEL
Cholesterol: 147 mg/dL (ref <200)
HDL: 67 mg/dL (ref >=50)
LDL Cholesterol (Calc): 61 mg/dL (calc)
Non-HDL Cholesterol (Calc): 80 mg/dL (calc) (ref <130)
Total CHOL/HDL Ratio: 2.2 (calc) (ref <5.0)
Triglycerides: 102 mg/dL (ref <150)

## 2020-05-06 LAB — CBC WITH DIFFERENTIAL/PLATELET
Absolute Monocytes: 380 cells/uL (ref 200–950)
Basophils Absolute: 62 cells/uL (ref 0–200)
Basophils Relative: 0.9 %
Eosinophils Absolute: 97 cells/uL (ref 15–500)
Eosinophils Relative: 1.4 %
HCT: 43.5 % (ref 35.0–45.0)
Hemoglobin: 14.1 g/dL (ref 11.7–15.5)
Lymphs Abs: 1456 cells/uL (ref 850–3900)
MCH: 29 pg (ref 27.0–33.0)
MCHC: 32.4 g/dL (ref 32.0–36.0)
MCV: 89.5 fL (ref 80.0–100.0)
MPV: 9.6 fL (ref 7.5–12.5)
Monocytes Relative: 5.5 %
Neutro Abs: 4906 cells/uL (ref 1500–7800)
Neutrophils Relative %: 71.1 %
Platelets: 288 10*3/uL (ref 140–400)
RBC: 4.86 10*6/uL (ref 3.80–5.10)
RDW: 13.3 % (ref 11.0–15.0)
Total Lymphocyte: 21.1 %
WBC: 6.9 10*3/uL (ref 3.8–10.8)

## 2020-05-14 ENCOUNTER — Ambulatory Visit (INDEPENDENT_AMBULATORY_CARE_PROVIDER_SITE_OTHER): Payer: Medicare PPO | Admitting: Family Medicine

## 2020-05-14 ENCOUNTER — Other Ambulatory Visit: Payer: Self-pay

## 2020-05-14 VITALS — BP 120/70 | HR 92 | Temp 97.4°F | Ht 64.0 in | Wt 146.0 lb

## 2020-05-14 DIAGNOSIS — Z853 Personal history of malignant neoplasm of breast: Secondary | ICD-10-CM | POA: Diagnosis not present

## 2020-05-14 DIAGNOSIS — Z85820 Personal history of malignant melanoma of skin: Secondary | ICD-10-CM

## 2020-05-14 DIAGNOSIS — Z23 Encounter for immunization: Secondary | ICD-10-CM | POA: Diagnosis not present

## 2020-05-14 DIAGNOSIS — Z0000 Encounter for general adult medical examination without abnormal findings: Secondary | ICD-10-CM

## 2020-05-14 NOTE — Progress Notes (Signed)
Subjective:    Patient ID: Catherine Long, female    DOB: 13-Apr-1951, 69 y.o.   MRN: [id number]  HPI Patient is a very sweet 69 year old Caucasian female here today for complete physical exam.  Since I last saw the patient, unfortunately she was diagnosed with breast cancer.  She underwent a lumpectomy with clear margins.  Lymph nodes were negative.  Patient also underwent localized radiation therapy and is currently on anastrozole for hormonal suppression.  Prognosis is extremely good.  Overall she is doing well however she has noticed some thinning hair since she started anastrozole.  She sees a gynecologist who performs her pelvic exam.  They also perform her bone density test and this was just done last year.  She is currently on calcium and vitamin D related to some osteopenia related to this.  Her immunization records are listed below.  She denies any falls, depression, or memory loss. Immunization History  Administered Date(s) Administered  . Fluad Quad(high Dose 65+) 06/20/2019  . Influenza Split 04/26/2012  . Influenza, High Dose Seasonal PF 05/20/2017, 05/23/2018  . Influenza,inj,Quad PF,6+ Mos 06/01/2013, 05/28/2014, 05/15/2016  . PFIZER SARS-COV-2 Vaccination 09/10/2019, 10/05/2019  . Pneumococcal Conjugate-13 05/15/2016  . Pneumococcal Polysaccharide-23 04/26/2012, 05/23/2018  . Zoster 05/30/2014  . Zoster Recombinat (Shingrix) 10/05/2017, 12/26/2017   Her most recent lab work as listed below and is excellent: Lab on 05/06/2020  Component Date Value Ref Range Status  . WBC 05/06/2020 6.9  3.8 - 10.8 Thousand/uL Final  . RBC 05/06/2020 4.86  3.80 - 5.10 Million/uL Final  . Hemoglobin 05/06/2020 14.1  11.7 - 15.5 g/dL Final  . HCT 05/06/2020 43.5  35 - 45 % Final  . MCV 05/06/2020 89.5  80.0 - 100.0 fL Final  . MCH 05/06/2020 29.0  27.0 - 33.0 pg Final  . MCHC 05/06/2020 32.4  32.0 - 36.0 g/dL Final  . RDW 05/06/2020 13.3  11.0 - 15.0 % Final  . Platelets 05/06/2020  288  140 - 400 Thousand/uL Final  . MPV 05/06/2020 9.6  7.5 - 12.5 fL Final  . Neutro Abs 05/06/2020 4,906  1,500 - 7,800 cells/uL Final  . Lymphs Abs 05/06/2020 1,456  850 - 3,900 cells/uL Final  . Absolute Monocytes 05/06/2020 380  200 - 950 cells/uL Final  . Eosinophils Absolute 05/06/2020 97  15 - 500 cells/uL Final  . Basophils Absolute 05/06/2020 62  0 - 200 cells/uL Final  . Neutrophils Relative % 05/06/2020 71.1  % Final  . Total Lymphocyte 05/06/2020 21.1  % Final  . Monocytes Relative 05/06/2020 5.5  % Final  . Eosinophils Relative 05/06/2020 1.4  % Final  . Basophils Relative 05/06/2020 0.9  % Final  . Glucose, Bld 05/06/2020 88  65 - 99 mg/dL Final   Comment: .            Fasting reference interval .   . BUN 05/06/2020 11  7 - 25 mg/dL Final  . Creat 05/06/2020 0.75  0.50 - 0.99 mg/dL Final   Comment: For patients >19 years of age, the reference limit for Creatinine is approximately 13% higher for people identified as African-American. .   . GFR, Est Non African American 05/06/2020 81  > OR = 60 mL/min/1.68m2 Final  . GFR, Est African American 05/06/2020 94  > OR = 60 mL/min/1.51m2 Final  . BUN/Creatinine Ratio 41/96/2229 NOT APPLICABLE  6 - 22 (calc) Final  . Sodium 05/06/2020 140  135 - 146 mmol/L Final  . Potassium  05/06/2020 4.1  3.5 - 5.3 mmol/L Final  . Chloride 05/06/2020 103  98 - 110 mmol/L Final  . CO2 05/06/2020 28  20 - 32 mmol/L Final  . Calcium 05/06/2020 9.6  8.6 - 10.4 mg/dL Final  . Total Protein 05/06/2020 6.7  6.1 - 8.1 g/dL Final  . Albumin 05/06/2020 4.1  3.6 - 5.1 g/dL Final  . Globulin 05/06/2020 2.6  1.9 - 3.7 g/dL (calc) Final  . AG Ratio 05/06/2020 1.6  1.0 - 2.5 (calc) Final  . Total Bilirubin 05/06/2020 0.6  0.2 - 1.2 mg/dL Final  . Alkaline phosphatase (APISO) 05/06/2020 58  37 - 153 U/L Final  . AST 05/06/2020 16  10 - 35 U/L Final  . ALT 05/06/2020 12  6 - 29 U/L Final  . Cholesterol 05/06/2020 147  <200 mg/dL Final  . HDL 05/06/2020  67  > OR = 50 mg/dL Final  . Triglycerides 05/06/2020 102  <150 mg/dL Final  . LDL Cholesterol (Calc) 05/06/2020 61  mg/dL (calc) Final   Comment: Reference range: <100 . Desirable range <100 mg/dL for primary prevention;   <70 mg/dL for patients with CHD or diabetic patients  with > or = 2 CHD risk factors. Marland Kitchen LDL-C is now calculated using the Martin-Hopkins  calculation, which is a validated novel method providing  better accuracy than the Friedewald equation in the  estimation of LDL-C.  Cresenciano Genre et al. Annamaria Helling. 8341;962(22): 2061-2068  (http://education.[employer].com/faq/FAQ164)   . Total CHOL/HDL Ratio 05/06/2020 2.2  <5.0 (calc) Final  . Non-HDL Cholesterol (Calc) 05/06/2020 80  <130 mg/dL (calc) Final   Comment: For patients with diabetes plus 1 major ASCVD risk  factor, treating to a non-HDL-C goal of <100 mg/dL  (LDL-C of <70 mg/dL) is considered a therapeutic  option.     Past Medical History:  Diagnosis Date  . Asthma    several years ago  . Cancer ([place])    Phreesia 05/11/2020  . Family history of breast cancer   . Family history of melanoma   . Family history of ovarian cancer   . History of melanoma   . Melanoma ([place])   . PONV (postoperative nausea and vomiting)   . Right middle lobe syndrome    Past Surgical History:  Procedure Laterality Date  . BREAST SURGERY N/A    Phreesia 05/11/2020  . JOINT REPLACEMENT N/A    Phreesia 05/11/2020  . RADIOACTIVE SEED GUIDED PARTIAL MASTECTOMY WITH AXILLARY SENTINEL LYMPH NODE BIOPSY Right 09/27/2019   Procedure: RIGHT RADIOACTIVE SEED GUIDED LUMPECTOMY WITH  SENTINEL LYMPH NODE BIOPSY;  Surgeon: [name], MD;  Location: [place];  Service: General;  Laterality: Right;  LMA  . TONSILLECTOMY    . TOTAL HIP ARTHROPLASTY  2003 and 2006   Bilateral   Current Outpatient Medications on File Prior to Visit  Medication Sig Dispense Refill  . anastrozole (ARIMIDEX) 1 MG tablet Take 1 tablet (1 mg total) by mouth  daily. 90 tablet 3  . ascorbic acid (VITAMIN C) 1000 MG tablet Take 1,000 mg by mouth daily.    . B Complex-C (SUPER B COMPLEX) TABS Take 1 tablet by mouth daily.    . Calcium Carbonate-Vitamin D3 (CALCIUM 600/VITAMIN D) 600-400 MG-UNIT TABS Take 2 tablets by mouth daily.    . Cholecalciferol (VITAMIN D) 50 MCG (2000 UT) tablet Take 2,000 Units by mouth daily.     . Multiple Vitamins-Minerals (MULTI COMPLETE/IRON PO) Take 1 tablet by mouth daily.    Marland Kitchen  Turmeric 500 MG CAPS Take 500 mg by mouth daily.      No current facility-administered medications on file prior to visit.   No Known Allergies Social History   Socioeconomic History  . Marital status: Widowed    Spouse name: Not on file  . Number of children: 2  . Years of education: Not on file  . Highest education level: Not on file  Occupational History  . Occupation: Retired  Tobacco Use  . Smoking status: Never Smoker  . Smokeless tobacco: Never Used  Vaping Use  . Vaping Use: Never used  Substance and Sexual Activity  . Alcohol use: Yes    Alcohol/week: 3.0 - 4.0 standard drinks    Types: 3 - 4 Glasses of wine per week    Comment: Socially  . Drug use: No  . Sexual activity: Not on file  Other Topics Concern  . Not on file  Social History Narrative  . Not on file   Social Determinants of Health   Financial Resource Strain:   . Difficulty of Paying Living Expenses: Not on file  Food Insecurity:   . Worried About Charity fundraiser in the Last Year: Not on file  . Ran Out of Food in the Last Year: Not on file  Transportation Needs:   . Lack of Transportation (Medical): Not on file  . Lack of Transportation (Non-Medical): Not on file  Physical Activity:   . Days of Exercise per Week: Not on file  . Minutes of Exercise per Session: Not on file  Stress:   . Feeling of Stress : Not on file  Social Connections:   . Frequency of Communication with Friends and Family: Not on file  . Frequency of Social Gatherings  with Friends and Family: Not on file  . Attends Religious Services: Not on file  . Active Member of Clubs or Organizations: Not on file  . Attends Archivist Meetings: Not on file  . Marital Status: Not on file  Intimate Partner Violence:   . Fear of Current or Ex-Partner: Not on file  . Emotionally Abused: Not on file  . Physically Abused: Not on file  . Sexually Abused: Not on file   Family History  Problem Relation Age of Onset  . Heart disease Father   . Melanoma Father        dx. 39s  . Breast cancer Mother        bilateral, dx. mid-50s  . Melanoma Mother        dx. 31s  . Melanoma Sister        dx. 10s  . Breast cancer Maternal Aunt        dx. >50  . Ovarian cancer Maternal Aunt        dx. >50      Review of Systems  All other systems reviewed and are negative.      Objective:   Physical Exam Vitals reviewed.  Constitutional:      General: She is not in acute distress.    Appearance: She is well-developed. She is not diaphoretic.  HENT:     Head: Normocephalic and atraumatic.     Right Ear: External ear normal.     Left Ear: External ear normal.     Nose: Nose normal.     Mouth/Throat:     Pharynx: No oropharyngeal exudate.  Eyes:     General: No scleral icterus.       Right eye:  No discharge.        Left eye: No discharge.     Conjunctiva/sclera: Conjunctivae normal.     Pupils: Pupils are equal, round, and reactive to light.  Neck:     Thyroid: No thyromegaly.     Vascular: No JVD.     Trachea: No tracheal deviation.  Cardiovascular:     Rate and Rhythm: Normal rate and regular rhythm.     Heart sounds: Normal heart sounds. No murmur heard.  No friction rub. No gallop.   Pulmonary:     Effort: Pulmonary effort is normal. No respiratory distress.     Breath sounds: Normal breath sounds. No stridor. No wheezing or rales.  Chest:     Chest wall: No tenderness.  Abdominal:     General: Bowel sounds are normal. There is no distension.      Palpations: Abdomen is soft. There is no mass.     Tenderness: There is no abdominal tenderness. There is no guarding or rebound.  Musculoskeletal:        General: No tenderness. Normal range of motion.     Cervical back: Normal range of motion and neck supple.  Lymphadenopathy:     Cervical: No cervical adenopathy.  Skin:    General: Skin is warm.     Coloration: Skin is not pale.     Findings: No erythema or rash.  Neurological:     Mental Status: She is alert and oriented to person, place, and time.     Cranial Nerves: No cranial nerve deficit.     Motor: No abnormal muscle tone.     Coordination: Coordination normal.     Deep Tendon Reflexes: Reflexes are normal and symmetric.  Psychiatric:        Behavior: Behavior normal.        Thought Content: Thought content normal.        Judgment: Judgment normal.           Assessment & Plan:  Routine general medical examination at a health care facility  History of melanoma  History of breast cancer  Her physical exam today is completely normal.  Blood pressure is excellent.  She received her flu shot.  The remainder of her immunizations are up-to-date.  Colonoscopy is due again in 2026.  Mammogram will be performed this summer at her oncologist.  Bone density was just recently performed at her gynecologist.  Pelvic exam was performed at her gynecologist.  Labs today are outstanding.  She denies any falls depression or memory loss.  The remainder of her preventative care is up-to-date.  Regular anticipatory guidance was provided

## 2020-05-15 DIAGNOSIS — H2513 Age-related nuclear cataract, bilateral: Secondary | ICD-10-CM | POA: Diagnosis not present

## 2020-05-15 DIAGNOSIS — H43813 Vitreous degeneration, bilateral: Secondary | ICD-10-CM | POA: Diagnosis not present

## 2020-05-15 DIAGNOSIS — H04123 Dry eye syndrome of bilateral lacrimal glands: Secondary | ICD-10-CM | POA: Diagnosis not present

## 2020-05-21 ENCOUNTER — Encounter (HOSPITAL_COMMUNITY): Payer: Self-pay

## 2020-07-01 DIAGNOSIS — L570 Actinic keratosis: Secondary | ICD-10-CM | POA: Diagnosis not present

## 2020-07-01 DIAGNOSIS — D2272 Melanocytic nevi of left lower limb, including hip: Secondary | ICD-10-CM | POA: Diagnosis not present

## 2020-07-01 DIAGNOSIS — D485 Neoplasm of uncertain behavior of skin: Secondary | ICD-10-CM | POA: Diagnosis not present

## 2020-07-01 DIAGNOSIS — L814 Other melanin hyperpigmentation: Secondary | ICD-10-CM | POA: Diagnosis not present

## 2020-07-01 DIAGNOSIS — L821 Other seborrheic keratosis: Secondary | ICD-10-CM | POA: Diagnosis not present

## 2020-07-01 DIAGNOSIS — D1801 Hemangioma of skin and subcutaneous tissue: Secondary | ICD-10-CM | POA: Diagnosis not present

## 2020-07-01 DIAGNOSIS — C44622 Squamous cell carcinoma of skin of right upper limb, including shoulder: Secondary | ICD-10-CM | POA: Diagnosis not present

## 2020-07-01 DIAGNOSIS — Z85828 Personal history of other malignant neoplasm of skin: Secondary | ICD-10-CM | POA: Diagnosis not present

## 2020-07-01 DIAGNOSIS — L308 Other specified dermatitis: Secondary | ICD-10-CM | POA: Diagnosis not present

## 2020-07-29 ENCOUNTER — Ambulatory Visit: Payer: Medicare PPO | Attending: Hematology and Oncology | Admitting: Physical Therapy

## 2020-07-29 ENCOUNTER — Other Ambulatory Visit: Payer: Self-pay

## 2020-07-29 DIAGNOSIS — Z483 Aftercare following surgery for neoplasm: Secondary | ICD-10-CM

## 2020-07-29 NOTE — Therapy (Signed)
[place], [place], [address] Phone: [phone]   Fax:  [phone]  Physical Therapy Treatment  Patient Details  Name: Catherine Long MRN: [id number] Date of Birth: 1950/11/03 Referring Provider (PT): Dr. [name]   Encounter Date: 07/29/2020   PT End of Session - 07/29/20 1251    Visit Number 3   SOZO screen   PT Start Time 0815    PT Stop Time 0830    PT Time Calculation (min) 15 min           Past Medical History:  Diagnosis Date  . Asthma    several years ago  . Cancer ([place])    Phreesia 05/11/2020  . Family history of breast cancer   . Family history of melanoma   . Family history of ovarian cancer   . History of melanoma   . Melanoma ([place])   . PONV (postoperative nausea and vomiting)   . Right middle lobe syndrome     Past Surgical History:  Procedure Laterality Date  . BREAST SURGERY N/A    Phreesia 05/11/2020  . JOINT REPLACEMENT N/A    Phreesia 05/11/2020  . RADIOACTIVE SEED GUIDED PARTIAL MASTECTOMY WITH AXILLARY SENTINEL LYMPH NODE BIOPSY Right 09/27/2019   Procedure: RIGHT RADIOACTIVE SEED GUIDED LUMPECTOMY WITH  SENTINEL LYMPH NODE BIOPSY;  Surgeon: [name], MD;  Location: [place] [facility];  Service: General;  Laterality: Right;  LMA  . TONSILLECTOMY    . TOTAL HIP ARTHROPLASTY  2003 and 2006   Bilateral    There were no vitals filed for this visit.   Subjective Assessment - 07/29/20 1249    Subjective Pt here for SOZO screen    Pertinent History Patient was diagnosed on 08/14/2019 with right grade II invasive ductal carcinoma breast cancer. Patient underwent a right lumpectomy and sentinel node biopsy (4 negative nodes removed) on 09/27/2019. It is ER/PR positive and HER2 negative with a Ki67 of 2%. She has had bilateral hip replacements, right in 2003 and left in 2006. She also has a history of right arm melanoma in 1975.                  L-DEX  FLOWSHEETS - 07/29/20 1200      L-DEX LYMPHEDEMA SCREENING   Measurement Type Unilateral    L-DEX MEASUREMENT EXTREMITY Upper Extremity    POSITION  Standing    DOMINANT SIDE Right    At Risk Side Right    BASELINE SCORE (UNILATERAL) 2.1    L-DEX SCORE (UNILATERAL) 1.5                                  PT Long Term Goals - 10/19/19 1249      PT LONG TERM GOAL #1   Title Patient will demonstrate she has regained full shoulder ROM and function post operatively compared to baselines.    Time 8    Period Weeks    Status Achieved                 Plan - 07/29/20 1253    Clinical Impression Statement pt measured within recommended range of baseline           Patient will benefit from skilled therapeutic intervention in order to improve the following deficits and impairments:     Visit Diagnosis: Aftercare following surgery for neoplasm     Problem List  Patient Active Problem List   Diagnosis Date Noted  . Genetic testing 09/20/2019  . History of melanoma   . Family history of breast cancer   . Family history of ovarian cancer   . Family history of melanoma   . Malignant neoplasm of upper-inner quadrant of right breast in female, estrogen receptor positive ([place]) 09/07/2019  . Osteopenia 05/11/2017  . Asthma, chronic, resolved 04/26/2012  . Right middle lobe syndrome 04/26/2012   Catherine Long. Owens Shark PT  Norwood Levo 07/29/2020, 12:53 PM  [place], [place], [address] Phone: [phone]   Fax:  [phone]  Name: Catherine Long MRN: [id number] Date of Birth: 03-24-1951

## 2020-08-12 ENCOUNTER — Other Ambulatory Visit: Payer: Self-pay

## 2020-08-12 ENCOUNTER — Ambulatory Visit
Admission: RE | Admit: 2020-08-12 | Discharge: 2020-08-12 | Disposition: A | Discharge status: Home / Self Care | Payer: Medicare PPO | Source: Ambulatory Visit | Attending: Adult Health | Admitting: Adult Health

## 2020-08-12 DIAGNOSIS — Z170 Estrogen receptor positive status [ER+]: Secondary | ICD-10-CM

## 2020-08-12 DIAGNOSIS — C50211 Malignant neoplasm of upper-inner quadrant of right female breast: Secondary | ICD-10-CM

## 2020-08-22 DIAGNOSIS — Z85828 Personal history of other malignant neoplasm of skin: Secondary | ICD-10-CM | POA: Diagnosis not present

## 2020-08-22 DIAGNOSIS — L570 Actinic keratosis: Secondary | ICD-10-CM | POA: Diagnosis not present

## 2020-08-22 DIAGNOSIS — L244 Irritant contact dermatitis due to drugs in contact with skin: Secondary | ICD-10-CM | POA: Diagnosis not present

## 2020-08-28 DIAGNOSIS — N6310 Unspecified lump in the right breast, unspecified quadrant: Secondary | ICD-10-CM | POA: Diagnosis not present

## 2020-08-28 DIAGNOSIS — Z01419 Encounter for gynecological examination (general) (routine) without abnormal findings: Secondary | ICD-10-CM | POA: Diagnosis not present

## 2020-08-28 DIAGNOSIS — Z6827 Body mass index (BMI) 27.0-27.9, adult: Secondary | ICD-10-CM | POA: Diagnosis not present

## 2020-09-09 ENCOUNTER — Other Ambulatory Visit: Payer: Self-pay | Admitting: Adult Health

## 2020-09-09 ENCOUNTER — Other Ambulatory Visit: Payer: Self-pay

## 2020-09-09 ENCOUNTER — Ambulatory Visit
Admission: RE | Admit: 2020-09-09 | Discharge: 2020-09-09 | Disposition: A | Discharge status: Home / Self Care | Payer: Medicare PPO | Source: Ambulatory Visit | Attending: Adult Health | Admitting: Adult Health

## 2020-09-09 DIAGNOSIS — R922 Inconclusive mammogram: Secondary | ICD-10-CM | POA: Diagnosis not present

## 2020-09-09 DIAGNOSIS — N6310 Unspecified lump in the right breast, unspecified quadrant: Secondary | ICD-10-CM

## 2020-09-09 DIAGNOSIS — C50911 Malignant neoplasm of unspecified site of right female breast: Secondary | ICD-10-CM | POA: Diagnosis not present

## 2020-09-10 ENCOUNTER — Ambulatory Visit
Admission: RE | Admit: 2020-09-10 | Discharge: 2020-09-10 | Disposition: A | Discharge status: Home / Self Care | Payer: Medicare PPO | Source: Ambulatory Visit | Attending: Adult Health | Admitting: Adult Health

## 2020-09-10 ENCOUNTER — Other Ambulatory Visit: Payer: Self-pay

## 2020-09-10 ENCOUNTER — Other Ambulatory Visit: Payer: Self-pay | Admitting: Radiology

## 2020-09-10 DIAGNOSIS — N6310 Unspecified lump in the right breast, unspecified quadrant: Secondary | ICD-10-CM

## 2020-09-10 DIAGNOSIS — N6311 Unspecified lump in the right breast, upper outer quadrant: Secondary | ICD-10-CM | POA: Diagnosis not present

## 2020-09-10 DIAGNOSIS — D241 Benign neoplasm of right breast: Secondary | ICD-10-CM | POA: Diagnosis not present

## 2020-09-18 ENCOUNTER — Other Ambulatory Visit: Payer: Self-pay | Admitting: Hematology and Oncology

## 2020-09-29 NOTE — Assessment & Plan Note (Signed)
09/07/2019:Screening mammogram detected a possible right breast mass. Diagnostic mammogram showed two adjacent and nearly contiguous masses at the 2 o'clock position, 1.7cm, no right axillary adenopathy. Biopsy showed invasive mammary carcinoma, grade 2, HER-2 equivocal by IHC, negative by FISH, ER+ 95%, PR+ 90%, Ki67 2%. T1CN0 stage Ia clinical stage  Right lumpectomy 09/27/2019: Grade 2 IDC 1.5 cm with intermediate grade DCIS, margins negative, 0/4 lymph nodes negative, ER 95%, PR 90%, HER-2 negative, Ki-67 2% Oncotype score: 9, distant recurrence at 9 years: 3%  Adjuvant radiation: 11/08/2019-12/04/2019   Recommendation: Adjuvant antiestrogen therapy with anastrozole 1 mg daily x5 to 7 years started 12/21/19  Anastrozole Toxicities:  Breast Cancer Surveillance: 1. Breast Exam: 09/30/20: Benign 2. Mammogram: 09/09/20: U/S 0.6 cm mass at 10 o clock pos. Biopsy 09/10/20: Fibroadenoma  RTC in 1 year for follow up

## 2020-09-29 NOTE — Progress Notes (Signed)
Patient Care Team: [name], MD as PCP - General (Family Medicine) [name] as Physician Assistant (Unknown Physician Specialty) [name], MD as Consulting Physician (Hematology and Oncology) [name], MD as Attending Physician (Radiation Oncology) [name], MD as Consulting Physician (General Surgery)  DIAGNOSIS:    ICD-10-CM   1. Malignant neoplasm of upper-inner quadrant of right breast in female, estrogen receptor positive ([place])  C50.211    Z17.0     SUMMARY OF ONCOLOGIC HISTORY: Oncology History  Malignant neoplasm of upper-inner quadrant of right breast in female, estrogen receptor positive ([place])  09/07/2019 Initial Diagnosis   Screening mammogram detected a possible right breast mass. Diagnostic mammogram showed two adjacent and nearly contiguous masses at the 2 o'clock position, 1.7cm, no right axillary adenopathy. Biopsy showed invasive mammary carcinoma, grade 2, HER-2 equivocal by IHC, negative by FISH, ER+ 95%, PR+ 90%, Ki67 2%.   09/19/2019 Genetic Testing   Negative genetic testing:  No pathogenic variants detected on the [facility] Breast Cancer STAT panel, Common Hereditary Cancers panel, or Melanoma panel. A variant of uncertain significance (VUS) was detected in the POLE gene called c.1357C>G. The report date is 09/19/2019.  UPDATE:  The POLE c.1357C>G VUS was reclassified to "likely benign" on 02/22/2020.  The STAT Breast cancer panel offered by [facility] includes sequencing and rearrangement analysis for the following 9 genes:  ATM, BRCA1, BRCA2, CDH1, CHEK2, PALB2, PTEN, STK11 and TP53. The Common Hereditary Cancers Panel offered by [facility] includes sequencing and/or deletion duplication testing of the following 48 genes: APC, ATM, AXIN2, BARD1, BMPR1A, BRCA1, BRCA2, BRIP1, CDH1, CDK4, CDKN2A (p14ARF), CDKN2A (p16INK4a), CHEK2, CTNNA1, DICER1, EPCAM (Deletion/duplication testing only), GREM1 (promoter region deletion/duplication testing  only), KIT, MEN1, MLH1, MSH2, MSH3, MSH6, MUTYH, NBN, NF1, NHTL1, PALB2, PDGFRA, PMS2, POLD1, POLE, PTEN, RAD50, RAD51C, RAD51D, RNF43, SDHB, SDHC, SDHD, SMAD4, SMARCA4. STK11, TP53, TSC1, TSC2, and VHL.  The following genes were evaluated for sequence changes only: SDHA and HOXB13 c.251G>A variant only. The Melanoma panel offered by [facility] includes sequencing and/or deletion duplication testing of the following 9 genes: BAP1, BRCA2, BRIP1, CDK4, CDKN2A (p14ARF), CDKN2A (p16INK4a), POT1, PTEN, RB1, and TP53.  The following gene was evaluated for sequence changes only: MITF (c.952G>A, p.Glu318Lys variant only).    09/27/2019 Surgery   Right lumpectomy Ninfa Linden) [phone]): Grade 2 IDC 1.5 cm with intermediate grade DCIS, margins negative, 4 lymph nodes negative, ER 95%, PR 90%, HER-2 negative, Ki-67 2%   10/04/2019 Cancer Staging   Staging form: Breast, AJCC 8th Edition - Pathologic stage from 10/04/2019: Stage IA (pT1c, pN0, cM0, G2, ER+, PR+, HER2-)    10/11/2019 Oncotype testing   The Oncotype DX score was 9 predicting a risk of outside the breast recurrence over the next 9 years of 3% if the patient's only systemic therapy is tamoxifen for 5 years.    11/07/2019 - 12/04/2019 Radiation Therapy   The patient initially received a dose of 40.05 Gy in 15 fractions to the breast using whole-breast tangent fields. This was delivered using a 3-D conformal technique. The pt received a boost delivering an additional 10 Gy in 5 fractions using a electron boost with 40mV electrons. The total dose was 50.05 Gy.   12/2019 - 12/2024 Anti-estrogen oral therapy   Anastrozole     CHIEF COMPLIANT: Follow-up of right breast cancer on anastrozole   INTERVAL HISTORY: Catherine Long a 70y.o. with above-mentioned history of right breast cancer who underwent a right lumpectomy, radiation, and  is currently on antiestrogen therapy with with anastrozole. She presents to the clinic today for follow-up.    ALLERGIES:  has No Known Allergies.  MEDICATIONS:  Current Outpatient Medications  Medication Sig Dispense Refill  . anastrozole (ARIMIDEX) 1 MG tablet TAKE 1 TABLET EVERY DAY 90 tablet 3  . ascorbic acid (VITAMIN C) 1000 MG tablet Take 1,000 mg by mouth daily.    . B Complex-C (SUPER B COMPLEX) TABS Take 1 tablet by mouth daily.    . Calcium Carbonate-Vitamin D3 (CALCIUM 600/VITAMIN D) 600-400 MG-UNIT TABS Take 2 tablets by mouth daily.    . Cholecalciferol (VITAMIN D) 50 MCG (2000 UT) tablet Take 2,000 Units by mouth daily.     . Multiple Vitamins-Minerals (MULTI COMPLETE/IRON PO) Take 1 tablet by mouth daily.    . Turmeric 500 MG CAPS Take 500 mg by mouth daily.      No current facility-administered medications for this visit.    PHYSICAL EXAMINATION: ECOG PERFORMANCE STATUS: 1 - Symptomatic but completely ambulatory  Vitals:   09/30/20 0936  BP: 139/61  Pulse: 78  Resp: 16  Temp: 97.9 F (36.6 C)  SpO2: 97%   Filed Weights   09/30/20 0936  Weight: 152 lb 9.6 oz (69.2 kg)    BREAST: No palpable masses or nodules in either right or left breasts. No palpable axillary supraclavicular or infraclavicular adenopathy no breast tenderness or nipple discharge. (exam performed in the presence of a chaperone)  LABORATORY DATA:  I have reviewed the data as listed CMP Latest Ref Rng & Units 05/06/2020 09/13/2019 05/19/2019  Glucose 65 - 99 mg/dL 88 101(H) 94  BUN 7 - 25 mg/dL _0 Creatinine 0.50 - 0.99 mg/dL 0.75 0.82 0.77  Sodium 135 - 146 mmol/L 140 142 139  Potassium 3.5 - 5.3 mmol/L 4.1 4.3 5.2  Chloride 98 - 110 mmol/L 103 104 101  CO2 20 - 32 mmol/L _1 Calcium 8.6 - 10.4 mg/dL 9.6 9.4 9.7  Total Protein 6.1 - 8.1 g/dL 6.7 7.8 6.6  Total Bilirubin 0.2 - 1.2 mg/dL 0.6 0.4 0.5  Alkaline Phos 38 - 126 U/L - 62 -  AST 10 - 35 U/L _2 ALT 6 - 29 U/L _3 Lab Results  Component Value Date   WBC 6.9 05/06/2020   HGB 14.1 05/06/2020   HCT 43.5  05/06/2020   MCV 89.5 05/06/2020   PLT 288 05/06/2020   NEUTROABS 4,906 05/06/2020    ASSESSMENT & PLAN:  Malignant neoplasm of upper-inner quadrant of right breast in female, estrogen receptor positive ([place]) 09/07/2019:Screening mammogram detected a possible right breast mass. Diagnostic mammogram showed two adjacent and nearly contiguous masses at the 2 o'clock position, 1.7cm, no right axillary adenopathy. Biopsy showed invasive mammary carcinoma, grade 2, HER-2 equivocal by IHC, negative by FISH, ER+ 95%, PR+ 90%, Ki67 2%. T1CN0 stage Ia clinical stage  Right lumpectomy 09/27/2019: Grade 2 IDC 1.5 cm with intermediate grade DCIS, margins negative, 0/4 lymph nodes negative, ER 95%, PR 90%, HER-2 negative, Ki-67 2% Oncotype score: 9, distant recurrence at 9 years: 3%  Adjuvant radiation: 11/08/2019-12/04/2019   Recommendation: Adjuvant antiestrogen therapy with anastrozole 1 mg daily x5 to 7 years started 12/21/19  Anastrozole Toxicities: Denies any major adverse effects to anastrozole therapy.  Denies any hot flashes or arthralgias or myalgias. Bone density January 2021: T score -1.9: Osteopenia  Breast Cancer Surveillance: 1. Breast Exam: 09/30/20: Benign 2. Mammogram:  09/09/20: U/S 0.6 cm mass at 10 o clock pos. Biopsy 09/10/20: Fibroadenoma  RTC in 1 year for follow up    No orders of the defined types were placed in this encounter.  The patient has a good understanding of the overall plan. she agrees with it. she will call with any problems that may develop before the next visit here.  Total time spent: 20 mins including face to face time and time spent for planning, charting and coordination of care  Rulon Eisenmenger, MD, MPH 09/30/2020  I, Cloyde Reams Dorshimer, am acting as scribe for Dr. [name].  I have reviewed the above documentation for accuracy and completeness, and I agree with the above.

## 2020-09-30 ENCOUNTER — Inpatient Hospital Stay: Payer: Medicare PPO | Attending: Hematology and Oncology | Admitting: Hematology and Oncology

## 2020-09-30 ENCOUNTER — Encounter: Payer: Self-pay | Admitting: *Deleted

## 2020-09-30 ENCOUNTER — Other Ambulatory Visit: Payer: Self-pay

## 2020-09-30 DIAGNOSIS — Z79811 Long term (current) use of aromatase inhibitors: Secondary | ICD-10-CM | POA: Diagnosis not present

## 2020-09-30 DIAGNOSIS — Z170 Estrogen receptor positive status [ER+]: Secondary | ICD-10-CM

## 2020-09-30 DIAGNOSIS — C50211 Malignant neoplasm of upper-inner quadrant of right female breast: Secondary | ICD-10-CM

## 2020-09-30 NOTE — Research (Signed)
09/30/2020 at 12:13pm WF- 97415/UpBeat - Consent Addendum Form Script (protocol version date 03/07/20)- The research nurse met with the pt in the clinic today to discuss her upcoming month 12 visit due in May 2022.  The pt requested that her month 12 visit occur on 12/19/20. The research nurse explained that the study had a new Consent Addendum, and the research nurse needed to go over the consent with the pt. The research nurse read the consent addendum to the pt.  The pt gave her verbal consent for this addendum and agreed to complete her questionnaires by email at 9:39 am. The pt confirmed her email address. The pt also agreed to complete the new questionnaire at the month 24 visit. The pt was specifically told that she could skip any questions that make her feel uncomfortable and that she could withdraw at any time. The pt was thanked for her continued support of this clinical trial. The pt's questionnaires will be sent to her email provided by the pt today. The pt was given a copy of her consent addendum for her reference. The pt was told that the research staff will callthept acouple of days before her 5/19/22apptto make sure that she has received her questionnaires by email,   The pt had no questions/concerns for the research nurse. Brion Aliment RN, BSN, CCRP Clinical Research Nurse Lead 09/30/2020 12:20 PM

## 2020-10-28 ENCOUNTER — Other Ambulatory Visit: Payer: Self-pay

## 2020-10-28 ENCOUNTER — Ambulatory Visit: Payer: Medicare PPO | Attending: Hematology and Oncology

## 2020-10-28 DIAGNOSIS — Z483 Aftercare following surgery for neoplasm: Secondary | ICD-10-CM | POA: Insufficient documentation

## 2020-10-28 NOTE — Therapy (Signed)
[place], [place], [address] Phone: [phone]   Fax:  [phone]  Physical Therapy Treatment  Patient Details  Name: Catherine Long MRN: [id number] Date of Birth: 1950-10-02 No data recorded  Encounter Date: 10/28/2020   PT End of Session - 10/28/20 0855    Visit Number 3   # unchanged due to screen only   Number of Visits 2    PT Start Time 0849    PT Stop Time 0855    PT Time Calculation (min) 6 min    Activity Tolerance Patient tolerated treatment well    Behavior During Therapy [facility] for tasks assessed/performed           Past Medical History:  Diagnosis Date  . Asthma    several years ago  . Cancer ([place])    Phreesia 05/11/2020  . Family history of breast cancer   . Family history of melanoma   . Family history of ovarian cancer   . History of melanoma   . Melanoma ([place])   . PONV (postoperative nausea and vomiting)   . Right middle lobe syndrome     Past Surgical History:  Procedure Laterality Date  . BREAST SURGERY N/A    Phreesia 05/11/2020  . JOINT REPLACEMENT N/A    Phreesia 05/11/2020  . RADIOACTIVE SEED GUIDED PARTIAL MASTECTOMY WITH AXILLARY SENTINEL LYMPH NODE BIOPSY Right 09/27/2019   Procedure: RIGHT RADIOACTIVE SEED GUIDED LUMPECTOMY WITH  SENTINEL LYMPH NODE BIOPSY;  Surgeon: [name], MD;  Location: [place];  Service: General;  Laterality: Right;  LMA  . TONSILLECTOMY    . TOTAL HIP ARTHROPLASTY  2003 and 2006   Bilateral    There were no vitals filed for this visit.   Subjective Assessment - 10/28/20 0851    Subjective Pt returns for her 3 month L-Dex screen.    Pertinent History Patient was diagnosed on 08/14/2019 with right grade II invasive ductal carcinoma breast cancer. Patient underwent a right lumpectomy and sentinel node biopsy (4 negative nodes removed) on 09/27/2019. It is ER/PR positive and HER2 negative with a Ki67 of 2%. She has had bilateral  hip replacements, right in 2003 and left in 2006. She also has a history of right arm melanoma in 1975.                  L-DEX FLOWSHEETS - 10/28/20 0800      L-DEX LYMPHEDEMA SCREENING   Measurement Type Unilateral    L-DEX MEASUREMENT EXTREMITY Upper Extremity    POSITION  Standing    DOMINANT SIDE Right    At Risk Side Right    BASELINE SCORE (UNILATERAL) 2.1    L-DEX SCORE (UNILATERAL) 3.3    VALUE CHANGE (UNILAT) 1.2                                  PT Long Term Goals - 10/19/19 1249      PT LONG TERM GOAL #1   Title Patient will demonstrate she has regained full shoulder ROM and function post operatively compared to baselines.    Time 8    Period Weeks    Status Achieved                 Plan - 10/28/20 0856    Clinical Impression Statement Pt returns for her 3 month L-Dex screen. Her change from baseline of 1.2  is WNLs so no further treatment is required at this time except to cont every 3 month L-Dex screens which pt is agreeable to.    PT Next Visit Plan Continue every 3 month L-Dex screens for up to 2 years from surgery.    Consulted and Agree with Plan of Care Patient           Patient will benefit from skilled therapeutic intervention in order to improve the following deficits and impairments:     Visit Diagnosis: Aftercare following surgery for neoplasm     Problem List Patient Active Problem List   Diagnosis Date Noted  . Genetic testing 09/20/2019  . History of melanoma   . Family history of breast cancer   . Family history of ovarian cancer   . Family history of melanoma   . Malignant neoplasm of upper-inner quadrant of right breast in female, estrogen receptor positive ([place]) 09/07/2019  . Osteopenia 05/11/2017  . Asthma, chronic, resolved 04/26/2012  . Right middle lobe syndrome 04/26/2012    Otelia Limes, PTA 10/28/2020, 8:57 AM  [place], [place], [address] Phone: [phone]   Fax:  [phone]  Name: Catherine Long MRN: [id number] Date of Birth: July 24, 1951

## 2020-12-12 ENCOUNTER — Telehealth: Payer: Self-pay | Admitting: *Deleted

## 2020-12-12 NOTE — Telephone Encounter (Signed)
WF [id number] Understanding and Predicting Breast Cancer Events after Treatment (UPBEAT)  The pt was called this afternoon as a reminder about her upcoming month 12 UpBEAT visit assessments scheduled for 12/19/20.  The pt said that she appreciated the reminder call.  The pt was informed that her month 12 research labs will be drawn, and the pt was told to fast for at least 3 hours prior to her appt.  The pt verbalized understanding.  The pt was also told to wear comfortable clothes to her appt for walking and arm measurements. The pt was reminded that she agreed to complete her questionnaires online prior to her clinic visit.  The pt confirmed that she received her questionnaires via email yesterday.  The pt said that she will complete them either today or tomorrow.  The pt was thanked for her continued support of this clinical trial.  Brion Aliment RN, BSN, [place] Nurse Lead 12/12/2020 4:08 PM

## 2020-12-19 ENCOUNTER — Inpatient Hospital Stay: Payer: Medicare PPO | Admitting: Hematology and Oncology

## 2020-12-19 ENCOUNTER — Encounter: Payer: Self-pay | Admitting: *Deleted

## 2020-12-19 ENCOUNTER — Inpatient Hospital Stay: Payer: Medicare PPO | Attending: Hematology and Oncology | Admitting: *Deleted

## 2020-12-19 ENCOUNTER — Inpatient Hospital Stay: Payer: Medicare PPO

## 2020-12-19 ENCOUNTER — Other Ambulatory Visit: Payer: Self-pay

## 2020-12-19 DIAGNOSIS — Z170 Estrogen receptor positive status [ER+]: Secondary | ICD-10-CM

## 2020-12-19 DIAGNOSIS — C50211 Malignant neoplasm of upper-inner quadrant of right female breast: Secondary | ICD-10-CM

## 2020-12-19 LAB — RESEARCH LABS

## 2020-12-19 NOTE — Research (Signed)
WF 606[address] Understanding and Predicting Breast Cancer Events after Treatment (UPBEAT)  12/19/2020- Patient Catherine Long [facility] Unaccompanied for her UpBeat 12 month visit.    PROs: All PROs (Self-administered questionnaire, Covid-19 questionnaire, and the [facility] Cardiomyopathy questionnaire) required for this visit were completed prior to this visit at the pt's home.  The research nurse checked for completeness.    LABS:   The month 21 [facility] research samples were collected this morning per consent and study protocol.  Patient Catherine Long tolerated well without complaint.  The pt was informed that she will be contacted with her lab results when they are available to the site.  The pt verbalized understanding.     MEDICATION REVIEW: Patient reviewed and verified that her current medication list is correct.  The pt states she is only taking anastrozole daily and vitamins/supplements.    VITAL SIGNS: Vital signs were collected per study protocol.  The pt was seated comfortably for 5 minutes before obtaining the pt's readings.  The pt's waist measurement was 36 inches.    ADVERSE EVENTS: Patient Catherine Long denies any hospitalizations since her last visit.  The pt specifically denies the following conditions: heart attack, angioplasty, bypass operation, heart catheterization, stroke and heart failure.    CARDIAC MRI: The study does not require a cardiac MRI at this time point.    NEUROCOGNITIVE ASSESSMENT: Neurocognitive assessment completed by Remer Macho, research specialist.   PHYSICAL TESTING: Physical tests completed by this clinical research Nurse with the assistance of clinical research Coordinator, Carol Ada.  GIFT CARD: This study does provide visit compensation, and the pt was given her $25 gift card.   DISPOSITION: Upon completion off all study requirements, patient was escorted to the elevator.   The patient was thanked for their time and continued voluntary participation in this  study. Patient Catherine Long has been provided direct contact information and is encouraged to contact this Nurse for any needs or questions.  Brion Aliment RN, BSN, CCRP Clinical Research Nurse Lead 12/19/2020 11:45 AM

## 2021-01-02 DIAGNOSIS — L853 Xerosis cutis: Secondary | ICD-10-CM | POA: Diagnosis not present

## 2021-01-02 DIAGNOSIS — L814 Other melanin hyperpigmentation: Secondary | ICD-10-CM | POA: Diagnosis not present

## 2021-01-02 DIAGNOSIS — Z85828 Personal history of other malignant neoplasm of skin: Secondary | ICD-10-CM | POA: Diagnosis not present

## 2021-01-02 DIAGNOSIS — L570 Actinic keratosis: Secondary | ICD-10-CM | POA: Diagnosis not present

## 2021-01-02 DIAGNOSIS — D1801 Hemangioma of skin and subcutaneous tissue: Secondary | ICD-10-CM | POA: Diagnosis not present

## 2021-01-02 DIAGNOSIS — L821 Other seborrheic keratosis: Secondary | ICD-10-CM | POA: Diagnosis not present

## 2021-02-24 ENCOUNTER — Ambulatory Visit: Payer: Medicare PPO

## 2021-03-21 DIAGNOSIS — Z853 Personal history of malignant neoplasm of breast: Secondary | ICD-10-CM | POA: Diagnosis not present

## 2021-03-24 ENCOUNTER — Other Ambulatory Visit: Payer: Self-pay

## 2021-03-24 ENCOUNTER — Ambulatory Visit: Payer: Medicare PPO | Attending: Adult Health

## 2021-03-24 VITALS — Wt 141.0 lb

## 2021-03-24 DIAGNOSIS — Z483 Aftercare following surgery for neoplasm: Secondary | ICD-10-CM | POA: Insufficient documentation

## 2021-03-24 NOTE — Therapy (Signed)
[place], [place], [address] Phone: [phone]   Fax:  [phone]  Physical Therapy Treatment  Patient Details  Name: Catherine Long MRN: [id number] Date of Birth: 12/28/1950 No data recorded  Encounter Date: 03/24/2021   PT End of Session - 03/24/21 0906     Visit Number 3   # unchanged due to screen only   PT Start Time 0901    PT Stop Time 0906    PT Time Calculation (min) 5 min    Activity Tolerance Patient tolerated treatment well    Behavior During Therapy [facility] for tasks assessed/performed             Past Medical History:  Diagnosis Date   Asthma    several years ago   Cancer [facility])    Phreesia 05/11/2020   Family history of breast cancer    Family history of melanoma    Family history of ovarian cancer    History of melanoma    Melanoma ([place])    PONV (postoperative nausea and vomiting)    Right middle lobe syndrome     Past Surgical History:  Procedure Laterality Date   BREAST SURGERY N/A    Phreesia 05/11/2020   JOINT REPLACEMENT N/A    Phreesia 05/11/2020   RADIOACTIVE SEED GUIDED PARTIAL MASTECTOMY WITH AXILLARY SENTINEL LYMPH NODE BIOPSY Right 09/27/2019   Procedure: RIGHT RADIOACTIVE SEED GUIDED LUMPECTOMY WITH  SENTINEL LYMPH NODE BIOPSY;  Surgeon: [name], MD;  Location: [place];  Service: General;  Laterality: Right;  LMA   TONSILLECTOMY     TOTAL HIP ARTHROPLASTY  2003 and 2006   Bilateral    Vitals:   03/24/21 0902  Weight: 141 lb (64 kg)     Subjective Assessment - 03/24/21 0903     Subjective Pt returns for her 3 month L-Dex screen.    Pertinent History Patient was diagnosed on 08/14/2019 with right grade II invasive ductal carcinoma breast cancer. Patient underwent a right lumpectomy and sentinel node biopsy (4 negative nodes removed) on 09/27/2019. It is ER/PR positive and HER2 negative with a Ki67 of 2%. She has had bilateral hip replacements,  right in 2003 and left in 2006. She also has a history of right arm melanoma in 1975.                    L-DEX FLOWSHEETS - 03/24/21 0900       L-DEX LYMPHEDEMA SCREENING   Measurement Type Unilateral    L-DEX MEASUREMENT EXTREMITY Upper Extremity    POSITION  Standing    DOMINANT SIDE Right    At Risk Side Right    BASELINE SCORE (UNILATERAL) 2.1    L-DEX SCORE (UNILATERAL) -0.2    VALUE CHANGE (UNILAT) -2.3                                    PT Long Term Goals - 10/19/19 1249       PT LONG TERM GOAL #1   Title Patient will demonstrate she has regained full shoulder ROM and function post operatively compared to baselines.    Time 8    Period Weeks    Status Achieved                   Plan - 03/24/21 0906     Clinical Impression Statement Pt returns for  her 3 month L-Dex screen. Her change from baseline of -2.3 is WNLs so no further treatment is required at this time except to cont every 3 month L-Dex screens which pt is agreeable to.    PT Next Visit Plan Continue every 3 month L-Dex screens for up to 2 years from surgery.    Consulted and Agree with Plan of Care Patient             Patient will benefit from skilled therapeutic intervention in order to improve the following deficits and impairments:     Visit Diagnosis: Aftercare following surgery for neoplasm     Problem List Patient Active Problem List   Diagnosis Date Noted   Genetic testing 09/20/2019   History of melanoma    Family history of breast cancer    Family history of ovarian cancer    Family history of melanoma    Malignant neoplasm of upper-inner quadrant of right breast in female, estrogen receptor positive ([place]) 09/07/2019   Osteopenia 05/11/2017   Asthma, chronic, resolved 04/26/2012   Right middle lobe syndrome 04/26/2012    Otelia Limes, PTA 03/24/2021, 9:08 AM  [place], [place], [address] Phone: [phone]   Fax:  [phone]  Name: Catherine Long MRN: [id number] Date of Birth: 1950-09-20

## 2021-05-12 ENCOUNTER — Other Ambulatory Visit: Payer: Medicare PPO

## 2021-05-12 ENCOUNTER — Other Ambulatory Visit: Payer: Self-pay

## 2021-05-12 DIAGNOSIS — Z136 Encounter for screening for cardiovascular disorders: Secondary | ICD-10-CM | POA: Diagnosis not present

## 2021-05-12 DIAGNOSIS — Z13220 Encounter for screening for lipoid disorders: Secondary | ICD-10-CM

## 2021-05-12 DIAGNOSIS — Z0000 Encounter for general adult medical examination without abnormal findings: Secondary | ICD-10-CM

## 2021-05-13 DIAGNOSIS — C50919 Malignant neoplasm of unspecified site of unspecified female breast: Secondary | ICD-10-CM | POA: Diagnosis not present

## 2021-05-13 DIAGNOSIS — M1990 Unspecified osteoarthritis, unspecified site: Secondary | ICD-10-CM | POA: Diagnosis not present

## 2021-05-13 DIAGNOSIS — C439 Malignant melanoma of skin, unspecified: Secondary | ICD-10-CM | POA: Diagnosis not present

## 2021-05-13 DIAGNOSIS — E663 Overweight: Secondary | ICD-10-CM | POA: Diagnosis not present

## 2021-05-13 DIAGNOSIS — R030 Elevated blood-pressure reading, without diagnosis of hypertension: Secondary | ICD-10-CM | POA: Diagnosis not present

## 2021-05-13 DIAGNOSIS — Z803 Family history of malignant neoplasm of breast: Secondary | ICD-10-CM | POA: Diagnosis not present

## 2021-05-13 DIAGNOSIS — Z8249 Family history of ischemic heart disease and other diseases of the circulatory system: Secondary | ICD-10-CM | POA: Diagnosis not present

## 2021-05-13 DIAGNOSIS — Z79811 Long term (current) use of aromatase inhibitors: Secondary | ICD-10-CM | POA: Diagnosis not present

## 2021-05-13 DIAGNOSIS — Z6826 Body mass index (BMI) 26.0-26.9, adult: Secondary | ICD-10-CM | POA: Diagnosis not present

## 2021-05-13 LAB — COMPLETE METABOLIC PANEL WITH GFR
AG Ratio: 1.8 (calc) (ref 1.0–2.5)
ALT: 15 U/L (ref 6–29)
AST: 17 U/L (ref 10–35)
Albumin: 4.3 g/dL (ref 3.6–5.1)
Alkaline phosphatase (APISO): 57 U/L (ref 37–153)
BUN: 15 mg/dL (ref 7–25)
CO2: 30 mmol/L (ref 20–32)
Calcium: 9.4 mg/dL (ref 8.6–10.4)
Chloride: 102 mmol/L (ref 98–110)
Creat: 0.83 mg/dL (ref 0.60–1.00)
Globulin: 2.4 g/dL (calc) (ref 1.9–3.7)
Glucose, Bld: 93 mg/dL (ref 65–99)
Potassium: 4.6 mmol/L (ref 3.5–5.3)
Sodium: 139 mmol/L (ref 135–146)
Total Bilirubin: 0.5 mg/dL (ref 0.2–1.2)
Total Protein: 6.7 g/dL (ref 6.1–8.1)
eGFR: 76 mL/min/{1.73_m2} (ref >=60)

## 2021-05-13 LAB — CBC WITH DIFFERENTIAL/PLATELET
Absolute Monocytes: 464 cells/uL (ref 200–950)
Basophils Absolute: 84 cells/uL (ref 0–200)
Basophils Relative: 1.1 %
Eosinophils Absolute: 152 cells/uL (ref 15–500)
Eosinophils Relative: 2 %
HCT: 43.5 % (ref 35.0–45.0)
Hemoglobin: 14.4 g/dL (ref 11.7–15.5)
Lymphs Abs: 2265 cells/uL (ref 850–3900)
MCH: 29.4 pg (ref 27.0–33.0)
MCHC: 33.1 g/dL (ref 32.0–36.0)
MCV: 89 fL (ref 80.0–100.0)
MPV: 9.4 fL (ref 7.5–12.5)
Monocytes Relative: 6.1 %
Neutro Abs: 4636 cells/uL (ref 1500–7800)
Neutrophils Relative %: 61 %
Platelets: 311 10*3/uL (ref 140–400)
RBC: 4.89 10*6/uL (ref 3.80–5.10)
RDW: 13.3 % (ref 11.0–15.0)
Total Lymphocyte: 29.8 %
WBC: 7.6 10*3/uL (ref 3.8–10.8)

## 2021-05-13 LAB — LIPID PANEL
Cholesterol: 154 mg/dL (ref <200)
HDL: 75 mg/dL (ref >=50)
LDL Cholesterol (Calc): 60 mg/dL (calc)
Non-HDL Cholesterol (Calc): 79 mg/dL (calc) (ref <130)
Total CHOL/HDL Ratio: 2.1 (calc) (ref <5.0)
Triglycerides: 107 mg/dL (ref <150)

## 2021-05-16 ENCOUNTER — Ambulatory Visit (INDEPENDENT_AMBULATORY_CARE_PROVIDER_SITE_OTHER): Payer: Medicare PPO | Admitting: Family Medicine

## 2021-05-16 ENCOUNTER — Other Ambulatory Visit: Payer: Self-pay

## 2021-05-16 VITALS — BP 110/70 | HR 90 | Temp 97.8°F | Resp 16 | Ht 64.5 in | Wt 149.0 lb

## 2021-05-16 DIAGNOSIS — Z853 Personal history of malignant neoplasm of breast: Secondary | ICD-10-CM

## 2021-05-16 DIAGNOSIS — Z0000 Encounter for general adult medical examination without abnormal findings: Secondary | ICD-10-CM | POA: Diagnosis not present

## 2021-05-16 DIAGNOSIS — Z85820 Personal history of malignant melanoma of skin: Secondary | ICD-10-CM

## 2021-05-16 DIAGNOSIS — Z23 Encounter for immunization: Secondary | ICD-10-CM

## 2021-05-16 NOTE — Progress Notes (Signed)
Subjective:    Patient ID: Catherine Long, female    DOB: 1951-06-09, 70 y.o.   MRN: [id number]  HPI Patient is a very sweet 70 year-old Caucasian female here today for complete physical exam.  She is currently on Arimidex.  Her last mammogram was in February.  She sees the cancer specialist and the surgeon annually to monitor her breast cancer.  Her colonoscopy was last in 2018 although I do not have any records of this despite sending multiple request.  The patient states that her colonoscopy was normal and therefore she is not due again until 2028.  Due to age she does not require a Pap smear.  Her bone density test was checked in 2021 and is not due again until 2023.  She is due for a flu shot, as well as a booster on the COVID-vaccine. Immunization History  Administered Date(s) Administered   Fluad Quad(high Dose 65+) 06/20/2019, 05/14/2020, 05/16/2021   Influenza Split 04/26/2012   Influenza, High Dose Seasonal PF 05/20/2017, 05/23/2018   Influenza,inj,Quad PF,6+ Mos 06/01/2013, 05/28/2014, 05/15/2016   PFIZER(Purple Top)SARS-COV-2 Vaccination 09/10/2019, 10/05/2019   Pneumococcal Conjugate-13 05/15/2016   Pneumococcal Polysaccharide-23 04/26/2012, 05/23/2018   Zoster Recombinat (Shingrix) 10/05/2017, 12/26/2017   Zoster, Live 05/30/2014   Her most recent lab work as listed below and is excellent: Lab on 05/12/2021  Component Date Value Ref Range Status   WBC 05/12/2021 7.6  3.8 - 10.8 Thousand/uL Final   RBC 05/12/2021 4.89  3.80 - 5.10 Million/uL Final   Hemoglobin 05/12/2021 14.4  11.7 - 15.5 g/dL Final   HCT 05/12/2021 43.5  35.0 - 45.0 % Final   MCV 05/12/2021 89.0  80.0 - 100.0 fL Final   MCH 05/12/2021 29.4  27.0 - 33.0 pg Final   MCHC 05/12/2021 33.1  32.0 - 36.0 g/dL Final   RDW 05/12/2021 13.3  11.0 - 15.0 % Final   Platelets 05/12/2021 311  140 - 400 Thousand/uL Final   MPV 05/12/2021 9.4  7.5 - 12.5 fL Final   Neutro Abs 05/12/2021 4,636  1,500 - 7,800 cells/uL  Final   Lymphs Abs 05/12/2021 2,265  850 - 3,900 cells/uL Final   Absolute Monocytes 05/12/2021 464  200 - 950 cells/uL Final   Eosinophils Absolute 05/12/2021 152  15 - 500 cells/uL Final   Basophils Absolute 05/12/2021 84  0 - 200 cells/uL Final   Neutrophils Relative % 05/12/2021 61  % Final   Total Lymphocyte 05/12/2021 29.8  % Final   Monocytes Relative 05/12/2021 6.1  % Final   Eosinophils Relative 05/12/2021 2.0  % Final   Basophils Relative 05/12/2021 1.1  % Final   Glucose, Bld 05/12/2021 93  65 - 99 mg/dL Final   Comment: .            Fasting reference interval .    BUN 05/12/2021 15  7 - 25 mg/dL Final   Creat 05/12/2021 0.83  0.60 - 1.00 mg/dL Final   eGFR 05/12/2021 76  > OR = 60 mL/min/1.62m Final   Comment: The eGFR is based on the CKD-EPI 2021 equation. To calculate  the new eGFR from a previous Creatinine or Cystatin C result, go to https://www.kidney.org/professionals/ kdoqi/gfr%5Fcalculator    BUN/Creatinine Ratio 124/09/7353NOT APPLICABLE  6 - 22 (calc) Final   Sodium 05/12/2021 139  135 - 146 mmol/L Final   Potassium 05/12/2021 4.6  3.5 - 5.3 mmol/L Final   Chloride 05/12/2021 102  98 - 110 mmol/L Final  CO2 05/12/2021 30  20 - 32 mmol/L Final   Calcium 05/12/2021 9.4  8.6 - 10.4 mg/dL Final   Total Protein 05/12/2021 6.7  6.1 - 8.1 g/dL Final   Albumin 05/12/2021 4.3  3.6 - 5.1 g/dL Final   Globulin 05/12/2021 2.4  1.9 - 3.7 g/dL (calc) Final   AG Ratio 05/12/2021 1.8  1.0 - 2.5 (calc) Final   Total Bilirubin 05/12/2021 0.5  0.2 - 1.2 mg/dL Final   Alkaline phosphatase (APISO) 05/12/2021 57  37 - 153 U/L Final   AST 05/12/2021 17  10 - 35 U/L Final   ALT 05/12/2021 15  6 - 29 U/L Final   Cholesterol 05/12/2021 154  <200 mg/dL Final   HDL 05/12/2021 75  > OR = 50 mg/dL Final   Triglycerides 05/12/2021 107  <150 mg/dL Final   LDL Cholesterol (Calc) 05/12/2021 60  mg/dL (calc) Final   Comment: Reference range: <100 . Desirable range <100 mg/dL for  primary prevention;   <70 mg/dL for patients with CHD or diabetic patients  with > or = 2 CHD risk factors. Marland Kitchen LDL-C is now calculated using the Martin-Hopkins  calculation, which is a validated novel method providing  better accuracy than the Friedewald equation in the  estimation of LDL-C.  Cresenciano Genre et al. Annamaria Helling. 3546;568(12): 2061-2068  (http://education.QuestDiagnostics.com/faq/FAQ164)    Total CHOL/HDL Ratio 05/12/2021 2.1  <5.0 (calc) Final   Non-HDL Cholesterol (Calc) 05/12/2021 79  <130 mg/dL (calc) Final   Comment: For patients with diabetes plus 1 major ASCVD risk  factor, treating to a non-HDL-C goal of <100 mg/dL  (LDL-C of <70 mg/dL) is considered a therapeutic  option.     Past Medical History:  Diagnosis Date   Asthma    several years ago   Cancer [facility])    Phreesia 05/11/2020   Family history of breast cancer    Family history of melanoma    Family history of ovarian cancer    History of melanoma    Melanoma ([place])    PONV (postoperative nausea and vomiting)    Right middle lobe syndrome    Past Surgical History:  Procedure Laterality Date   BREAST SURGERY N/A    Phreesia 05/11/2020   JOINT REPLACEMENT N/A    Phreesia 05/11/2020   RADIOACTIVE SEED GUIDED PARTIAL MASTECTOMY WITH AXILLARY SENTINEL LYMPH NODE BIOPSY Right 09/27/2019   Procedure: RIGHT RADIOACTIVE SEED GUIDED LUMPECTOMY WITH  SENTINEL LYMPH NODE BIOPSY;  Surgeon: [name], MD;  Location: [place];  Service: General;  Laterality: Right;  [place]  2003 and 2006   Bilateral   Current Outpatient Medications on File Prior to Visit  Medication Sig Dispense Refill   anastrozole (ARIMIDEX) 1 MG tablet TAKE 1 TABLET EVERY DAY 90 tablet 3   ascorbic acid (VITAMIN C) 1000 MG tablet Take 1,000 mg by mouth daily.     B Complex-C (SUPER B COMPLEX) TABS Take 1 tablet by mouth daily.     Calcium Carbonate-Vitamin D3 (CALCIUM 600/VITAMIN D) 600-400 MG-UNIT TABS  Take 2 tablets by mouth daily.     Multiple Vitamins-Minerals (MULTI COMPLETE/IRON PO) Take 1 tablet by mouth daily.     Turmeric 500 MG CAPS Take 500 mg by mouth daily.      No current facility-administered medications on file prior to visit.   Allergies  Allergen Reactions   Latex Other (See Comments), Rash and Dermatitis   Social History  Socioeconomic History   Marital status: Widowed    Spouse name: Not on file   Number of children: 2   Years of education: Not on file   Highest education level: Not on file  Occupational History   Occupation: Retired  Tobacco Use   Smoking status: Never   Smokeless tobacco: Never  Vaping Use   Vaping Use: Never used  Substance and Sexual Activity   Alcohol use: Yes    Alcohol/week: 3.0 - 4.0 standard drinks    Types: 3 - 4 Glasses of wine per week    Comment: Socially   Drug use: No   Sexual activity: Not on file  Other Topics Concern   Not on file  Social History Narrative   Not on file   Social Determinants of Health   Financial Resource Strain: Not on file  Food Insecurity: Not on file  Transportation Needs: Not on file  Physical Activity: Not on file  Stress: Not on file  Social Connections: Not on file  Intimate Partner Violence: Not on file   Family History  Problem Relation Age of Onset   Heart disease Father    Melanoma Father        dx. 7s   Breast cancer Mother        bilateral, dx. mid-50s   Melanoma Mother        dx. 62s   Melanoma Sister        dx. 74s   Breast cancer Maternal Aunt        dx. >50   Ovarian cancer Maternal Aunt        dx. >50      Review of Systems  All other systems reviewed and are negative.     Objective:   Physical Exam Vitals reviewed.  Constitutional:      General: She is not in acute distress.    Appearance: She is well-developed. She is not diaphoretic.  HENT:     Head: Normocephalic and atraumatic.     Right Ear: External ear normal.     Left Ear: External ear  normal.     Nose: Nose normal.     Mouth/Throat:     Pharynx: No oropharyngeal exudate.  Eyes:     General: No scleral icterus.       Right eye: No discharge.        Left eye: No discharge.     Conjunctiva/sclera: Conjunctivae normal.     Pupils: Pupils are equal, round, and reactive to light.  Neck:     Thyroid: No thyromegaly.     Vascular: No JVD.     Trachea: No tracheal deviation.  Cardiovascular:     Rate and Rhythm: Normal rate and regular rhythm.     Heart sounds: Normal heart sounds. No murmur heard.   No friction rub. No gallop.  Pulmonary:     Effort: Pulmonary effort is normal. No respiratory distress.     Breath sounds: Normal breath sounds. No stridor. No wheezing or rales.  Chest:     Chest wall: No tenderness.  Abdominal:     General: Bowel sounds are normal. There is no distension.     Palpations: Abdomen is soft. There is no mass.     Tenderness: There is no abdominal tenderness. There is no guarding or rebound.  Musculoskeletal:        General: No tenderness. Normal range of motion.     Cervical back: Normal range of motion and  neck supple.  Lymphadenopathy:     Cervical: No cervical adenopathy.  Skin:    General: Skin is warm.     Coloration: Skin is not pale.     Findings: No erythema or rash.  Neurological:     Mental Status: She is alert and oriented to person, place, and time.     Cranial Nerves: No cranial nerve deficit.     Motor: No abnormal muscle tone.     Coordination: Coordination normal.     Deep Tendon Reflexes: Reflexes are normal and symmetric.  Psychiatric:        Behavior: Behavior normal.        Thought Content: Thought content normal.        Judgment: Judgment normal.          Assessment & Plan:  Need for immunization against influenza - Plan: Flu Vaccine QUAD High Dose(Fluad)  Routine general medical examination at a health care facility  History of melanoma  History of breast cancer Patient's physical exam is  completely normal and her lab work is outstanding.  Her cancer screening is up-to-date and she is being followed by the oncologist and the surgeon for her breast cancer.  Patient received a flu shot today and I recommended the COVID booster at her earliest convenience.  The remainder of her immunizations are up-to-date.  She denies any falls or depression or memory loss.  Routine anticipatory guidance is provided.  Colonoscopy is up-to-date

## 2021-05-20 ENCOUNTER — Other Ambulatory Visit: Payer: Self-pay

## 2021-06-30 ENCOUNTER — Other Ambulatory Visit: Payer: Self-pay

## 2021-06-30 ENCOUNTER — Ambulatory Visit: Payer: Medicare PPO | Attending: Adult Health

## 2021-06-30 VITALS — Wt 146.4 lb

## 2021-06-30 DIAGNOSIS — Z483 Aftercare following surgery for neoplasm: Secondary | ICD-10-CM | POA: Insufficient documentation

## 2021-06-30 NOTE — Therapy (Signed)
[facility] @ [place], [place], [address] Phone: [phone]   Fax:  [phone]  Physical Therapy Treatment  Patient Details  Name: Catherine Long MRN: [id number] Date of Birth: 01/26/1951 No data recorded  Encounter Date: 06/30/2021   PT End of Session - 06/30/21 0846     Visit Number 3   # unchanged due to screen only   PT Start Time 8768    PT Stop Time 0849    PT Time Calculation (min) 5 min    Activity Tolerance Patient tolerated treatment well    Behavior During Therapy [facility] for tasks assessed/performed             Past Medical History:  Diagnosis Date   Asthma    several years ago   Cancer [facility])    Phreesia 05/11/2020   Family history of breast cancer    Family history of melanoma    Family history of ovarian cancer    History of melanoma    Melanoma ([place])    PONV (postoperative nausea and vomiting)    Right middle lobe syndrome     Past Surgical History:  Procedure Laterality Date   BREAST SURGERY N/A    Phreesia 05/11/2020   JOINT REPLACEMENT N/A    Phreesia 05/11/2020   RADIOACTIVE SEED GUIDED PARTIAL MASTECTOMY WITH AXILLARY SENTINEL LYMPH NODE BIOPSY Right 09/27/2019   Procedure: RIGHT RADIOACTIVE SEED GUIDED LUMPECTOMY WITH  SENTINEL LYMPH NODE BIOPSY;  Surgeon: [name], MD;  Location: [facility];  Service: General;  Laterality: Right;  LMA   TONSILLECTOMY     TOTAL HIP ARTHROPLASTY  2003 and 2006   Bilateral    Vitals:   06/30/21 0845  Weight: 146 lb 6 oz (66.4 kg)     Subjective Assessment - 06/30/21 0846     Subjective Pt returns for her 3 month L-Dex screen.    Pertinent History Patient was diagnosed on 08/14/2019 with right grade II invasive ductal carcinoma breast cancer. Patient underwent a right lumpectomy and sentinel node biopsy (4 negative nodes removed) on 09/27/2019. It is ER/PR positive and HER2 negative with a Ki67 of 2%. She has had bilateral hip  replacements, right in 2003 and left in 2006. She also has a history of right arm melanoma in 1975.                    L-DEX FLOWSHEETS - 06/30/21 0800       L-DEX LYMPHEDEMA SCREENING   Measurement Type Unilateral    L-DEX MEASUREMENT EXTREMITY Upper Extremity    POSITION  Standing    DOMINANT SIDE Right    At Risk Side Right    BASELINE SCORE (UNILATERAL) 2.1    L-DEX SCORE (UNILATERAL) 0.6    VALUE CHANGE (UNILAT) -1.5                                     PT Long Term Goals - 10/19/19 1249       PT LONG TERM GOAL #1   Title Patient will demonstrate she has regained full shoulder ROM and function post operatively compared to baselines.    Time 8    Period Weeks    Status Achieved                   Plan - 06/30/21 1157  Clinical Impression Statement Pt returns for her 3 month L-Dex screen. Her change from baseline of -1.5 is WNLs so no further treatment is required at this time except to cont every 3 month L-Dex screen which pt is agreeable to.    PT Next Visit Plan Continue every 3 month L-Dex screens for up to 2 years from surgery (Feb 2023; after that every 6 months for 1 year    Consulted and Agree with Plan of Care Patient             Patient will benefit from skilled therapeutic intervention in order to improve the following deficits and impairments:     Visit Diagnosis: Aftercare following surgery for neoplasm     Problem List Patient Active Problem List   Diagnosis Date Noted   Genetic testing 09/20/2019   History of melanoma    Family history of breast cancer    Family history of ovarian cancer    Family history of melanoma    Malignant neoplasm of upper-inner quadrant of right breast in female, estrogen receptor positive ([place]) 09/07/2019   Osteopenia 05/11/2017   Asthma, chronic, resolved 04/26/2012   Right middle lobe syndrome 04/26/2012    Otelia Limes, PTA 06/30/2021, 8:51  AM  [facility] @ [place], [place], [address] Phone: [phone]   Fax:  [phone]  Name: Catherine Long MRN: [id number] Date of Birth: 1951/03/17

## 2021-07-01 DIAGNOSIS — L570 Actinic keratosis: Secondary | ICD-10-CM | POA: Diagnosis not present

## 2021-07-01 DIAGNOSIS — Z85828 Personal history of other malignant neoplasm of skin: Secondary | ICD-10-CM | POA: Diagnosis not present

## 2021-07-01 DIAGNOSIS — H2513 Age-related nuclear cataract, bilateral: Secondary | ICD-10-CM | POA: Diagnosis not present

## 2021-07-01 DIAGNOSIS — H04123 Dry eye syndrome of bilateral lacrimal glands: Secondary | ICD-10-CM | POA: Diagnosis not present

## 2021-07-01 DIAGNOSIS — H43813 Vitreous degeneration, bilateral: Secondary | ICD-10-CM | POA: Diagnosis not present

## 2021-07-01 DIAGNOSIS — L814 Other melanin hyperpigmentation: Secondary | ICD-10-CM | POA: Diagnosis not present

## 2021-07-01 DIAGNOSIS — C44729 Squamous cell carcinoma of skin of left lower limb, including hip: Secondary | ICD-10-CM | POA: Diagnosis not present

## 2021-07-01 DIAGNOSIS — L821 Other seborrheic keratosis: Secondary | ICD-10-CM | POA: Diagnosis not present

## 2021-07-01 DIAGNOSIS — D485 Neoplasm of uncertain behavior of skin: Secondary | ICD-10-CM | POA: Diagnosis not present

## 2021-07-01 DIAGNOSIS — D1801 Hemangioma of skin and subcutaneous tissue: Secondary | ICD-10-CM | POA: Diagnosis not present

## 2021-07-17 DIAGNOSIS — C44729 Squamous cell carcinoma of skin of left lower limb, including hip: Secondary | ICD-10-CM | POA: Diagnosis not present

## 2021-07-17 DIAGNOSIS — L988 Other specified disorders of the skin and subcutaneous tissue: Secondary | ICD-10-CM | POA: Diagnosis not present

## 2021-07-20 IMAGING — US US  BREAST BX W/ LOC DEV 1ST LESION IMG BX SPEC US GUIDE*R*
1 series · 13 of 24 positions shown · non-contrast
Comparison: Previous exam(s).
COMPARISON: Previous exam(s).

Addendum:
CLINICAL DATA: Ultrasound-guided right core needle biopsy was
recommended of a suspicious mass at 2 o'clock position 2 cm from the
nipple.

EXAM:
ULTRASOUND GUIDED RIGHT BREAST CORE NEEDLE BIOPSY

[Series 1: us breast bx w/ loc dev 1st lesion img bx spec us  · 0.06mm/px · 13 of 24 slices shown]
[im 1/24]
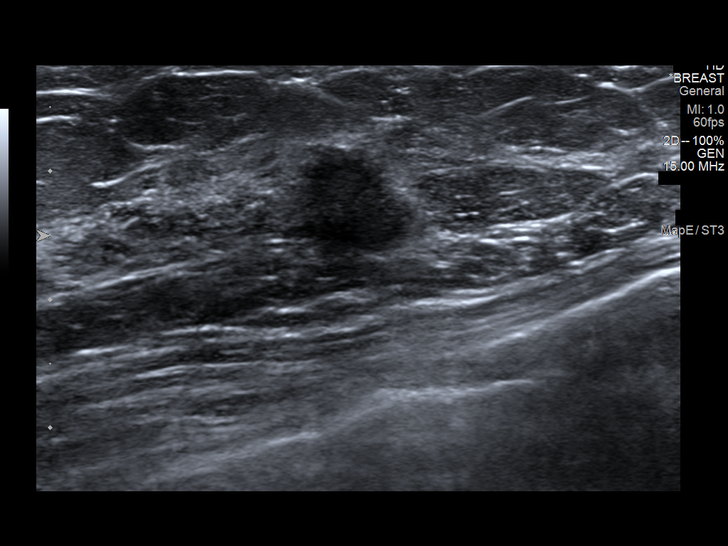
[im 3/24]
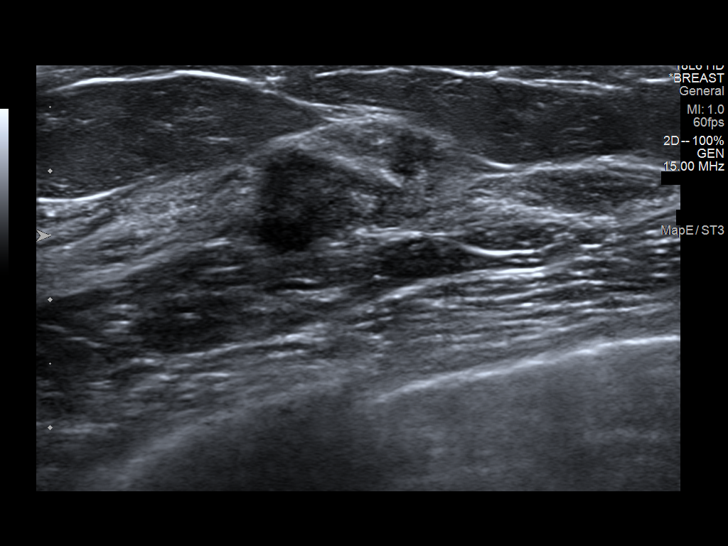
[im 5/24]
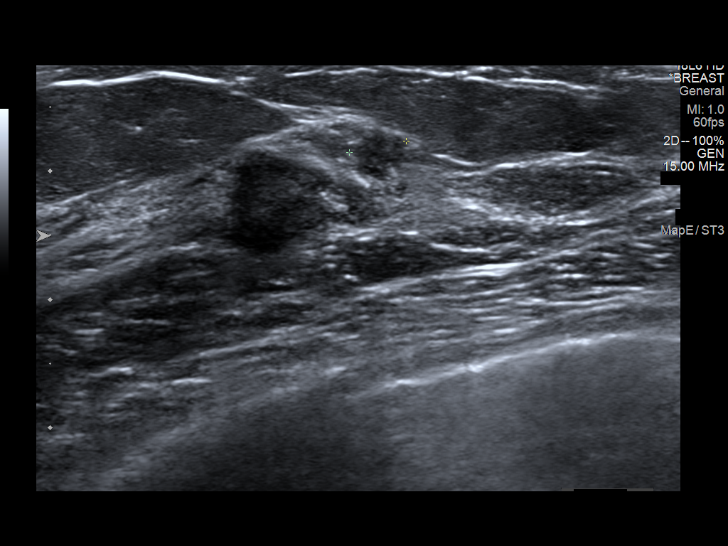
[im 7/24]
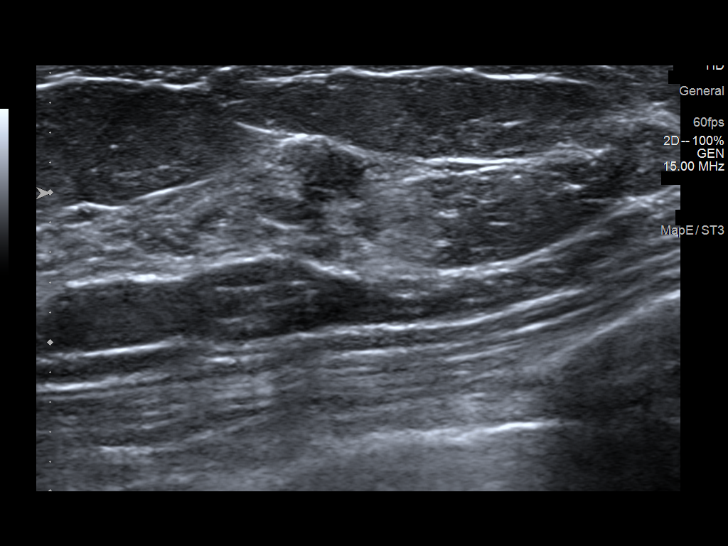
[im 9/24]
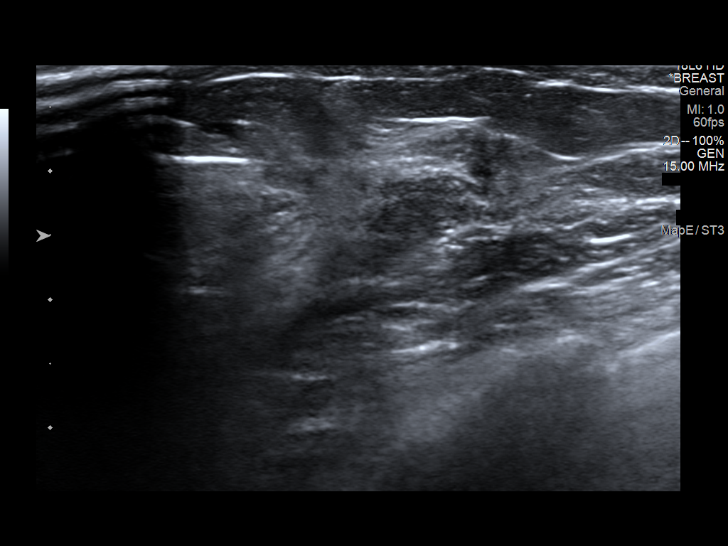
[im 11/24]
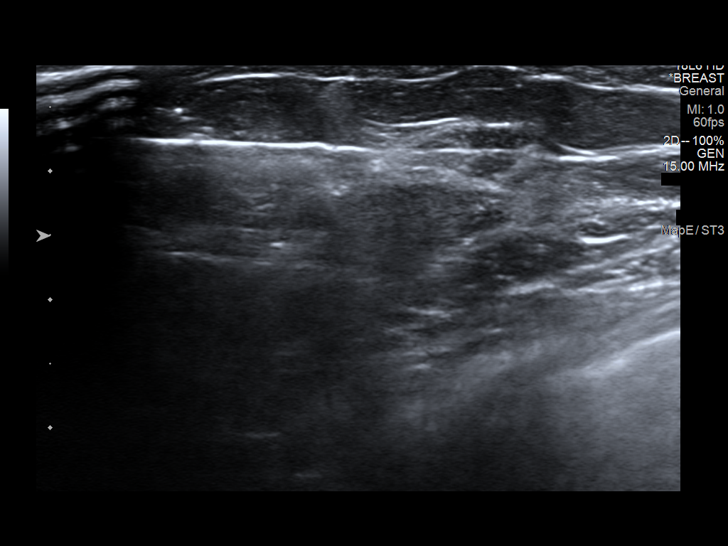
[im 13/24]
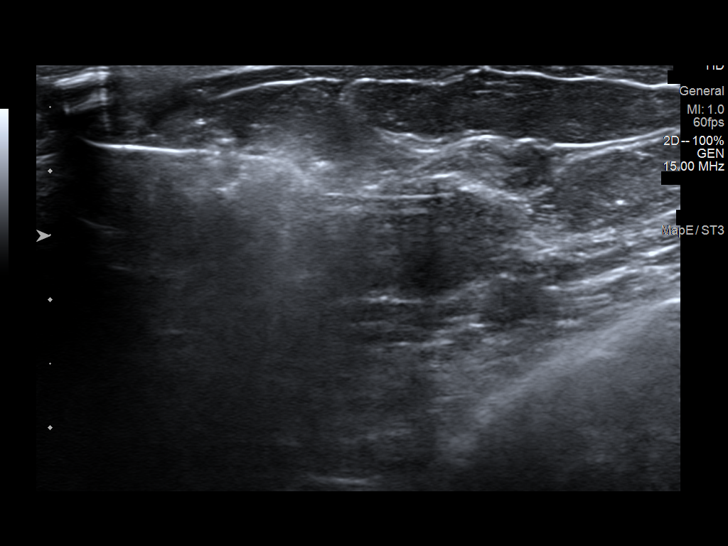
[im 14/24]
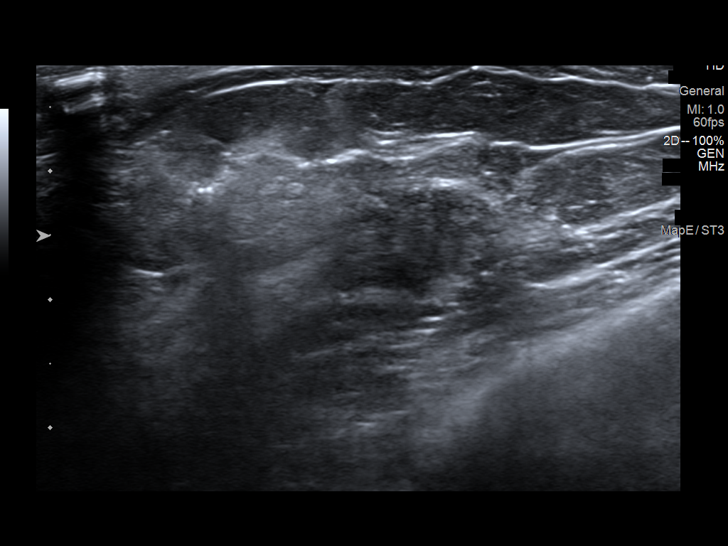
[im 16/24]
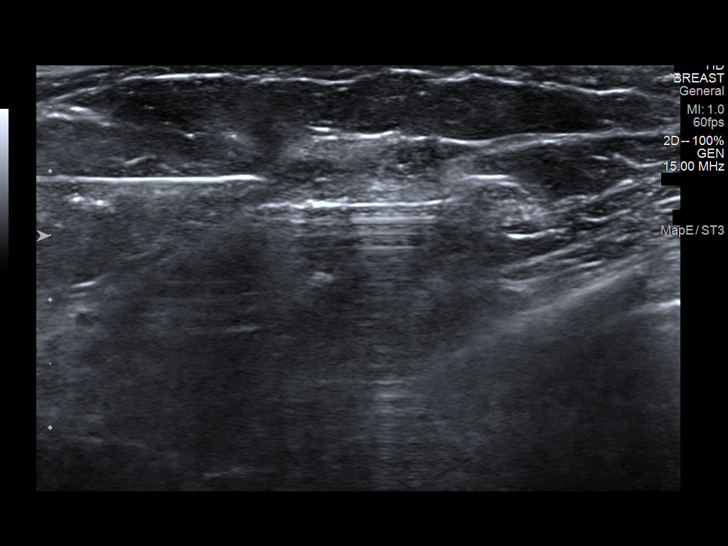
[im 18/24]
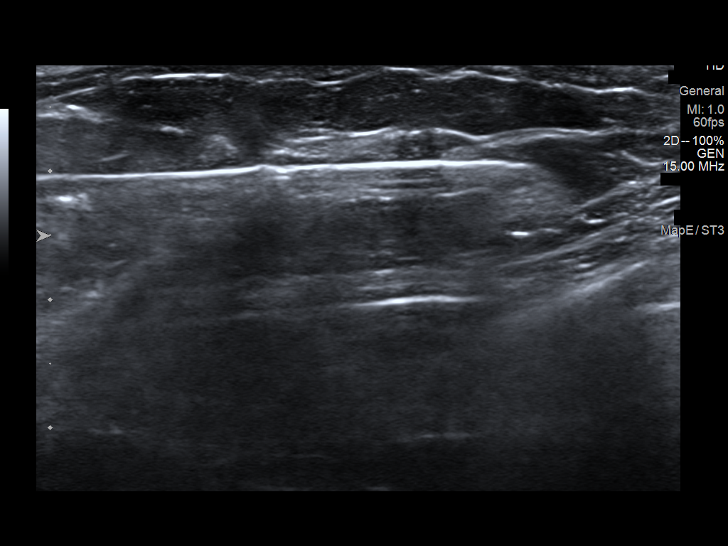
[im 20/24]
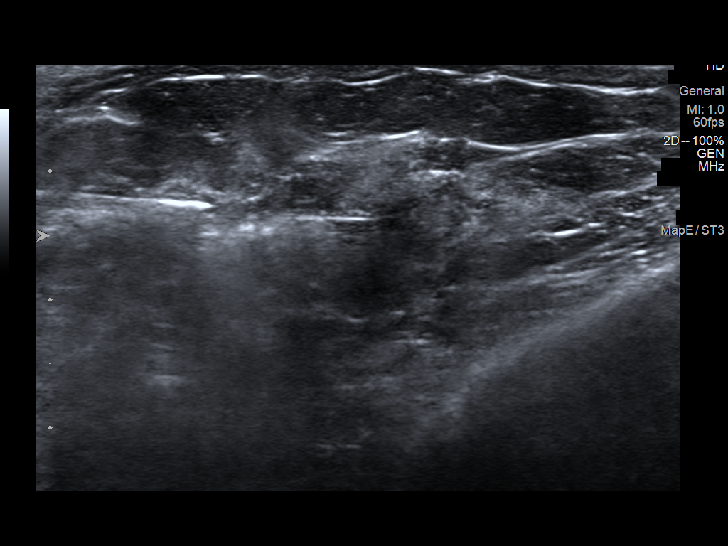
[im 22/24]
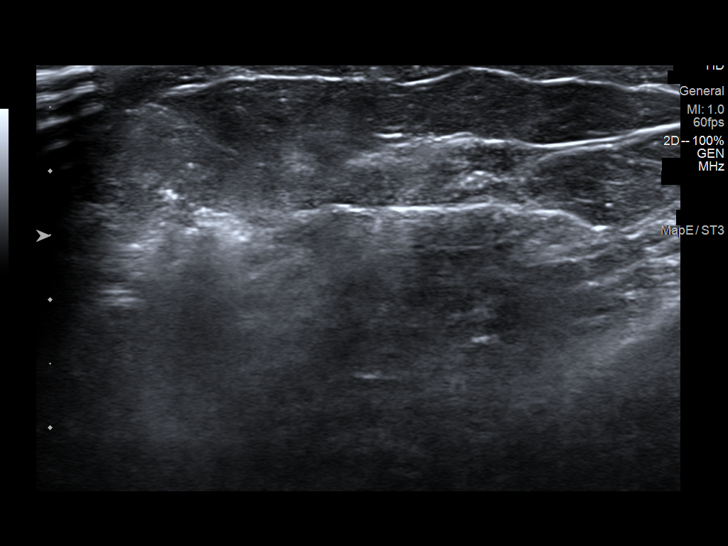
[im 24/24]
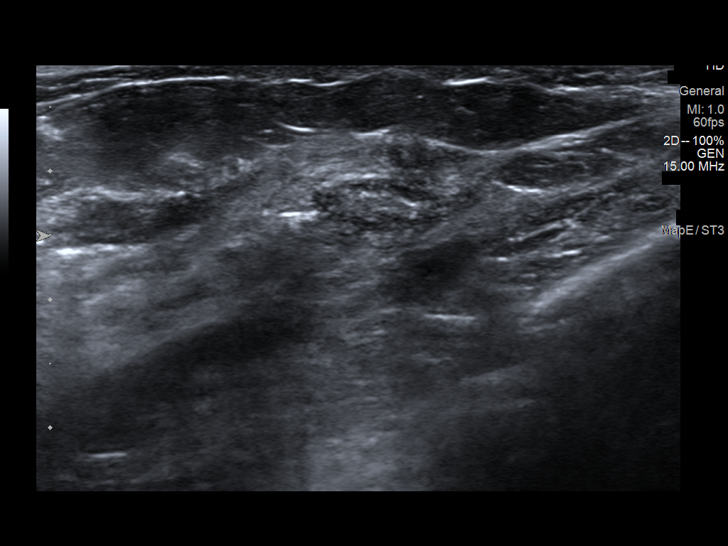

[13 of 24 positions shown; findings below may reference images not displayed]



Lesion quadrant: Upper inner quadrant

Using sterile technique and 1% Lidocaine as local anesthetic, under
direct ultrasound visualization, a 12 gauge Klever device was
used to perform biopsy of a suspicious 1.7 cm mass 2 o'clock
position right breast using a lateral approach. At the conclusion of
the procedure ribbon tissue marker clip was deployed into the biopsy
cavity. Follow up 2 view mammogram was performed and dictated
separately.
IMPRESSION: Ultrasound guided biopsy of the right breast. No apparent
complications.

ADDENDUM:
Pathology revealed GRADE II INVASIVE MAMMARY CARCINOMA of the Right
breast, 2 o'clock position, 4cmfn. This was found to be concordant
by Dr. Madzik Pojda.

Pathology results were discussed with the patient by telephone. The
patient reported doing well after the biopsy with tenderness at the
site. Post biopsy instructions and care were reviewed and questions
were answered. The patient was encouraged to call The [REDACTED]

The patient was referred to [REDACTED]
[REDACTED] at [REDACTED] on
September 12, 2018.

Pathology results reported by Jerome Eubanks, RN on 09/04/2019.



Lesion quadrant: Upper inner quadrant

Using sterile technique and 1% Lidocaine as local anesthetic, under
direct ultrasound visualization, a 12 gauge Klever device was
used to perform biopsy of a suspicious 1.7 cm mass 2 o'clock
position right breast using a lateral approach. At the conclusion of
the procedure ribbon tissue marker clip was deployed into the biopsy
cavity. Follow up 2 view mammogram was performed and dictated
separately.
IMPRESSION: Ultrasound guided biopsy of the right breast. No apparent
complications.

## 2021-07-30 DIAGNOSIS — L0889 Other specified local infections of the skin and subcutaneous tissue: Secondary | ICD-10-CM | POA: Diagnosis not present

## 2021-08-01 ENCOUNTER — Other Ambulatory Visit: Payer: Self-pay | Admitting: Hematology and Oncology

## 2021-08-01 DIAGNOSIS — Z853 Personal history of malignant neoplasm of breast: Secondary | ICD-10-CM

## 2021-08-01 DIAGNOSIS — D241 Benign neoplasm of right breast: Secondary | ICD-10-CM

## 2021-08-14 DIAGNOSIS — L821 Other seborrheic keratosis: Secondary | ICD-10-CM | POA: Diagnosis not present

## 2021-08-14 DIAGNOSIS — Z85828 Personal history of other malignant neoplasm of skin: Secondary | ICD-10-CM | POA: Diagnosis not present

## 2021-08-14 DIAGNOSIS — L97921 Non-pressure chronic ulcer of unspecified part of left lower leg limited to breakdown of skin: Secondary | ICD-10-CM | POA: Diagnosis not present

## 2021-09-04 DIAGNOSIS — M8588 Other specified disorders of bone density and structure, other site: Secondary | ICD-10-CM | POA: Diagnosis not present

## 2021-09-04 DIAGNOSIS — R29890 Loss of height: Secondary | ICD-10-CM | POA: Diagnosis not present

## 2021-09-04 DIAGNOSIS — Z8262 Family history of osteoporosis: Secondary | ICD-10-CM | POA: Diagnosis not present

## 2021-09-04 DIAGNOSIS — N958 Other specified menopausal and perimenopausal disorders: Secondary | ICD-10-CM | POA: Diagnosis not present

## 2021-09-12 ENCOUNTER — Ambulatory Visit
Admission: RE | Admit: 2021-09-12 | Discharge: 2021-09-12 | Disposition: A | Discharge status: Home / Self Care | Payer: Medicare PPO | Source: Ambulatory Visit | Attending: Hematology and Oncology | Admitting: Hematology and Oncology

## 2021-09-12 DIAGNOSIS — D241 Benign neoplasm of right breast: Secondary | ICD-10-CM

## 2021-09-12 DIAGNOSIS — R922 Inconclusive mammogram: Secondary | ICD-10-CM | POA: Diagnosis not present

## 2021-09-24 ENCOUNTER — Other Ambulatory Visit: Payer: Self-pay | Admitting: Hematology and Oncology

## 2021-09-25 DIAGNOSIS — C44622 Squamous cell carcinoma of skin of right upper limb, including shoulder: Secondary | ICD-10-CM | POA: Diagnosis not present

## 2021-09-29 ENCOUNTER — Ambulatory Visit: Payer: Medicare PPO

## 2021-09-29 NOTE — Progress Notes (Signed)
Patient Care Team: [name], MD as PCP - General (Family Medicine) [name] as Physician Assistant (Unknown Physician Specialty) [name], MD as Consulting Physician (Hematology and Oncology) [name], MD as Attending Physician (Radiation Oncology) [name], MD as Consulting Physician (General Surgery)  DIAGNOSIS:    ICD-10-CM   1. Malignant neoplasm of upper-inner quadrant of right breast in female, estrogen receptor positive ([place])  C50.211    Z17.0       SUMMARY OF ONCOLOGIC HISTORY: Oncology History  Malignant neoplasm of upper-inner quadrant of right breast in female, estrogen receptor positive ([place])  09/07/2019 Initial Diagnosis   Screening mammogram detected a possible right breast mass. Diagnostic mammogram showed two adjacent and nearly contiguous masses at the 2 o'clock position, 1.7cm, no right axillary adenopathy. Biopsy showed invasive mammary carcinoma, grade 2, HER-2 equivocal by IHC, negative by FISH, ER+ 95%, PR+ 90%, Ki67 2%.   09/19/2019 Genetic Testing   Negative genetic testing:  No pathogenic variants detected on the [facility] Breast Cancer STAT panel, Common Hereditary Cancers panel, or Melanoma panel. A variant of uncertain significance (VUS) was detected in the POLE gene called c.1357C>G. The report date is 09/19/2019.  UPDATE:  The POLE c.1357C>G VUS was reclassified to "likely benign" on 02/22/2020.  The STAT Breast cancer panel offered by [facility] includes sequencing and rearrangement analysis for the following 9 genes:  ATM, BRCA1, BRCA2, CDH1, CHEK2, PALB2, PTEN, STK11 and TP53. The Common Hereditary Cancers Panel offered by [facility] includes sequencing and/or deletion duplication testing of the following 48 genes: APC, ATM, AXIN2, BARD1, BMPR1A, BRCA1, BRCA2, BRIP1, CDH1, CDK4, CDKN2A (p14ARF), CDKN2A (p16INK4a), CHEK2, CTNNA1, DICER1, EPCAM (Deletion/duplication testing only), GREM1 (promoter region deletion/duplication testing  only), KIT, MEN1, MLH1, MSH2, MSH3, MSH6, MUTYH, NBN, NF1, NHTL1, PALB2, PDGFRA, PMS2, POLD1, POLE, PTEN, RAD50, RAD51C, RAD51D, RNF43, SDHB, SDHC, SDHD, SMAD4, SMARCA4. STK11, TP53, TSC1, TSC2, and VHL.  The following genes were evaluated for sequence changes only: SDHA and HOXB13 c.251G>A variant only. The Melanoma panel offered by [facility] includes sequencing and/or deletion duplication testing of the following 9 genes: BAP1, BRCA2, BRIP1, CDK4, CDKN2A (p14ARF), CDKN2A (p16INK4a), POT1, PTEN, RB1, and TP53.  The following gene was evaluated for sequence changes only: MITF (c.952G>A, p.Glu318Lys variant only).    09/27/2019 Surgery   Right lumpectomy [facility] [place]) [phone]): Grade 2 IDC 1.5 cm with intermediate grade DCIS, margins negative, 4 lymph nodes negative, ER 95%, PR 90%, HER-2 negative, Ki-67 2%   10/04/2019 Cancer Staging   Staging form: Breast, AJCC 8th Edition - Pathologic stage from 10/04/2019: Stage IA (pT1c, pN0, cM0, G2, ER+, PR+, HER2-)    10/11/2019 Oncotype testing   The Oncotype DX score was 9 predicting a risk of outside the breast recurrence over the next 9 years of 3% if the patient's only systemic therapy is tamoxifen for 5 years.    11/07/2019 - 12/04/2019 Radiation Therapy   The patient initially received a dose of 40.05 Gy in 15 fractions to the breast using whole-breast tangent fields. This was delivered using a 3-D conformal technique. The pt received a boost delivering an additional 10 Gy in 5 fractions using a electron boost with 54mV electrons. The total dose was 50.05 Gy.   12/2019 - 12/2024 Anti-estrogen oral therapy   Anastrozole     CHIEF COMPLIANT: Follow-up of right breast cancer on anastrozole   INTERVAL HISTORY: Catherine KLINKis a 71y.o. with above-mentioned history of right breast cancer who underwent a right  lumpectomy, radiation, and is currently on antiestrogen therapy with with anastrozole. Mammogram on 09/12/2021 showed no evidence of malignancy  bilaterally. She presents to the clinic today for follow-up.  She had a skin biopsy on her right dorsum of the hand and is awaiting the results.  She had a history of squamous cell skin cancer on the leg.  ALLERGIES:  is allergic to latex.  MEDICATIONS:  Current Outpatient Medications  Medication Sig Dispense Refill   anastrozole (ARIMIDEX) 1 MG tablet TAKE 1 TABLET EVERY DAY 90 tablet 3   ascorbic acid (VITAMIN C) 1000 MG tablet Take 1,000 mg by mouth daily.     B Complex-C (SUPER B COMPLEX) TABS Take 1 tablet by mouth daily.     Calcium Carbonate-Vitamin D3 (CALCIUM 600/VITAMIN D) 600-400 MG-UNIT TABS Take 2 tablets by mouth daily.     Multiple Vitamins-Minerals (MULTI COMPLETE/IRON PO) Take 1 tablet by mouth daily.     Turmeric 500 MG CAPS Take 500 mg by mouth daily.      No current facility-administered medications for this visit.    PHYSICAL EXAMINATION: ECOG PERFORMANCE STATUS: 1 - Symptomatic but completely ambulatory  Vitals:   09/30/21 0938  BP: (!) 132/53  Pulse: 82  Resp: 16  Temp: 97.9 F (36.6 C)  SpO2: 97%   Filed Weights   09/30/21 0938  Weight: 150 lb 1.6 oz (68.1 kg)    BREAST: No palpable masses or nodules in either right or left breasts. No palpable axillary supraclavicular or infraclavicular adenopathy no breast tenderness or nipple discharge. (exam performed in the presence of a chaperone)  LABORATORY DATA:  I have reviewed the data as listed CMP Latest Ref Rng & Units 05/12/2021 05/06/2020 09/13/2019  Glucose 65 - 99 mg/dL 93 88 101(H)  BUN 7 - 25 mg/dL _0 Creatinine 0.60 - 1.00 mg/dL 0.83 0.75 0.82  Sodium 135 - 146 mmol/L 139 140 142  Potassium 3.5 - 5.3 mmol/L 4.6 4.1 4.3  Chloride 98 - 110 mmol/L 102 103 104  CO2 20 - 32 mmol/L _1 Calcium 8.6 - 10.4 mg/dL 9.4 9.6 9.4  Total Protein 6.1 - 8.1 g/dL 6.7 6.7 7.8  Total Bilirubin 0.2 - 1.2 mg/dL 0.5 0.6 0.4  Alkaline Phos 38 - 126 U/L - - 62  AST 10 - 35 U/L _2 ALT 6 - 29 U/L  _3 Lab Results  Component Value Date   WBC 7.6 05/12/2021   HGB 14.4 05/12/2021   HCT 43.5 05/12/2021   MCV 89.0 05/12/2021   PLT 311 05/12/2021   NEUTROABS 4,636 05/12/2021    ASSESSMENT & PLAN:  Malignant neoplasm of upper-inner quadrant of right breast in female, estrogen receptor positive ([place]) 09/07/2019:Screening mammogram detected a possible right breast mass. Diagnostic mammogram showed two adjacent and nearly contiguous masses at the 2 o'clock position, 1.7cm, no right axillary adenopathy. Biopsy showed invasive mammary carcinoma, grade 2, HER-2 equivocal by IHC, negative by FISH, ER+ 95%, PR+ 90%, Ki67 2%. T1CN0 stage Ia clinical stage   Right lumpectomy 09/27/2019: Grade 2 IDC 1.5 cm with intermediate grade DCIS, margins negative, 0/4 lymph nodes negative, ER 95%, PR 90%, HER-2 negative, Ki-67 2% Oncotype score: 9, distant recurrence at 9 years: 3% Upbeat clinical trial: No adverse effects to participating in the study  Adjuvant radiation: 11/08/2019-12/04/2019    Recommendation: Adjuvant antiestrogen therapy with anastrozole 1 mg daily x5 to 7 years started 12/21/19  Anastrozole Toxicities: Denies any major adverse effects to anastrozole therapy.  Denies any hot flashes or arthralgias or myalgias. Bone density January 2021: T score -1.9: Osteopenia   Breast Cancer Surveillance: 1. Breast Exam: 09/30/21: Benign 2. Mammogram: 09/12/2021: Benign breast density category C. Biopsy 09/10/20: Fibroadenoma   RTC in 1 year for follow up      No orders of the defined types were placed in this encounter.  The patient has a good understanding of the overall plan. she agrees with it. she will call with any problems that may develop before the next visit here.  Total time spent: 20 mins including face to face time and time spent for planning, charting and coordination of care  Rulon Eisenmenger, MD, MPH 09/30/2021  I, Thana Ates, am acting as scribe for Dr. [name].  I have reviewed the above documentation for accuracy and completeness, and I agree with the above.

## 2021-09-30 ENCOUNTER — Inpatient Hospital Stay: Payer: Medicare PPO | Attending: Hematology and Oncology | Admitting: Hematology and Oncology

## 2021-09-30 ENCOUNTER — Other Ambulatory Visit: Payer: Self-pay

## 2021-09-30 DIAGNOSIS — Z79811 Long term (current) use of aromatase inhibitors: Secondary | ICD-10-CM | POA: Diagnosis not present

## 2021-09-30 DIAGNOSIS — Z170 Estrogen receptor positive status [ER+]: Secondary | ICD-10-CM | POA: Insufficient documentation

## 2021-09-30 DIAGNOSIS — C50211 Malignant neoplasm of upper-inner quadrant of right female breast: Secondary | ICD-10-CM | POA: Diagnosis not present

## 2021-09-30 NOTE — Assessment & Plan Note (Signed)
09/07/2019:Screening mammogram detected a possible right breast mass. Diagnostic mammogram showed two adjacent and nearly contiguous masses at the 2 o'clock position, 1.7cm, no right axillary adenopathy. Biopsy showed invasive mammary carcinoma, grade 2, HER-2 equivocal by IHC, negative by FISH, ER+ 95%, PR+ 90%, Ki67 2%. T1CN0 stage Ia clinical stage  Right lumpectomy 09/27/2019: Grade 2 IDC 1.5 cm with intermediate grade DCIS, margins negative, 0/4 lymph nodes negative, ER 95%, PR 90%, HER-2 negative, Ki-67 2%Oncotype score: 9, distant recurrence at 9 years: 3%  Adjuvant radiation: 11/08/2019-12/04/2019  Recommendation:Adjuvant antiestrogen therapywith anastrozole 1 mg daily x5 to 7 yearsstarted 12/21/19  Anastrozole Toxicities: Denies any major adverse effects to anastrozole therapy.  Denies any hot flashes or arthralgias or myalgias. Bone density January 2021: T score -1.9: Osteopenia  Breast Cancer Surveillance: 1. Breast Exam: 09/30/21: Benign 2. Mammogram: 09/12/2021: Benign breast density category C. Biopsy 09/10/20: Fibroadenoma  RTC in 1 year for follow up

## 2021-10-27 ENCOUNTER — Other Ambulatory Visit: Payer: Self-pay

## 2021-10-27 ENCOUNTER — Ambulatory Visit: Payer: Medicare PPO | Attending: Adult Health

## 2021-10-27 VITALS — Wt 152.1 lb

## 2021-10-27 DIAGNOSIS — Z483 Aftercare following surgery for neoplasm: Secondary | ICD-10-CM | POA: Insufficient documentation

## 2021-10-27 NOTE — Therapy (Signed)
?OUTPATIENT PHYSICAL THERAPY SOZO SCREENING NOTE ? ? ?Patient Name: Catherine Long ?MRN: [id number] ?DOB:May 24, 1951, 71 y.o., female ?Today's Date: 10/27/2021 ? ?PCP: [name], MD ?REFERRING PROVIDER: Deanne Coffer* ? ? PT End of Session - 10/27/21 8185   ? ? Visit Number 3   # unchanged due to screen only  ? PT Start Time [phone]   ? PT Stop Time [phone]   ? PT Time Calculation (min) 6 min   ? Activity Tolerance Patient tolerated treatment well   ? Behavior During Therapy [facility] for tasks assessed/performed   ? ?  ?  ? ?  ? ? ?Past Medical History:  ?Diagnosis Date  ? Asthma   ? several years ago  ? Cancer [facility])   ? Phreesia 05/11/2020  ? Family history of breast cancer   ? Family history of melanoma   ? Family history of ovarian cancer   ? History of melanoma   ? Melanoma ([place])   ? PONV (postoperative nausea and vomiting)   ? Right middle lobe syndrome   ? ?Past Surgical History:  ?Procedure Laterality Date  ? BREAST SURGERY N/A   ? Phreesia 05/11/2020  ? JOINT REPLACEMENT N/A   ? Phreesia 05/11/2020  ? RADIOACTIVE SEED GUIDED PARTIAL MASTECTOMY WITH AXILLARY SENTINEL LYMPH NODE BIOPSY Right 09/27/2019  ? Procedure: RIGHT RADIOACTIVE SEED GUIDED LUMPECTOMY WITH  SENTINEL LYMPH NODE BIOPSY;  Surgeon: [name], MD;  Location: [facility];  Service: General;  Laterality: Right;  LMA  ? TONSILLECTOMY    ? TOTAL HIP ARTHROPLASTY  2003 and 2006  ? Bilateral  ? ?Patient Active Problem List  ? Diagnosis Date Noted  ? Genetic testing 09/20/2019  ? History of melanoma   ? Family history of breast cancer   ? Family history of ovarian cancer   ? Family history of melanoma   ? Malignant neoplasm of upper-inner quadrant of right breast in female, estrogen receptor positive ([place]) 09/07/2019  ? Osteopenia 05/11/2017  ? Asthma, chronic, resolved 04/26/2012  ? Right middle lobe syndrome 04/26/2012  ? ? ?REFERRING DIAG: right breast cancer at risk for lymphedema ? ?THERAPY DIAG:  ?Aftercare following surgery for  neoplasm ? ?PERTINENT HISTORY: Patient was diagnosed on 08/14/2019 with right grade II invasive ductal carcinoma breast cancer. Patient underwent a right lumpectomy and sentinel node biopsy (4 negative nodes removed) on 09/27/2019. It is ER/PR positive and HER2 negative with a Ki67 of 2%. She has had bilateral hip replacements, right in 2003 and left in 2006. She also has a history of right arm melanoma in 1975. ? ?PRECAUTIONS: right UE Lymphedema risk, None ? ?SUBJECTIVE: Pt returns for her 3 month L-Dex screen.  ? ?PAIN:  ?Are you having pain? No ? ?SOZO SCREENING: ?Patient was assessed today using the SOZO machine to determine the lymphedema index score. This was compared to her baseline score. It was determined that she is within the recommended range when compared to her baseline and no further action is needed at this time. She will continue SOZO screenings. These are done every 3 months for 2 years post operatively followed by every 6 months for 2 years, and then annually. Pt has reached 2 years and requests not to return due to her consistently low scores. She knows she can call to return though prn after a flight or is she notices any limb changes. She verbalizes understanding all.  ? ?Plan: Pt would like to be placed on hold from SOZO screens at this  point as she is 2 years out from her surgery and her scores have maintained very well. She knows she can call this clinic if she needs to resume. ? ? ?Otelia Limes, PTA ?10/27/2021, 9:31 AM ? ?  ? ?

## 2021-11-20 ENCOUNTER — Other Ambulatory Visit (HOSPITAL_COMMUNITY): Payer: Self-pay | Admitting: Hematology and Oncology

## 2021-11-20 ENCOUNTER — Other Ambulatory Visit: Payer: Self-pay | Admitting: *Deleted

## 2021-11-20 DIAGNOSIS — C50211 Malignant neoplasm of upper-inner quadrant of right female breast: Secondary | ICD-10-CM

## 2021-11-20 DIAGNOSIS — C50919 Malignant neoplasm of unspecified site of unspecified female breast: Secondary | ICD-10-CM

## 2021-11-21 ENCOUNTER — Other Ambulatory Visit: Payer: Medicare PPO

## 2021-11-21 ENCOUNTER — Encounter: Payer: Medicare PPO | Admitting: *Deleted

## 2021-12-18 ENCOUNTER — Telehealth: Payer: Self-pay | Admitting: Oncology

## 2021-12-18 NOTE — Telephone Encounter (Signed)
12/18/21 - Upbeat 24 months visit - Called and spoke to the patient on the phone.  Called to remind the patient that tomorrow morning at 8 am she would be having her research blood drawn and to be fasting for 3 hours prior to that. She can bring a snack to eat afterwards.  She needs to wear comfortable clothes for the rest of the study procedures after her MRI.  She said that she had already received the MRI reminder call.  I thanked the patient and told her we would see her tomorrow morning. Remer Macho 12/18/21 - 9:30 am

## 2021-12-19 ENCOUNTER — Ambulatory Visit (HOSPITAL_COMMUNITY)
Admission: RE | Admit: 2021-12-19 | Discharge: 2021-12-19 | Disposition: A | Discharge status: Home / Self Care | Payer: Medicare PPO | Source: Ambulatory Visit | Attending: Hematology and Oncology | Admitting: Hematology and Oncology

## 2021-12-19 ENCOUNTER — Other Ambulatory Visit: Payer: Self-pay

## 2021-12-19 ENCOUNTER — Inpatient Hospital Stay: Payer: Medicare PPO | Admitting: *Deleted

## 2021-12-19 ENCOUNTER — Encounter: Payer: Self-pay | Admitting: *Deleted

## 2021-12-19 ENCOUNTER — Inpatient Hospital Stay: Payer: Medicare PPO

## 2021-12-19 ENCOUNTER — Inpatient Hospital Stay: Payer: Medicare PPO | Attending: Hematology and Oncology

## 2021-12-19 DIAGNOSIS — C50919 Malignant neoplasm of unspecified site of unspecified female breast: Secondary | ICD-10-CM

## 2021-12-19 DIAGNOSIS — Z170 Estrogen receptor positive status [ER+]: Secondary | ICD-10-CM

## 2021-12-19 LAB — RESEARCH LABS

## 2021-12-19 NOTE — Research (Signed)
WF [id number] Understanding and Predicting Breast Cancer Events after Treatment (UPBEAT)   12/19/2021- Patient Catherine Long [facility] Unaccompanied for her UpBeat 24 month visit.    PROs: All PROs (Self-administered questionnaire, Covid-19 questionnaire, the [facility] Cardiomyopathy questionnaire, and the Social Determinants of Health) required for this visit were completed on 12/13/21 prior to this visit at the pt's home.  The research nurse checked for completeness.    LABS:   The month 33 [facility] research samples were collected this morning per consent and study protocol.  The pt confirmed that she had fasted for over 3 hours prior to her lab appt this morning.  Patient Catherine Long tolerated the lab collection well without complaint.  The pt was informed that she will be contacted with her lab results when they are available to the site.  The pt verbalized understanding.  The pt's serum and plasma will be shipped out Monday, 12/22/21.     MEDICATION REVIEW: Patient reviewed and verified that her current medication list is correct.  The pt states she is only taking anastrozole daily and vitamins/supplements.  She specifically denies taking any cardiovascular medications.    VITAL SIGNS: Vital signs were collected per study protocol.  The pt was seated comfortably for 5 minutes before obtaining the pt's readings.  The pt's waist measurement was 36 inches.    ADVERSE EVENTS: Patient Catherine Long denies any hospitalizations since her last visit.  The pt specifically denies the following conditions: heart attack, angioplasty, bypass operation, heart catheterization, stroke and heart failure.     CARDIAC MRI: The pt completed her month 24 cardiac MRI this morning.  The pt was told that she will be notified of her results once [facility] sends Dr. Lindi Adie the MRI Clinical Report.    NEUROCOGNITIVE ASSESSMENT: Neurocognitive assessment completed by Farris Has, research assistant.    PHYSICAL TESTING: Physical  tests completed by this clinical research Nurse with the assistance of clinical research Coordinator, Clabe Seal.   GIFT CARD: This study does provide visit compensation, and the pt was given her $25 gift card once all of her study assessments were completed.    The patient was thanked for completing 2 years on the study. The pt was also thanked for her continued support in this study. The pt was told that she will now enter the follow up phase of the study for years 3-11.  The pt knows that she will be contacted in May 2024 for her year 3 follow up call.  Patient Catherine Long has been provided direct contact information and is encouraged to contact this Nurse for any needs or questions.  Brion Aliment RN, BSN, CCRP Clinical Research Nurse Lead 12/19/2021 11:28 AM

## 2021-12-22 ENCOUNTER — Telehealth: Payer: Self-pay | Admitting: *Deleted

## 2021-12-22 NOTE — Telephone Encounter (Signed)
WF [id number] Understanding and Predicting Breast Cancer Events after Treatment (UPBEAT)    The research nurse received the pt's month 24 UPBEAT research lab results by fax this morning.  Dr. Lindi Adie reviewed the results.  The pt's labs values were all within normal limits. The nurse called the pt and informed her of her values over the phone. The pt was delighted to hear that her results were all normal. The pt requested that the nurse email her a copy of her results for her records.  The pt was informed that her cardiac MRI results are still pending, and she will be notified once we receive the MRI Clinical Report from [facility]. The pt verbalized understanding. Brion Aliment RN, BSN, CCRP Clinical Research Nurse Lead 12/22/2021 2:48 PM

## 2021-12-31 ENCOUNTER — Telehealth: Payer: Self-pay | Admitting: *Deleted

## 2021-12-31 DIAGNOSIS — C50211 Malignant neoplasm of upper-inner quadrant of right female breast: Secondary | ICD-10-CM

## 2021-12-31 NOTE — Telephone Encounter (Signed)
[id number] Understanding and Predicting Breast Cancer Events after Treatment (UPBEAT)   Research nurse received this pt's [facility] Clinical Report by email this morning.  The report was taken directly to Dr. Lindi Adie for review.  MD signed and dated the report.  The Conclusion for the Research Cardiac MRI was "Negative for Alert Findings".  The nurse then called and spoke to the pt at 2:50 pm about her cardiac MRI results.  The pt was informed the following results: LV Wall Motion - Within Normal Limits Global Left Ventricular Function (Ejection Fraction)- Non-Alert Value Left Ventricular Hypertrophy- Non-Alert Value Aorta- Size Within Normal Limits   The pt requested that the research department email her a copy of her report for her records.    The pt was thanked again for her participation in this study.  The pt knows that she is in the follow up phase of the study now.  She will be contacted by the study staff in May 2024 for her first follow up visit.    Brion Aliment RN, BSN, CCRP Clinical Research Nurse Lead 12/31/2021 2:58 PM

## 2022-01-15 DIAGNOSIS — L821 Other seborrheic keratosis: Secondary | ICD-10-CM | POA: Diagnosis not present

## 2022-01-15 DIAGNOSIS — L570 Actinic keratosis: Secondary | ICD-10-CM | POA: Diagnosis not present

## 2022-01-15 DIAGNOSIS — L814 Other melanin hyperpigmentation: Secondary | ICD-10-CM | POA: Diagnosis not present

## 2022-01-15 DIAGNOSIS — Z85828 Personal history of other malignant neoplasm of skin: Secondary | ICD-10-CM | POA: Diagnosis not present

## 2022-05-18 ENCOUNTER — Other Ambulatory Visit: Payer: Medicare PPO

## 2022-05-18 DIAGNOSIS — Z136 Encounter for screening for cardiovascular disorders: Secondary | ICD-10-CM | POA: Diagnosis not present

## 2022-05-18 DIAGNOSIS — J9819 Other pulmonary collapse: Secondary | ICD-10-CM | POA: Diagnosis not present

## 2022-05-18 DIAGNOSIS — Z13220 Encounter for screening for lipoid disorders: Secondary | ICD-10-CM

## 2022-05-18 DIAGNOSIS — J45909 Unspecified asthma, uncomplicated: Secondary | ICD-10-CM

## 2022-05-19 LAB — CBC WITH DIFFERENTIAL/PLATELET
Absolute Monocytes: 377 cells/uL (ref 200–950)
Basophils Absolute: 67 cells/uL (ref 0–200)
Basophils Relative: 0.9 %
Eosinophils Absolute: 96 cells/uL (ref 15–500)
Eosinophils Relative: 1.3 %
HCT: 38.2 % (ref 35.0–45.0)
Hemoglobin: 12.7 g/dL (ref 11.7–15.5)
Lymphs Abs: 1894 cells/uL (ref 850–3900)
MCH: 27.7 pg (ref 27.0–33.0)
MCHC: 33.2 g/dL (ref 32.0–36.0)
MCV: 83.4 fL (ref 80.0–100.0)
MPV: 9.6 fL (ref 7.5–12.5)
Monocytes Relative: 5.1 %
Neutro Abs: 4965 cells/uL (ref 1500–7800)
Neutrophils Relative %: 67.1 %
Platelets: 341 10*3/uL (ref 140–400)
RBC: 4.58 10*6/uL (ref 3.80–5.10)
RDW: 13.4 % (ref 11.0–15.0)
Total Lymphocyte: 25.6 %
WBC: 7.4 10*3/uL (ref 3.8–10.8)

## 2022-05-19 LAB — COMPREHENSIVE METABOLIC PANEL
AG Ratio: 1.6 (calc) (ref 1.0–2.5)
ALT: 12 U/L (ref 6–29)
AST: 19 U/L (ref 10–35)
Albumin: 4.2 g/dL (ref 3.6–5.1)
Alkaline phosphatase (APISO): 70 U/L (ref 37–153)
BUN: 9 mg/dL (ref 7–25)
CO2: 28 mmol/L (ref 20–32)
Calcium: 9.5 mg/dL (ref 8.6–10.4)
Chloride: 102 mmol/L (ref 98–110)
Creat: 0.71 mg/dL (ref 0.60–1.00)
Globulin: 2.7 g/dL (calc) (ref 1.9–3.7)
Glucose, Bld: 90 mg/dL (ref 65–99)
Potassium: 4.5 mmol/L (ref 3.5–5.3)
Sodium: 138 mmol/L (ref 135–146)
Total Bilirubin: 0.3 mg/dL (ref 0.2–1.2)
Total Protein: 6.9 g/dL (ref 6.1–8.1)

## 2022-05-19 LAB — LIPID PANEL
Cholesterol: 157 mg/dL (ref <200)
HDL: 71 mg/dL (ref >=50)
LDL Cholesterol (Calc): 66 mg/dL (calc)
Non-HDL Cholesterol (Calc): 86 mg/dL (calc) (ref <130)
Total CHOL/HDL Ratio: 2.2 (calc) (ref <5.0)
Triglycerides: 123 mg/dL (ref <150)

## 2022-05-25 ENCOUNTER — Ambulatory Visit (INDEPENDENT_AMBULATORY_CARE_PROVIDER_SITE_OTHER): Payer: Medicare PPO | Admitting: Family Medicine

## 2022-05-25 VITALS — BP 118/70 | HR 70 | Ht 64.5 in | Wt 155.8 lb

## 2022-05-25 DIAGNOSIS — Z0000 Encounter for general adult medical examination without abnormal findings: Secondary | ICD-10-CM | POA: Diagnosis not present

## 2022-05-25 DIAGNOSIS — R0600 Dyspnea, unspecified: Secondary | ICD-10-CM | POA: Diagnosis not present

## 2022-05-25 DIAGNOSIS — M810 Age-related osteoporosis without current pathological fracture: Secondary | ICD-10-CM | POA: Diagnosis not present

## 2022-05-25 DIAGNOSIS — Z853 Personal history of malignant neoplasm of breast: Secondary | ICD-10-CM | POA: Diagnosis not present

## 2022-05-25 DIAGNOSIS — Z23 Encounter for immunization: Secondary | ICD-10-CM | POA: Diagnosis not present

## 2022-05-25 DIAGNOSIS — Z85820 Personal history of malignant melanoma of skin: Secondary | ICD-10-CM

## 2022-05-25 MED ORDER — ALENDRONATE SODIUM 70 MG PO TABS: 70.0000 mg | ORAL_TABLET | ORAL | 3 refills | Status: DC

## 2022-05-25 NOTE — Progress Notes (Signed)
Subjective:    Patient ID: Catherine Long, female    DOB: [birth year], 71 y.o.   MRN: [id number]  HPI Patient is here today for complete physical exam.  Has a history of breast cancer/DCIS in 2021. Immunization History  Administered Date(s) Administered   Fluad Quad(high Dose 65+) 06/20/2019, 05/14/2020, 05/16/2021   Influenza Split 04/26/2012   Influenza, High Dose Seasonal PF 05/20/2017, 05/23/2018   Influenza,inj,Quad PF,6+ Mos 06/01/2013, 05/28/2014, 05/15/2016   PFIZER(Purple Top)SARS-COV-2 Vaccination 09/10/2019, 10/05/2019   Pneumococcal Conjugate-13 05/15/2016   Pneumococcal Polysaccharide-23 04/26/2012, 05/23/2018   Zoster Recombinat (Shingrix) 10/05/2017, 12/26/2017   Zoster, Live 05/30/2014  Recently developed mouth.  She attributes this to age.  She states that with vigorous activity she finds herself more easily winded.  She denies any angina or chest pain.  She recently went to [employer] with her family and she was able to walk 14,000 steps a day without any angina however she is found herself more easily winded.  Otherwise she is doing well.  She is not currently on any therapy for osteoporosis.  She is due for a flu shot.  She recently had a COVID booster.  Mammogram 2/23- normal DEXA 2/23- osteoporosis with T score -2.7 Colonoscopy 2016 -2 polyps  Her most recent lab work as listed below and is excellent: Lab on 05/18/2022  Component Date Value Ref Range Status   WBC 05/18/2022 7.4  3.8 - 10.8 Thousand/uL Final   RBC 05/18/2022 4.58  3.80 - 5.10 Million/uL Final   Hemoglobin 05/18/2022 12.7  11.7 - 15.5 g/dL Final   HCT 05/18/2022 38.2  35.0 - 45.0 % Final   MCV 05/18/2022 83.4  80.0 - 100.0 fL Final   MCH 05/18/2022 27.7  27.0 - 33.0 pg Final   MCHC 05/18/2022 33.2  32.0 - 36.0 g/dL Final   RDW 05/18/2022 13.4  11.0 - 15.0 % Final   Platelets 05/18/2022 341  140 - 400 Thousand/uL Final   MPV 05/18/2022 9.6  7.5 - 12.5 fL Final   Neutro Abs 05/18/2022 4,965   1,500 - 7,800 cells/uL Final   Lymphs Abs 05/18/2022 1,894  850 - 3,900 cells/uL Final   Absolute Monocytes 05/18/2022 377  200 - 950 cells/uL Final   Eosinophils Absolute 05/18/2022 96  15 - 500 cells/uL Final   Basophils Absolute 05/18/2022 67  0 - 200 cells/uL Final   Neutrophils Relative % 05/18/2022 67.1  % Final   Total Lymphocyte 05/18/2022 25.6  % Final   Monocytes Relative 05/18/2022 5.1  % Final   Eosinophils Relative 05/18/2022 1.3  % Final   Basophils Relative 05/18/2022 0.9  % Final   Glucose, Bld 05/18/2022 90  65 - 99 mg/dL Final   Comment: .            Fasting reference interval .    BUN 05/18/2022 9  7 - 25 mg/dL Final   Creat 05/18/2022 0.71  0.60 - 1.00 mg/dL Final   BUN/Creatinine Ratio 05/18/2022 SEE NOTE:  6 - 22 (calc) Final   Comment:    Not Reported: BUN and Creatinine are within    reference range. .    Sodium 05/18/2022 138  135 - 146 mmol/L Final   Potassium 05/18/2022 4.5  3.5 - 5.3 mmol/L Final   Chloride 05/18/2022 102  98 - 110 mmol/L Final   CO2 05/18/2022 28  20 - 32 mmol/L Final   Calcium 05/18/2022 9.5  8.6 - 10.4 mg/dL Final   Total  Protein 05/18/2022 6.9  6.1 - 8.1 g/dL Final   Albumin 05/18/2022 4.2  3.6 - 5.1 g/dL Final   Globulin 05/18/2022 2.7  1.9 - 3.7 g/dL (calc) Final   AG Ratio 05/18/2022 1.6  1.0 - 2.5 (calc) Final   Total Bilirubin 05/18/2022 0.3  0.2 - 1.2 mg/dL Final   Alkaline phosphatase (APISO) 05/18/2022 70  37 - 153 U/L Final   AST 05/18/2022 19  10 - 35 U/L Final   ALT 05/18/2022 12  6 - 29 U/L Final   Cholesterol 05/18/2022 157  <200 mg/dL Final   HDL 05/18/2022 71  > OR = 50 mg/dL Final   Triglycerides 05/18/2022 123  <150 mg/dL Final   LDL Cholesterol (Calc) 05/18/2022 66  mg/dL (calc) Final   Comment: Reference range: <100 . Desirable range <100 mg/dL for primary prevention;   <70 mg/dL for patients with CHD or diabetic patients  with > or = 2 CHD risk factors. Marland Kitchen LDL-C is now calculated using the Martin-Hopkins   calculation, which is a validated novel method providing  better accuracy than the Friedewald equation in the  estimation of LDL-C.  Cresenciano Genre et al. Annamaria Helling. 9379;024(09): 2061-2068  (http://education.[employer].com/faq/FAQ164)    Total CHOL/HDL Ratio 05/18/2022 2.2  <5.0 (calc) Final   Non-HDL Cholesterol (Calc) 05/18/2022 86  <130 mg/dL (calc) Final   Comment: For patients with diabetes plus 1 major ASCVD risk  factor, treating to a non-HDL-C goal of <100 mg/dL  (LDL-C of <70 mg/dL) is considered a therapeutic  option.     Past Medical History:  Diagnosis Date   Asthma    several years ago   Cancer [facility])    Phreesia 05/11/2020   Family history of breast cancer    Family history of melanoma    Family history of ovarian cancer    History of melanoma    Melanoma ([place])    PONV (postoperative nausea and vomiting)    Right middle lobe syndrome    Past Surgical History:  Procedure Laterality Date   BREAST SURGERY N/A    Phreesia 05/11/2020   JOINT REPLACEMENT N/A    Phreesia 05/11/2020   RADIOACTIVE SEED GUIDED PARTIAL MASTECTOMY WITH AXILLARY SENTINEL LYMPH NODE BIOPSY Right 09/27/2019   Procedure: RIGHT RADIOACTIVE SEED GUIDED LUMPECTOMY WITH  SENTINEL LYMPH NODE BIOPSY;  Surgeon: [name], MD;  Location: [place];  Service: General;  Laterality: Right;  [place]  2003 and 2006   Bilateral   Current Outpatient Medications on File Prior to Visit  Medication Sig Dispense Refill   anastrozole (ARIMIDEX) 1 MG tablet TAKE 1 TABLET EVERY DAY 90 tablet 3   ascorbic acid (VITAMIN C) 1000 MG tablet Take 1,000 mg by mouth daily.     B Complex-C (SUPER B COMPLEX) TABS Take 1 tablet by mouth daily.     Calcium Carbonate-Vitamin D3 (CALCIUM 600/VITAMIN D) 600-400 MG-UNIT TABS Take 2 tablets by mouth daily.     Multiple Vitamins-Minerals (MULTI COMPLETE/IRON PO) Take 1 tablet by mouth daily.     Turmeric 500 MG CAPS Take 500 mg by mouth  daily.      No current facility-administered medications on file prior to visit.   Allergies  Allergen Reactions   Latex Other (See Comments), Rash and Dermatitis   Social History   Socioeconomic History   Marital status: Widowed    Spouse name: Not on file   Number of children: 2  Years of education: Not on file   Highest education level: Not on file  Occupational History   Occupation: Retired  Tobacco Use   Smoking status: Never   Smokeless tobacco: Never  Vaping Use   Vaping Use: Never used  Substance and Sexual Activity   Alcohol use: Yes    Alcohol/week: 3.0 - 4.0 standard drinks of alcohol    Types: 3 - 4 Glasses of wine per week    Comment: Socially   Drug use: No   Sexual activity: Not on file  Other Topics Concern   Not on file  Social History Narrative   Not on file   Social Determinants of Health   Financial Resource Strain: Not on file  Food Insecurity: Not on file  Transportation Needs: Not on file  Physical Activity: Not on file  Stress: Not on file  Social Connections: Not on file  Intimate Partner Violence: Not on file   Family History  Problem Relation Age of Onset   Heart disease Father    Melanoma Father        dx. 41s   Breast cancer Mother        bilateral, dx. mid-50s   Melanoma Mother        dx. 55s   Melanoma Sister        dx. 88s   Breast cancer Maternal Aunt        dx. >50   Ovarian cancer Maternal Aunt        dx. >50      Review of Systems  All other systems reviewed and are negative.      Objective:   Physical Exam Vitals reviewed.  Constitutional:      General: She is not in acute distress.    Appearance: She is well-developed. She is not diaphoretic.  HENT:     Head: Normocephalic and atraumatic.     Right Ear: External ear normal.     Left Ear: External ear normal.     Nose: Nose normal.     Mouth/Throat:     Pharynx: No oropharyngeal exudate.  Eyes:     General: No scleral icterus.       Right eye: No  discharge.        Left eye: No discharge.     Conjunctiva/sclera: Conjunctivae normal.     Pupils: Pupils are equal, round, and reactive to light.  Neck:     Thyroid: No thyromegaly.     Vascular: No JVD.     Trachea: No tracheal deviation.  Cardiovascular:     Rate and Rhythm: Normal rate and regular rhythm.     Heart sounds: Normal heart sounds. No murmur heard.    No friction rub. No gallop.  Pulmonary:     Effort: Pulmonary effort is normal. No respiratory distress.     Breath sounds: Normal breath sounds. No stridor. No wheezing or rales.  Chest:     Chest wall: No tenderness.  Abdominal:     General: Bowel sounds are normal. There is no distension.     Palpations: Abdomen is soft. There is no mass.     Tenderness: There is no abdominal tenderness. There is no guarding or rebound.  Musculoskeletal:        General: No tenderness. Normal range of motion.     Cervical back: Normal range of motion and neck supple.  Lymphadenopathy:     Cervical: No cervical adenopathy.  Skin:    General: Skin  is warm.     Coloration: Skin is not pale.     Findings: No erythema or rash.  Neurological:     Mental Status: She is alert and oriented to person, place, and time.     Cranial Nerves: No cranial nerve deficit.     Motor: No abnormal muscle tone.     Coordination: Coordination normal.     Deep Tendon Reflexes: Reflexes are normal and symmetric.  Psychiatric:        Behavior: Behavior normal.        Thought Content: Thought content normal.        Judgment: Judgment normal.           Assessment & Plan:  History of breast cancer - Plan: Flu Vaccine QUAD High Dose(Fluad)  Routine general medical examination at a health care facility - Plan: Flu Vaccine QUAD High Dose(Fluad)  History of melanoma  Osteoporosis without current pathological fracture, unspecified osteoporosis type  Dyspnea, unspecified type - Plan: DG Chest 2 View  Flu vaccine need - Plan: Flu Vaccine QUAD High  Dose(Fluad) Physical exam today is relatively normal.  Lab work is excellent although she did have a slight drop in her hemoglobin from 14-12.  I like to repeat a CBC in 1 month to make sure this is not trending down further.  Given the dyspnea we will check a chest x-ray.  She has some mild diminished breath sounds suggesting possible obstructive lung disease.  She states she is been told she has asthma in the past.  Begin Fosamax weekly for osteoporosis 20 mg daily calcium 1000 units daily vitamin D.  Patient received a shot today.  The remainder of her immunizations are up-to-date.  Her colonoscopy was performed in 2016 and 2 showed 2 hyperplastic polyps.  She is not due for repeat colonoscopy until 2026.  Her mammogram was performed in February and is normal.

## 2022-05-27 ENCOUNTER — Ambulatory Visit
Admission: RE | Admit: 2022-05-27 | Discharge: 2022-05-27 | Disposition: A | Discharge status: Home / Self Care | Payer: Medicare PPO | Source: Ambulatory Visit | Attending: Family Medicine | Admitting: Family Medicine

## 2022-05-27 DIAGNOSIS — R0600 Dyspnea, unspecified: Secondary | ICD-10-CM

## 2022-05-27 DIAGNOSIS — R059 Cough, unspecified: Secondary | ICD-10-CM | POA: Diagnosis not present

## 2022-05-29 ENCOUNTER — Other Ambulatory Visit: Payer: Self-pay | Admitting: Family Medicine

## 2022-05-29 DIAGNOSIS — R0602 Shortness of breath: Secondary | ICD-10-CM

## 2022-05-29 DIAGNOSIS — J9819 Other pulmonary collapse: Secondary | ICD-10-CM

## 2022-06-11 ENCOUNTER — Ambulatory Visit
Admission: RE | Admit: 2022-06-11 | Discharge: 2022-06-11 | Disposition: A | Discharge status: Home / Self Care | Payer: Medicare PPO | Source: Ambulatory Visit | Attending: Family Medicine | Admitting: Family Medicine

## 2022-06-11 DIAGNOSIS — J9819 Other pulmonary collapse: Secondary | ICD-10-CM

## 2022-06-11 DIAGNOSIS — I700 Atherosclerosis of aorta: Secondary | ICD-10-CM | POA: Diagnosis not present

## 2022-06-11 DIAGNOSIS — J479 Bronchiectasis, uncomplicated: Secondary | ICD-10-CM | POA: Diagnosis not present

## 2022-06-11 DIAGNOSIS — J181 Lobar pneumonia, unspecified organism: Secondary | ICD-10-CM | POA: Diagnosis not present

## 2022-06-11 DIAGNOSIS — R0602 Shortness of breath: Secondary | ICD-10-CM

## 2022-06-11 DIAGNOSIS — R918 Other nonspecific abnormal finding of lung field: Secondary | ICD-10-CM | POA: Diagnosis not present

## 2022-06-11 MED ORDER — IOPAMIDOL (ISOVUE-300) INJECTION 61%
75.0000 mL | Freq: Once | INTRAVENOUS | Status: AC | PRN
  Administered 2022-06-11: 75 mL via INTRAVENOUS

## 2022-06-17 ENCOUNTER — Other Ambulatory Visit: Payer: Self-pay

## 2022-06-17 DIAGNOSIS — R0602 Shortness of breath: Secondary | ICD-10-CM

## 2022-06-30 ENCOUNTER — Other Ambulatory Visit: Payer: Medicare PPO

## 2022-06-30 DIAGNOSIS — R0602 Shortness of breath: Secondary | ICD-10-CM

## 2022-06-30 LAB — CBC WITH DIFFERENTIAL/PLATELET
Absolute Monocytes: 533 cells/uL (ref 200–950)
Basophils Absolute: 82 cells/uL (ref 0–200)
Basophils Relative: 1 %
Eosinophils Absolute: 139 cells/uL (ref 15–500)
Eosinophils Relative: 1.7 %
HCT: 38.9 % (ref 35.0–45.0)
Hemoglobin: 12.8 g/dL (ref 11.7–15.5)
Lymphs Abs: 1878 cells/uL (ref 850–3900)
MCH: 27.4 pg (ref 27.0–33.0)
MCHC: 32.9 g/dL (ref 32.0–36.0)
MCV: 83.1 fL (ref 80.0–100.0)
MPV: 9.8 fL (ref 7.5–12.5)
Monocytes Relative: 6.5 %
Neutro Abs: 5568 cells/uL (ref 1500–7800)
Neutrophils Relative %: 67.9 %
Platelets: 372 10*3/uL (ref 140–400)
RBC: 4.68 10*6/uL (ref 3.80–5.10)
RDW: 14.1 % (ref 11.0–15.0)
Total Lymphocyte: 22.9 %
WBC: 8.2 10*3/uL (ref 3.8–10.8)

## 2022-07-02 ENCOUNTER — Ambulatory Visit: Payer: Medicare PPO | Admitting: Internal Medicine

## 2022-07-02 ENCOUNTER — Encounter: Payer: Self-pay | Admitting: Internal Medicine

## 2022-07-02 VITALS — BP 104/60 | HR 68 | Temp 97.9°F | Ht 63.0 in | Wt 152.6 lb

## 2022-07-02 DIAGNOSIS — J9819 Other pulmonary collapse: Secondary | ICD-10-CM

## 2022-07-02 DIAGNOSIS — J4530 Mild persistent asthma, uncomplicated: Secondary | ICD-10-CM

## 2022-07-02 DIAGNOSIS — J479 Bronchiectasis, uncomplicated: Secondary | ICD-10-CM | POA: Diagnosis not present

## 2022-07-02 MED ORDER — LEVOFLOXACIN 500 MG PO TABS: 500.0000 mg | ORAL_TABLET | Freq: Every day | ORAL | 0 refills | Status: DC

## 2022-07-02 MED ORDER — BUDESONIDE-FORMOTEROL FUMARATE 80-4.5 MCG/ACT IN AERO
INHALATION_SPRAY | RESPIRATORY_TRACT | 12 refills | Status: DC
Start: 2022-07-02 — End: 2023-08-05

## 2022-07-02 NOTE — Assessment & Plan Note (Addendum)
Onset of symptoms  2007     -06/14/12   allergy screen neg rast/ Eos 0.1 IgE < 2    alpha one AT phenotype  MM level 152    - PFTs 04/26/2012  FEV1  1.68 (75%) ratio 53 and no better p B2,  DLC0 92%   - Spirometry 06/01/2013  FEV1  1.81(76%) ratio 70 and no dulera since March 2014   - HFA 75% 06/14/2012    - 07/02/2022 rec  symbicort 80 2 qam and pm dose optional (? keeps her up?)  She has very mild asthma which is only symptomatic with exertion but would benefit from low dose symbicort based on two studies from NEJM  378; 20 p 1865 (2018) and 380 : p2020-30 (2019) in pts with mild asthma it is reasonable to use low dose symbicort eg 80 2bid "prn" flare in this setting but I emphasized this was only shown with symbicort and takes advantage of the rapid onset of action but is not the same as "rescue therapy" but can be stopped once the acute symptoms have resolved and the need for rescue has been minimized (< 2 x weekly)

## 2022-07-02 NOTE — Assessment & Plan Note (Addendum)
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 Fairly widespread bilateral areas of tree-in-bud density throughout the lungs, progressed compared to prior CT from 2013 07/02/2022 rec levquin 500  mg q d x 7 days then return for baseline to baseline cxr comparisons  This is an extremely common benign condition in the elderly and does not warrant aggressive eval/ rx at this point unless there is a clinical correlation suggesting unaddressed pulmonary infection (purulent sputum, night sweats, unintended wt loss, doe) or evolution of  obvious changes on plain cxr (as opposed to serial CT, which is way over sensitive to make clinical decisions re intervention and treatment in the elderly, who tend to tolerate both dx and treatment poorly) .   For now rec levquin 500 x 7 days then return for apples to apples comparison/ new baseline to monitor going forward         Each maintenance medication was reviewed in detail including emphasizing most importantly the difference between maintenance and prns and under what circumstances the prns are to be triggered using an action plan format where appropriate.  Total time for H and P, chart review, counseling, reviewing hfa  device(s) and generating customized AVS unique to this office visit / same day charting >  45 min with pt not seen in > 3 years

## 2022-07-02 NOTE — Patient Instructions (Addendum)
Dulera 100 Take 2 puffs first thing in am and then another 2 puffs about 12 hours later.    Levaquin 500 mg daily x 7 days   Please schedule a follow up office visit in 2 weeks, sooner if needed with cxr

## 2022-07-02 NOTE — Progress Notes (Signed)
Subjective:    Patient ID: Catherine Long, female    DOB: 11/17/1950   MRN: [id number]   Brief patient profile:  71yowf MM with no significant smoking hx with tendency to cough esp doing yardwork since around 2007 assoc with sinus complaints then chronic cough developed in 2012 referred 02/26/2012 to pulmonary clinic 02/26/12 for ? MAI    History of Present Illness   02/26/2012 1st pulmonary ov cc now  daily cough x one year intermittently green mod thick but no more than a few tsp no assoc sob or sinus complaints or hb despite fish oil x 10 years. Cough is worse first thing in am but doesn't wake her.  rec  If mucus turns really green and nasty best choice is levaquin 750 mg one daily x 5 days GERD diet.   04/26/2012 f/u ov/Onur Mori cc cough gone to her satisfaction min in am's min discolored, no sob. Did not take levaquin, not using mucinex. rec Start dulera 100 Take 2 puffs first thing in am and then another 2 puffs about 12 hours later.  Work on inhaler technique:   Pneumovax and influenza given         06/01/2013 f/u ov/Novak Stgermaine re: ? Chronic asthma/ rml syndrome Chief Complaint  Patient presents with   Follow-up    6 mth f/u - Denies cough , sob, wheeze or chest discomfort - Wants flu shot  no meds at all x 6 months, not limited from desired activities but no longer doing yard work Rec [employer] 100  is used 2 puffs every 12 hours only  if having any symptoms of cough/ wheeze/ short of breath In the future, when your dulera runs out, all I recommend is proaire up to 2 puffs four times a day as needed  - if needing either the dulera or the proaire more than twice a week we need to see you    07/02/2022  re-establish f/u ov/Jonathen Rathman re: MAI/ mild intermittent asthma  maint on no resp rx  Chief Complaint  Patient presents with   Consult    C/o sob with exertion, cough dry occass., CT chest scan this mth.   Dyspnea:  only with yardwork / walks neighborhood  though last time 2 months prior  to OV   Cough: dry/ no am flares  Sleeping: does fine flat  SABA use: does not have one anymore  02: none  Covid status:   vax max  and infected 2022    No obvious day to day or daytime variability or assoc excess/ purulent sputum or mucus plugs or hemoptysis or cp or chest tightness, subjective wheeze or overt sinus or hb symptoms.   Sleeping  without nocturnal  or early am exacerbation  of respiratory  c/o's or need for noct saba. Also denies any obvious fluctuation of symptoms with weather or environmental changes or other aggravating or alleviating factors except as outlined above   No unusual exposure hx or h/o childhood pna/ asthma or knowledge of premature birth.  Current Allergies, Complete Past Medical History, Past Surgical History, Family History, and Social History were reviewed in [employer] record.  ROS  The following are not active complaints unless bolded Hoarseness, sore throat, dysphagia, dental problems, itching, sneezing,  nasal congestion or discharge of excess mucus or purulent secretions, ear ache,   fever, chills, sweats, unintended wt loss or wt gain, classically pleuritic or exertional cp,  orthopnea pnd or arm/hand swelling  or leg swelling, presyncope, palpitations,  abdominal pain, anorexia, nausea, vomiting, diarrhea  or change in bowel habits or change in bladder habits, change in stools or change in urine, dysuria, hematuria,  rash, arthralgias, visual complaints, headache, numbness, weakness or ataxia or problems with walking or coordination,  change in mood or  memory.        Current Meds  Medication Sig   anastrozole (ARIMIDEX) 1 MG tablet TAKE 1 TABLET EVERY DAY   ascorbic acid (VITAMIN C) 1000 MG tablet Take 1,000 mg by mouth daily.   B Complex-C (SUPER B COMPLEX) TABS Take 1 tablet by mouth daily.   Calcium Carbonate-Vitamin D3 (CALCIUM 600/VITAMIN D) 600-400 MG-UNIT TABS Take 2 tablets by mouth daily.   Multiple Vitamins-Minerals  (MULTI COMPLETE/IRON PO) Take 1 tablet by mouth daily.   Turmeric 500 MG CAPS Take 500 mg by mouth daily.                Objective:   Physical Exam    Wt Readings from Last 3 Encounters:  07/02/22 152 lb 9.6 oz (69.2 kg)  05/25/22 155 lb 12.8 oz (70.7 kg)  12/19/21 150 lb 9.6 oz (68.3 kg)  Wt 144 02/26/2012   Vital signs reviewed  07/02/2022  - Note at rest 02 sats  97% on RA   General appearance:    amb wf nad   HEENT : Oropharynx  clear      Nasal turbinates nl    NECK :  without  apparent JVD/ palpable Nodes/TM    LUNGS: no acc muscle use,  Nl contour chest  with trace wheeze at end exp   CV:  RRR  no s3 or murmur or increase in P2, and no edema   ABD:  soft and nontender   MS:  Nl gait/ ext warm without deformities Or obvious joint restrictions  calf tenderness, cyanosis or clubbing    SKIN: warm and dry without lesions    NEURO:  alert, approp, nl sensorium with  no motor or cerebellar deficits apparent.           I personally reviewed images and agree with radiology impression as follows:   Chest CT w contrast 06/11/22 1. Partial consolidations at the lingula and right middle lobe with bronchiectasis and bronchial wall thickening. Fairly widespread bilateral areas of tree-in-bud density throughout the lungs, progressed compared to prior CT from 2013. Findings are consistent with chronic atypical infection such as MAI. Numerous small pulmonary nodules generally associated with the areas of tree-in-bud density and likely related to that infectious process, suggest 3 to six-month CT follow-up. 2. Interval enlargement of a fat density lesion in the right posterior chest wall, now measuring up to 7.2 cm, suspected to  represent large lipoma, but given growth compared to prior, could consider correlation with MRI to confirm. 3. 4.5 cm hypodense lesion in the right hepatic lobe, probably present on prior exam from 2013, with patchy peripheral  enhancement suggesting a hemangioma.     Assessment & Plan:

## 2022-07-10 DIAGNOSIS — H04123 Dry eye syndrome of bilateral lacrimal glands: Secondary | ICD-10-CM | POA: Diagnosis not present

## 2022-07-10 DIAGNOSIS — H2513 Age-related nuclear cataract, bilateral: Secondary | ICD-10-CM | POA: Diagnosis not present

## 2022-07-10 DIAGNOSIS — H43813 Vitreous degeneration, bilateral: Secondary | ICD-10-CM | POA: Diagnosis not present

## 2022-07-12 NOTE — Progress Notes (Unsigned)
Subjective:    Patient ID: Catherine Long, female    DOB: Dec 29, 1950   MRN: [id number]   Brief patient profile:  71yowf MM with no significant smoking hx with tendency to cough esp doing yardwork since around 2007 assoc with sinus complaints then chronic cough developed in 2012 referred 02/26/2012 to pulmonary clinic 02/26/12 for ? MAI    History of Present Illness   02/26/2012 1st pulmonary ov cc now  daily cough x one year intermittently green mod thick but no more than a few tsp no assoc sob or sinus complaints or hb despite fish oil x 10 years. Cough is worse first thing in am but doesn't wake her.  rec  If mucus turns really green and nasty best choice is levaquin 750 mg one daily x 5 days GERD diet.   04/26/2012 f/u ov/Sandee Bernath cc cough gone to her satisfaction min in am's min discolored, no sob. Did not take levaquin, not using mucinex. rec Start dulera 100 Take 2 puffs first thing in am and then another 2 puffs about 12 hours later.  Work on inhaler technique:   Pneumovax and influenza given         06/01/2013 f/u ov/Marykay Mccleod re: ? Chronic asthma/ rml syndrome Chief Complaint  Patient presents with   Follow-up    6 mth f/u - Denies cough , sob, wheeze or chest discomfort - Wants flu shot  no meds at all x 6 months, not limited from desired activities but no longer doing yard work Rec [employer] 100  is used 2 puffs every 12 hours only  if having any symptoms of cough/ wheeze/ short of breath In the future, when your dulera runs out, all I recommend is proaire up to 2 puffs four times a day as needed  - if needing either the dulera or the proaire more than twice a week we need to see you    07/02/2022  re-establish f/u ov/Stockton Nunley re: MAI/ mild intermittent asthma  maint on no resp rx  Chief Complaint  Patient presents with   Consult    C/o sob with exertion, cough dry occass., CT chest scan this mth.   Dyspnea:  only with yardwork / walks neighborhood  though last time 2 months prior  to OV   Cough: dry/ no am flares  Sleeping: does fine flat  SABA use: does not have one anymore  02: none  Covid status:   vax max  and infected 2022  Rec Dulera 100 Take 2 puffs first thing in am and then another 2 puffs about 12 hours later.   Levaquin 500 mg daily x 7 days  Please schedule a follow up office visit in 2 weeks, sooner if needed with cxr    07/13/2022  f/u ov/Tracey Hermance re: ***   maint on ***  No chief complaint on file.   Dyspnea:  *** Cough: *** Sleeping: *** SABA use: *** 02: *** Covid status:   *** Lung cancer screening :  ***    No obvious day to day or daytime variability or assoc excess/ purulent sputum or mucus plugs or hemoptysis or cp or chest tightness, subjective wheeze or overt sinus or hb symptoms.   *** without nocturnal  or early am exacerbation  of respiratory  c/o's or need for noct saba. Also denies any obvious fluctuation of symptoms with weather or environmental changes or other aggravating or alleviating factors except as outlined above   No unusual exposure hx or h/o childhood pna/  asthma or knowledge of premature birth.  Current Allergies, Complete Past Medical History, Past Surgical History, Family History, and Social History were reviewed in [employer] record.  ROS  The following are not active complaints unless bolded Hoarseness, sore throat, dysphagia, dental problems, itching, sneezing,  nasal congestion or discharge of excess mucus or purulent secretions, ear ache,   fever, chills, sweats, unintended wt loss or wt gain, classically pleuritic or exertional cp,  orthopnea pnd or arm/hand swelling  or leg swelling, presyncope, palpitations, abdominal pain, anorexia, nausea, vomiting, diarrhea  or change in bowel habits or change in bladder habits, change in stools or change in urine, dysuria, hematuria,  rash, arthralgias, visual complaints, headache, numbness, weakness or ataxia or problems with walking or coordination,   change in mood or  memory.        No outpatient medications have been marked as taking for the 07/13/22 encounter (Appointment) with Tanda Rockers, MD.               Objective:   Physical Exam   wts  07/13/2022      ***  07/02/22 152 lb 9.6 oz (69.2 kg)  05/25/22 155 lb 12.8 oz (70.7 kg)  12/19/21 150 lb 9.6 oz (68.3 kg)  Wt 144 02/26/2012    Vital signs reviewed  07/13/2022  - Note at rest 02 sats  ***% on ***   General appearance:    ***         I personally reviewed images and agree with radiology impression as follows:   Chest CT w contrast 06/11/22 1. Partial consolidations at the lingula and right middle lobe with bronchiectasis and bronchial wall thickening. Fairly widespread bilateral areas of tree-in-bud density throughout the lungs, progressed compared to prior CT from 2013. Findings are consistent with chronic atypical infection such as MAI. Numerous small pulmonary nodules generally associated with the areas of tree-in-bud density and likely related to that infectious process, suggest 3 to six-month CT follow-up. 2. Interval enlargement of a fat density lesion in the right posterior chest wall, now measuring up to 7.2 cm, suspected to  represent large lipoma, but given growth compared to prior, could consider correlation with MRI to confirm. 3. 4.5 cm hypodense lesion in the right hepatic lobe, probably present on prior exam from 2013, with patchy peripheral enhancement suggesting a hemangioma.     Assessment & Plan:

## 2022-07-13 ENCOUNTER — Ambulatory Visit (INDEPENDENT_AMBULATORY_CARE_PROVIDER_SITE_OTHER): Payer: Medicare PPO

## 2022-07-13 ENCOUNTER — Ambulatory Visit: Payer: Medicare PPO | Admitting: Internal Medicine

## 2022-07-13 ENCOUNTER — Encounter: Payer: Self-pay | Admitting: Internal Medicine

## 2022-07-13 VITALS — BP 108/64 | HR 78 | Temp 97.8°F | Ht 63.0 in | Wt 155.0 lb

## 2022-07-13 DIAGNOSIS — J4530 Mild persistent asthma, uncomplicated: Secondary | ICD-10-CM | POA: Diagnosis not present

## 2022-07-13 DIAGNOSIS — J479 Bronchiectasis, uncomplicated: Secondary | ICD-10-CM

## 2022-07-13 NOTE — Patient Instructions (Signed)
Symbicort 80 up to 2 puffs every 12 hours for a week then taper off if doing great  Work on perfecting inhaler technique:  relax and gently blow all the way out then take a nice smooth full deep breath back in, triggering the inhaler at same time you start breathing in.  Hold breath in for at least  5 seconds if you can. Blow out blow out  thru nose. Rinse and gargle with water when done.  If mouth or throat bother you at all,  try brushing teeth/gums/tongue with arm and hammer toothpaste/ make a slurry and gargle and spit out.  Please remember to go to the  x-ray department  for your tests - we will call you with the results when they are available    Please schedule a follow up visit in 6  months but call sooner if needed

## 2022-07-14 ENCOUNTER — Encounter: Payer: Self-pay | Admitting: Internal Medicine

## 2022-07-14 NOTE — Assessment & Plan Note (Signed)
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 Fairly widespread bilateral areas of tree-in-bud density throughout the lungs, progressed compared to prior CT from 2013 07/02/2022 rec levquin 500  mg q d x 7 days then return for baseline to baseline cxr comparisons - cxr 07/13/2022 only slt worse vs baseline   No clinical worsening so rec continue conservative f/u - if sputum turns purulent rec am sputum for routine culture and afb smear/culture  Discussed in detail all the  indications, usual  risks and alternatives  relative to the benefits with patient who agrees to proceed with Rx as outlined.      F/u  q 6 m sooner prn

## 2022-07-14 NOTE — Assessment & Plan Note (Signed)
Onset of symptoms  2007     -06/14/12   allergy screen neg rast/ Eos 0.1 IgE < 2    alpha one AT phenotype  MM level 152    - PFTs 04/26/2012  FEV1  1.68 (75%) ratio 53 and no better p B2,  DLC0 92%   - Spirometry 06/01/2013  FEV1  1.81(76%) ratio 70 and no dulera since March 2014   - HFA 75% 06/14/2012    - 07/02/2022 rec  symbicort 80 2 qam and pm dose optional (? keeps her up?)  Based on two studies from NEJM  378; 20 p 1865 (2018) and 380 : p2020-30 (2019) in pts with mild asthma it is reasonable to use low dose symbicort eg 80 2bid "prn" flare in this setting but I emphasized this was only shown with symbicort and takes advantage of the rapid onset of action but is not the same as "rescue therapy" but can be stopped once the acute symptoms have resolved and the need for rescue has been minimized (< 2 x weekly)    F/u q 6 m, sooner prn         Each maintenance medication was reviewed in detail including emphasizing most importantly the difference between maintenance and prns and under what circumstances the prns are to be triggered using an action plan format where appropriate.  Total time for H and P, chart review, counseling, reviewing hfa device(s) and generating customized AVS unique to this office visit / same day charting = 21 min

## 2022-07-15 ENCOUNTER — Telehealth: Payer: Self-pay

## 2022-07-15 DIAGNOSIS — L814 Other melanin hyperpigmentation: Secondary | ICD-10-CM | POA: Diagnosis not present

## 2022-07-15 DIAGNOSIS — D1801 Hemangioma of skin and subcutaneous tissue: Secondary | ICD-10-CM | POA: Diagnosis not present

## 2022-07-15 DIAGNOSIS — L821 Other seborrheic keratosis: Secondary | ICD-10-CM | POA: Diagnosis not present

## 2022-07-15 DIAGNOSIS — L570 Actinic keratosis: Secondary | ICD-10-CM | POA: Diagnosis not present

## 2022-07-15 DIAGNOSIS — L820 Inflamed seborrheic keratosis: Secondary | ICD-10-CM | POA: Diagnosis not present

## 2022-07-15 DIAGNOSIS — Z85828 Personal history of other malignant neoplasm of skin: Secondary | ICD-10-CM | POA: Diagnosis not present

## 2022-07-15 NOTE — Telephone Encounter (Signed)
-----   Message from Tanda Rockers, MD sent at 07/14/2022  5:44 AM EST ----- Call pt:  Reviewed cxr and overall minimal clinically significant changes  but she's doing so well so for now would continue with recs but if mucus turns nasty would get am sputum for gm stain and culture and afb stain and culture so we know better if possible what organisms are contributing to her problem - we then might consider ID referral but not needed for now.

## 2022-07-15 NOTE — Telephone Encounter (Signed)
Called Pt to give her the results from her chest xray. Pt stated understanding and nothing further needed.

## 2022-07-15 NOTE — Progress Notes (Signed)
Spoke with pt and notified of results per Dr. Wert. Pt verbalized understanding and denied any questions. 

## 2022-07-30 IMAGING — MG MM BREAST LOCALIZATION CLIP
4 series · 4 of 12 positions shown · non-contrast
Comparison: Previous exam(s).

CLINICAL DATA: Evaluate biopsy marker.

EXAM:
DIAGNOSTIC RIGHT MAMMOGRAM POST ULTRASOUND BIOPSY

[R ML synth-2D]
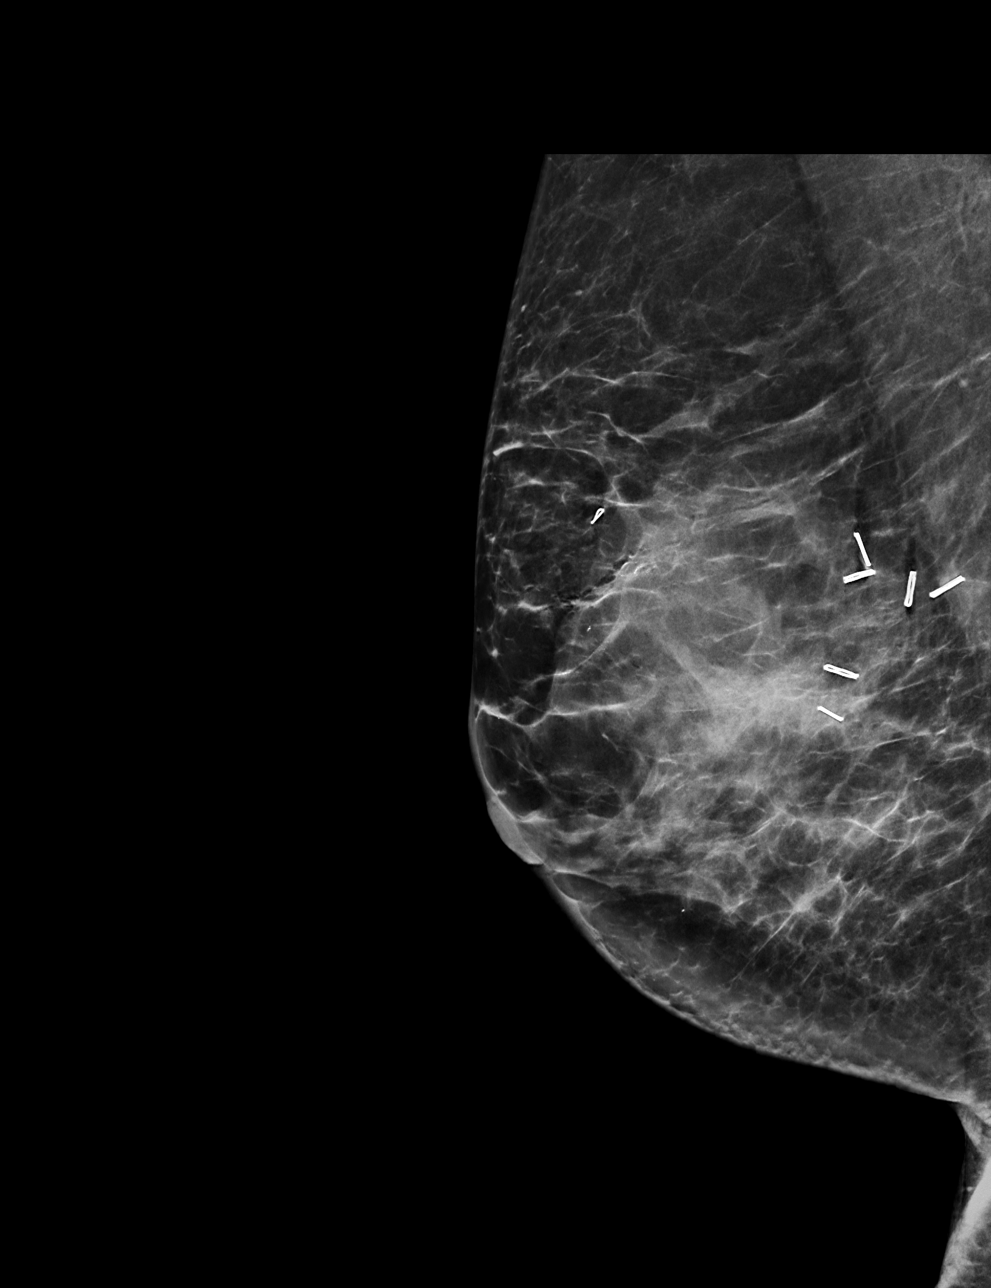

[R CC synth-2D]
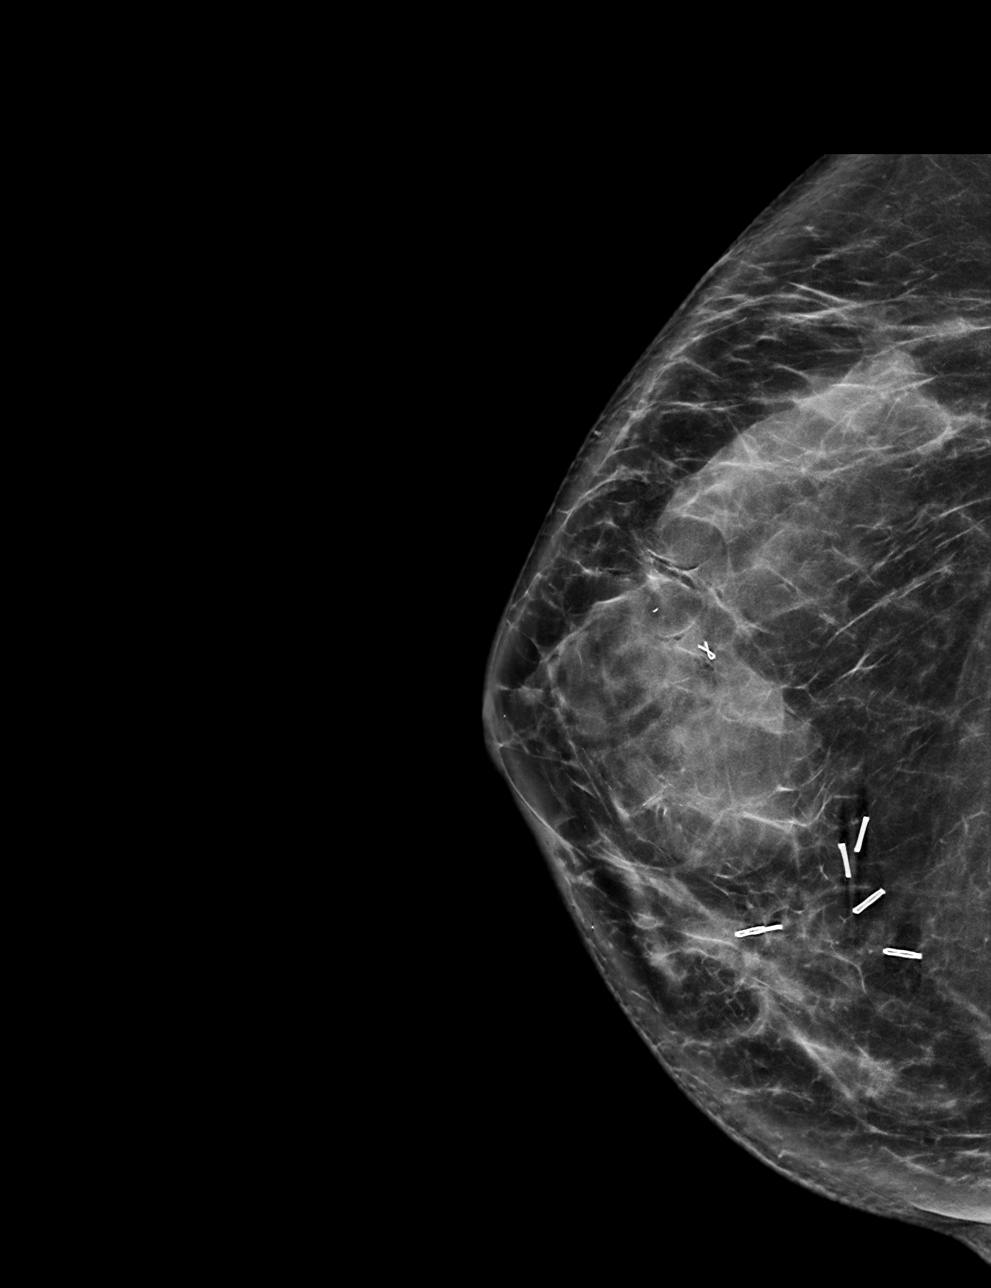

[R CC tomo · tomo slice 39/78.0]
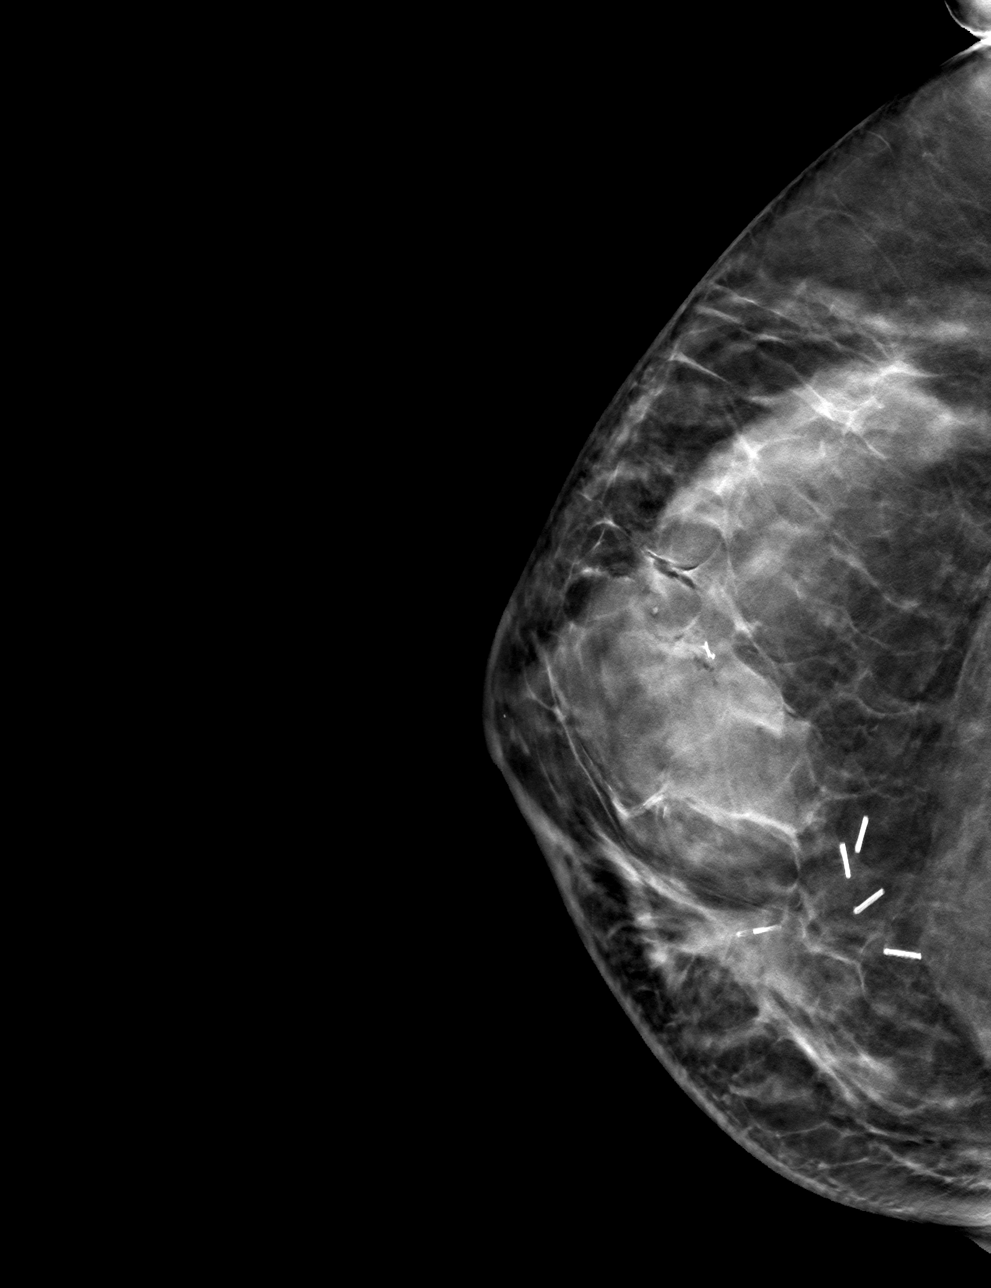

[R ML tomo · tomo slice 39/77.0]
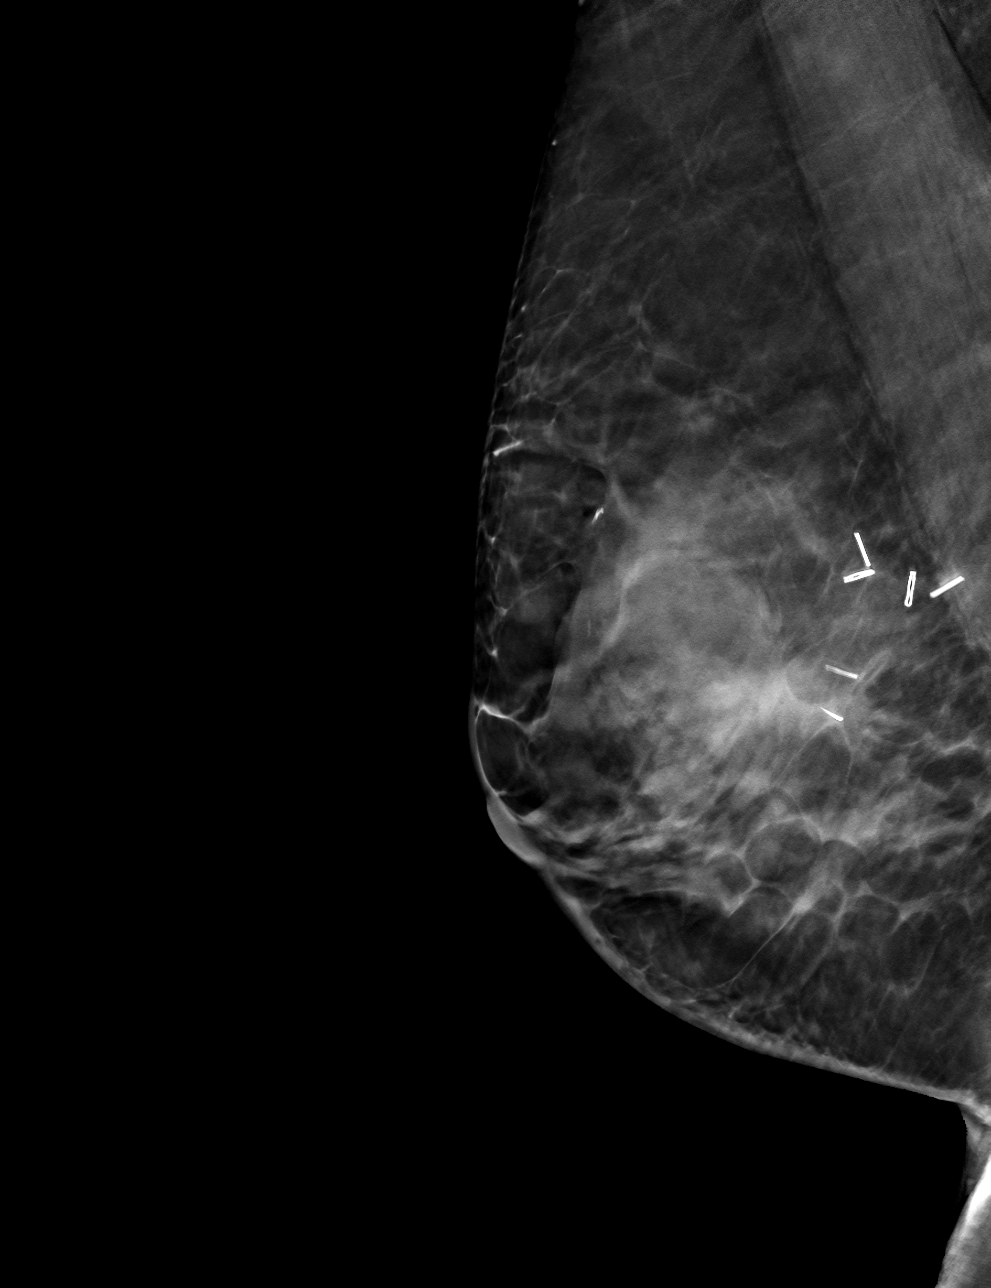

[4 of 12 positions shown; findings below may reference images not displayed]

FINDINGS: Mammographic images were obtained following ultrasound guided biopsy
of a right breast mass. The biopsy marking clip is in expected
position at the site of biopsy.
IMPRESSION: Appropriate positioning of the ribbon shaped biopsy marking clip at
the site of biopsy in the region of the biopsied mass.

Final Assessment: Post Procedure Mammograms for Marker Placement

## 2022-07-31 ENCOUNTER — Other Ambulatory Visit: Payer: Self-pay | Admitting: Hematology and Oncology

## 2022-07-31 DIAGNOSIS — Z853 Personal history of malignant neoplasm of breast: Secondary | ICD-10-CM

## 2022-09-16 ENCOUNTER — Ambulatory Visit
Admission: RE | Admit: 2022-09-16 | Discharge: 2022-09-16 | Disposition: A | Discharge status: Home / Self Care | Payer: Medicare PPO | Source: Ambulatory Visit | Attending: Hematology and Oncology | Admitting: Hematology and Oncology

## 2022-09-16 DIAGNOSIS — Z853 Personal history of malignant neoplasm of breast: Secondary | ICD-10-CM

## 2022-09-28 NOTE — Progress Notes (Signed)
Patient Care Team: [name], MD as PCP - General (Family Medicine) [name] as Physician Assistant (Unknown Physician Specialty) [name], MD as Consulting Physician (Hematology and Oncology) [name], MD as Attending Physician (Radiation Oncology) [name], MD as Consulting Physician (General Surgery)  DIAGNOSIS: No diagnosis found.  SUMMARY OF ONCOLOGIC HISTORY: Oncology History  Malignant neoplasm of upper-inner quadrant of right breast in female, estrogen receptor positive ([place])  09/07/2019 Initial Diagnosis   Screening mammogram detected a possible right breast mass. Diagnostic mammogram showed two adjacent and nearly contiguous masses at the 2 o'clock position, 1.7cm, no right axillary adenopathy. Biopsy showed invasive mammary carcinoma, grade 2, HER-2 equivocal by IHC, negative by FISH, ER+ 95%, PR+ 90%, Ki67 2%.   09/19/2019 Genetic Testing   Negative genetic testing:  No pathogenic variants detected on the [facility] Breast Cancer STAT panel, Common Hereditary Cancers panel, or Melanoma panel. A variant of uncertain significance (VUS) was detected in the POLE gene called c.1357C>G. The report date is 09/19/2019.  UPDATE:  The POLE c.1357C>G VUS was reclassified to "likely benign" on 02/22/2020.  The STAT Breast cancer panel offered by [facility] includes sequencing and rearrangement analysis for the following 9 genes:  ATM, BRCA1, BRCA2, CDH1, CHEK2, PALB2, PTEN, STK11 and TP53. The Common Hereditary Cancers Panel offered by [facility] includes sequencing and/or deletion duplication testing of the following 48 genes: APC, ATM, AXIN2, BARD1, BMPR1A, BRCA1, BRCA2, BRIP1, CDH1, CDK4, CDKN2A (p14ARF), CDKN2A (p16INK4a), CHEK2, CTNNA1, DICER1, EPCAM (Deletion/duplication testing only), GREM1 (promoter region deletion/duplication testing only), KIT, MEN1, MLH1, MSH2, MSH3, MSH6, MUTYH, NBN, NF1, NHTL1, PALB2, PDGFRA, PMS2, POLD1, POLE, PTEN, RAD50, RAD51C, RAD51D,  RNF43, SDHB, SDHC, SDHD, SMAD4, SMARCA4. STK11, TP53, TSC1, TSC2, and VHL.  The following genes were evaluated for sequence changes only: SDHA and HOXB13 c.251G>A variant only. The Melanoma panel offered by [facility] includes sequencing and/or deletion duplication testing of the following 9 genes: BAP1, BRCA2, BRIP1, CDK4, CDKN2A (p14ARF), CDKN2A (p16INK4a), POT1, PTEN, RB1, and TP53.  The following gene was evaluated for sequence changes only: MITF (c.952G>A, p.Glu318Lys variant only).    09/27/2019 Surgery   Right lumpectomy Ninfa Linden) [phone]): Grade 2 IDC 1.5 cm with intermediate grade DCIS, margins negative, 4 lymph nodes negative, ER 95%, PR 90%, HER-2 negative, Ki-67 2%   10/04/2019 Cancer Staging   Staging form: Breast, AJCC 8th Edition - Pathologic stage from 10/04/2019: Stage IA (pT1c, pN0, cM0, G2, ER+, PR+, HER2-)    10/11/2019 Oncotype testing   The Oncotype DX score was 9 predicting a risk of outside the breast recurrence over the next 9 years of 3% if the patient's only systemic therapy is tamoxifen for 5 years.    11/07/2019 - 12/04/2019 Radiation Therapy   The patient initially received a dose of 40.05 Gy in 15 fractions to the breast using whole-breast tangent fields. This was delivered using a 3-D conformal technique. The pt received a boost delivering an additional 10 Gy in 5 fractions using a electron boost with 22mV electrons. The total dose was 50.05 Gy.   12/2019 - 12/2024 Anti-estrogen oral therapy   Anastrozole     CHIEF COMPLIANT: Follow-up of right breast cancer on anastrozole    INTERVAL HISTORY: Catherine CROSSis a 72y.o. with above-mentioned history of right breast cancer who underwent a right lumpectomy, radiation, and is currently on antiestrogen therapy with with anastrozole. Mammogram on 09/12/2021 showed no evidence of malignancy bilaterally. She presents to the clinic today for follow-up.  ALLERGIES:  is allergic to latex.  MEDICATIONS:  Current  Outpatient Medications  Medication Sig Dispense Refill   alendronate (FOSAMAX) 70 MG tablet Take 1 tablet (70 mg total) by mouth every 7 (seven) days. Take with a full glass of water on an empty stomach. 12 tablet 3   anastrozole (ARIMIDEX) 1 MG tablet TAKE 1 TABLET EVERY DAY 90 tablet 3   ascorbic acid (VITAMIN C) 1000 MG tablet Take 1,000 mg by mouth daily.     B Complex-C (SUPER B COMPLEX) TABS Take 1 tablet by mouth daily.     budesonide-formoterol (SYMBICORT) 80-4.5 MCG/ACT inhaler Take 2 puffs first thing in am and then another 2 puffs about 12 hours later. 1 each 12   Calcium Carbonate-Vitamin D3 (CALCIUM 600/VITAMIN D) 600-400 MG-UNIT TABS Take 2 tablets by mouth daily.     levofloxacin (LEVAQUIN) 500 MG tablet Take 1 tablet (500 mg total) by mouth daily. 7 tablet 0   Multiple Vitamins-Minerals (MULTI COMPLETE/IRON PO) Take 1 tablet by mouth daily.     Turmeric 500 MG CAPS Take 500 mg by mouth daily.      No current facility-administered medications for this visit.    PHYSICAL EXAMINATION: ECOG PERFORMANCE STATUS: {CHL ONC ECOG PS:[phone]}  There were no vitals filed for this visit. There were no vitals filed for this visit.  BREAST:*** No palpable masses or nodules in either right or left breasts. No palpable axillary supraclavicular or infraclavicular adenopathy no breast tenderness or nipple discharge. (exam performed in the presence of a chaperone)  LABORATORY DATA:  I have reviewed the data as listed    Latest Ref Rng & Units 05/18/2022    8:24 AM 05/12/2021    8:03 AM 05/06/2020    8:37 AM  CMP  Glucose 65 - 99 mg/dL 90  93  88   BUN 7 - 25 mg/dL '9  15  11   '$ Creatinine 0.60 - 1.00 mg/dL 0.71  0.83  0.75   Sodium 135 - 146 mmol/L 138  139  140   Potassium 3.5 - 5.3 mmol/L 4.5  4.6  4.1   Chloride 98 - 110 mmol/L 102  102  103   CO2 20 - 32 mmol/L '28  30  28   '$ Calcium 8.6 - 10.4 mg/dL 9.5  9.4  9.6   Total Protein 6.1 - 8.1 g/dL 6.9  6.7  6.7   Total Bilirubin  0.2 - 1.2 mg/dL 0.3  0.5  0.6   AST 10 - 35 U/L '19  17  16   '$ ALT 6 - 29 U/L '12  15  12     '$ Lab Results  Component Value Date   WBC 8.2 06/30/2022   HGB 12.8 06/30/2022   HCT 38.9 06/30/2022   MCV 83.1 06/30/2022   PLT 372 06/30/2022   NEUTROABS 5,568 06/30/2022    ASSESSMENT & PLAN:  No problem-specific Assessment & Plan notes found for this encounter.    No orders of the defined types were placed in this encounter.  The patient has a good understanding of the overall plan. she agrees with it. she will call with any problems that may develop before the next visit here. Total time spent: 30 mins including face to face time and time spent for planning, charting and co-ordination of care   Suzzette Righter, [place] 09/28/22    I Gardiner Coins am acting as a Education administrator for [employer]  ***

## 2022-09-30 ENCOUNTER — Inpatient Hospital Stay: Payer: Medicare PPO | Attending: Hematology and Oncology | Admitting: Hematology and Oncology

## 2022-09-30 VITALS — BP 144/61 | HR 75 | Temp 97.9°F | Resp 18 | Ht 63.0 in | Wt 152.8 lb

## 2022-09-30 DIAGNOSIS — Z170 Estrogen receptor positive status [ER+]: Secondary | ICD-10-CM | POA: Insufficient documentation

## 2022-09-30 DIAGNOSIS — C50211 Malignant neoplasm of upper-inner quadrant of right female breast: Secondary | ICD-10-CM | POA: Diagnosis not present

## 2022-09-30 DIAGNOSIS — Z79811 Long term (current) use of aromatase inhibitors: Secondary | ICD-10-CM | POA: Diagnosis not present

## 2022-09-30 MED ORDER — ANASTROZOLE 1 MG PO TABS: 1.0000 mg | ORAL_TABLET | Freq: Every day | ORAL | 3 refills | Status: DC

## 2022-09-30 NOTE — Assessment & Plan Note (Signed)
09/07/2019:Screening mammogram detected a possible right breast mass. Diagnostic mammogram showed two adjacent and nearly contiguous masses at the 2 o'clock position, 1.7cm, no right axillary adenopathy. Biopsy showed invasive mammary carcinoma, grade 2, HER-2 equivocal by IHC, negative by FISH, ER+ 95%, PR+ 90%, Ki67 2%. T1CN0 stage Ia clinical stage   Right lumpectomy 09/27/2019: Grade 2 IDC 1.5 cm with intermediate grade DCIS, margins negative, 0/4 lymph nodes negative, ER 95%, PR 90%, HER-2 negative, Ki-67 2% Oncotype score: 9, distant recurrence at 9 years: 3% Upbeat clinical trial: No adverse effects to participating in the study   Adjuvant radiation: 11/08/2019-12/04/2019    Recommendation: Adjuvant antiestrogen therapy with anastrozole 1 mg daily x5 to 7 years started 12/21/19   Anastrozole Toxicities: Denies any major adverse effects to anastrozole therapy.  Denies any hot flashes or arthralgias or myalgias. Bone density January 2021: T score -1.9: Osteopenia   Breast Cancer Surveillance: 1. Breast Exam: 09/30/22: Benign 2. Mammogram: 09/16/2022: Benign breast density category C. Biopsy 09/10/20: Fibroadenoma   RTC in 1 year for follow up

## 2023-01-05 ENCOUNTER — Telehealth: Payer: Self-pay

## 2023-01-05 DIAGNOSIS — C50211 Malignant neoplasm of upper-inner quadrant of right female breast: Secondary | ICD-10-CM

## 2023-01-05 NOTE — Telephone Encounter (Signed)
01/05/23 - Upbeat Study 3 year follow-up phone call. Called and spoke to patient over the phone for her 3 year follow-up visit for the Upbeat study.  She stated she had not been in the hospital nor had any cardiovascular events in the past year.  She wants the questionnaire sent to her e-mail and I confirmed her e-mail address was correct.  I thanked the patient for her continued support in this clinical trial and that she would be called next year for her next follow-up. Yesena Reaves, CRC 01/05/2023 10:30 AM

## 2023-01-11 ENCOUNTER — Ambulatory Visit: Payer: Medicare PPO | Admitting: Internal Medicine

## 2023-01-24 NOTE — Progress Notes (Unsigned)
Subjective:    Patient ID: Catherine Long, female    DOB: [birth year]   MRN: [id number]   Brief patient profile:  22 yowf MM with no significant smoking hx with tendency to cough esp doing yardwork since around 2007 assoc with sinus complaints then chronic cough developed in 2012 referred 02/26/2012 to pulmonary clinic 02/26/12 for ? MAI    History of Present Illness   02/26/2012 1st pulmonary ov cc now  daily cough x one year intermittently green mod thick but no more than a few tsp no assoc sob or sinus complaints or hb despite fish oil x 10 years. Cough is worse first thing in am but doesn't wake her.  rec  If mucus turns really green and nasty best choice is levaquin 750 mg one daily x 5 days GERD diet.   04/26/2012 f/u ov/Catherine Long cc cough gone to her satisfaction min in am's min discolored, no sob. Did not take levaquin, not using mucinex. rec Start dulera 100 Take 2 puffs first thing in am and then another 2 puffs about 12 hours later.  Work on inhaler technique:   Pneumovax and influenza given      06/01/2013 f/u ov/[name] re: ? Chronic asthma/ rml syndrome Chief Complaint  Patient presents with   Follow-up    6 mth f/u - Denies cough , sob, wheeze or chest discomfort - Wants flu shot  no meds at all x 6 months, not limited from desired activities but no longer doing yard work Rec [employer] 100  is used 2 puffs every 12 hours only  if having any symptoms of cough/ wheeze/ short of breath In the future, when your dulera runs out, all I recommend is proaire up to 2 puffs four times a day as needed  - if needing either the dulera or the proaire more than twice a week we need to see you    07/02/2022  re-establish f/u ov/Catherine Long re: MAI/ mild intermittent asthma  maint on no resp rx  Chief Complaint  Patient presents with   Consult    C/o sob with exertion, cough dry occass., CT chest scan this mth.   Dyspnea:  only with yardwork / walks neighborhood  though last time 2 months prior to OV    Cough: dry/ no am flares  Sleeping: does fine flat  SABA use: does not have one anymore  02: none  Covid status:   vax max  and infected 2022  Rec Dulera 100 Take 2 puffs first thing in am and then another 2 puffs about 12 hours later.  Levaquin 500 mg daily x 7 days  Please schedule a follow up office visit in 2 weeks, sooner if needed with cxr    07/13/2022  f/u ov/Catherine Long re: RML/Lingula syndrome   maint on symbicort 80 2 puffs in am   Chief Complaint  Patient presents with   Follow-up    Asthma   Dyspnea:  back to baseline  Cough: none  Sleeping: fine  SABA use: none  02: none  Rec Symbicort 80 up to 2 puffs every 12 hours for a week then taper off if doing great Work on perfecting inhaler technique:       Please schedule a follow up visit in 6  months but call sooner if needed     01/25/2023  6 m f/u ov/[name] re: *** maint on ***  - needs repeat cxr for ? Worse bronchiectasis on priors No chief complaint on file.  Dyspnea:  *** Cough: *** Sleeping: *** SABA use: *** 02: *** Covid status: *** Lung cancer screening: ***   No obvious day to day or daytime variability or assoc excess/ purulent sputum or mucus plugs or hemoptysis or cp or chest tightness, subjective wheeze or overt sinus or hb symptoms.   *** without nocturnal  or early am exacerbation  of respiratory  c/o's or need for noct saba. Also denies any obvious fluctuation of symptoms with weather or environmental changes or other aggravating or alleviating factors except as outlined above   No unusual exposure hx or h/o childhood pna/ asthma or knowledge of premature birth.  Current Allergies, Complete Past Medical History, Past Surgical History, Family History, and Social History were reviewed in [employer] record.  ROS  The following are not active complaints unless bolded Hoarseness, sore throat, dysphagia, dental problems, itching, sneezing,  nasal congestion or  discharge of excess mucus or purulent secretions, ear ache,   fever, chills, sweats, unintended wt loss or wt gain, classically pleuritic or exertional cp,  orthopnea pnd or arm/hand swelling  or leg swelling, presyncope, palpitations, abdominal pain, anorexia, nausea, vomiting, diarrhea  or change in bowel habits or change in bladder habits, change in stools or change in urine, dysuria, hematuria,  rash, arthralgias, visual complaints, headache, numbness, weakness or ataxia or problems with walking or coordination,  change in mood or  memory.        No outpatient medications have been marked as taking for the 01/25/23 encounter (Appointment) with Catherine Long, Catherine Long.               Objective:   Physical Exam   wts  01/25/2023        ***  07/13/2022     155  07/02/22 152 lb 9.6 oz (69.2 kg)  05/25/22 155 lb 12.8 oz (70.7 kg)  12/19/21 150 lb 9.6 oz (68.3 kg)  Wt 144 02/26/2012   Vital signs reviewed  01/25/2023  - Note at rest 02 sats  ***% on ***   General appearance:    ***      I personally reviewed images and agree with radiology impression as follows:   Chest CT w contrast 06/11/22 1. Partial consolidations at the lingula and right middle lobe with bronchiectasis and bronchial wall thickening. Fairly widespread bilateral areas of tree-in-bud density throughout the lungs, progressed compared to prior CT from 2013. Findings are consistent with chronic atypical infection such as MAI. Numerous small pulmonary nodules generally associated with the areas of tree-in-bud density and likely related to that infectious process, suggest 3 to six-month CT follow-up. 2. Interval enlargement of a fat density lesion in the right posterior chest wall, now measuring up to 7.2 cm, suspected to  represent large lipoma, but given growth compared to prior, could consider correlation with MRI to confirm. 3. 4.5 cm hypodense lesion in the right hepatic lobe, probably present on prior exam from 2013,  with patchy peripheral enhancement suggesting a hemangioma.     Assessment & Plan:

## 2023-01-25 ENCOUNTER — Ambulatory Visit: Payer: Medicare PPO | Admitting: Internal Medicine

## 2023-01-25 ENCOUNTER — Ambulatory Visit (HOSPITAL_COMMUNITY)
Admission: RE | Admit: 2023-01-25 | Discharge: 2023-01-25 | Disposition: A | Discharge status: Home / Self Care | Payer: Medicare PPO | Source: Ambulatory Visit | Attending: Internal Medicine | Admitting: Internal Medicine

## 2023-01-25 ENCOUNTER — Encounter: Payer: Self-pay | Admitting: Internal Medicine

## 2023-01-25 VITALS — BP 124/74 | HR 74 | Ht 63.0 in | Wt 150.8 lb

## 2023-01-25 DIAGNOSIS — R058 Other specified cough: Secondary | ICD-10-CM | POA: Diagnosis not present

## 2023-01-25 DIAGNOSIS — J479 Bronchiectasis, uncomplicated: Secondary | ICD-10-CM

## 2023-01-25 DIAGNOSIS — J4530 Mild persistent asthma, uncomplicated: Secondary | ICD-10-CM

## 2023-01-25 DIAGNOSIS — R918 Other nonspecific abnormal finding of lung field: Secondary | ICD-10-CM | POA: Diagnosis not present

## 2023-01-25 NOTE — Patient Instructions (Addendum)
If throat clearing worse then ok   to try Try prilosec otc 20mg   Take 30-60 min before first meal of the day and Pepcid ac (famotidine) 20 mg one @  bedtime until cough is completely gone for at least a week without the need for cough suppression  If still having problems, you are eligible for Reclast, a yearly IV infusion or Prolia twice yearly injections   Please remember to go to the  x-ray department  @  [facility] for your tests - we will call you with the results when they are available     Please schedule a follow up visit in 6 months but call sooner if needed r

## 2023-01-26 ENCOUNTER — Encounter: Payer: Self-pay | Admitting: Internal Medicine

## 2023-01-26 DIAGNOSIS — R058 Other specified cough: Secondary | ICD-10-CM | POA: Insufficient documentation

## 2023-01-26 NOTE — Assessment & Plan Note (Signed)
Onset of symptoms  2007     -06/14/12   allergy screen neg rast/ Eos 0.1 IgE < 2    alpha one AT phenotype  MM level 152    - PFTs 04/26/2012  FEV1  1.68 (75%) ratio 53 and no better p B2,  DLC0 92%   - Spirometry 06/01/2013  FEV1  1.81(76%) ratio 70 and no dulera since March 2014   - HFA 75% 06/14/2012    - 07/02/2022 rec  symbicort 80 2 qam and pm dose optional   All goals of chronic asthma control met including optimal function and elimination of symptoms with minimal need for rescue therapy.  Contingencies discussed in full including contacting this office immediately if not controlling the symptoms using the rule of two's.

## 2023-01-26 NOTE — Assessment & Plan Note (Signed)
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 Fairly widespread bilateral areas of tree-in-bud density throughout the lungs, progressed compared to prior CT from 2013 07/02/2022 rec levquin 500  mg q d x 7 days then return for baseline to baseline cxr comparisons - cxr 07/13/2022 only slt worse vs baseline  - 01/25/2023  After extensive coaching inhaler device,  effectiveness =  80%  No radiographic or clinical evidence of progression > no change rx

## 2023-01-26 NOTE — Assessment & Plan Note (Signed)
Onset ? 2024  - ? Better off fosamax 01/25/2023  > ok to rechallenge or change to reclast   Difficult to sort out this from bronchiectasis though the dry throat clearing is much more typical of Upper airway cough syndrome (previously labeled PNDS),  is so named because it's frequently impossible to sort out how much is  CR/sinusitis with freq throat clearing (which can be related to primary GERD)   vs  causing  secondary (" extra esophageal")  GERD from wide swings in gastric pressure that occur with throat clearing, often  promoting self use of mint and menthol lozenges that reduce the lower esophageal sphincter tone and exacerbate the problem further in a cyclical fashion.   These are the same pts (now being labeled as having "irritable larynx syndrome" by some cough centers) who not infrequently have a history of having failed to tolerate ace inhibitors,  dry powder inhalers or biphosphonates or report having atypical/extraesophageal reflux symptoms that don't respond to standard doses of PPI  and are easily confused as having aecopd or asthma flares by even experienced allergists/ pulmonologists (myself included).   Rec Try back on fosamax If throat clearing worse then ok   to try Try prilosec otc 20mg   Take 30-60 min before first meal of the day and Pepcid ac (famotidine) 20 mg one @  bedtime until cough is completely gone for at least a week without the need for cough suppression  If still having problems, you are eligible for Reclast, a yearly IV infusion or Prolia twice yearly injections   F/u here q 65m, sooner if needed         Each maintenance medication was reviewed in detail including emphasizing most importantly the difference between maintenance and prns and under what circumstances the prns are to be triggered using an action plan format where appropriate.  Total time for H and P, chart review, counseling, reviewing hfa device(s) and generating customized AVS unique to this office  visit / same day charting = 22 min

## 2023-03-23 DIAGNOSIS — L814 Other melanin hyperpigmentation: Secondary | ICD-10-CM | POA: Diagnosis not present

## 2023-03-23 DIAGNOSIS — Z85828 Personal history of other malignant neoplasm of skin: Secondary | ICD-10-CM | POA: Diagnosis not present

## 2023-03-23 DIAGNOSIS — L570 Actinic keratosis: Secondary | ICD-10-CM | POA: Diagnosis not present

## 2023-03-23 DIAGNOSIS — L578 Other skin changes due to chronic exposure to nonionizing radiation: Secondary | ICD-10-CM | POA: Diagnosis not present

## 2023-03-23 DIAGNOSIS — L821 Other seborrheic keratosis: Secondary | ICD-10-CM | POA: Diagnosis not present

## 2023-03-23 DIAGNOSIS — D485 Neoplasm of uncertain behavior of skin: Secondary | ICD-10-CM | POA: Diagnosis not present

## 2023-04-10 ENCOUNTER — Other Ambulatory Visit: Payer: Self-pay | Admitting: Family Medicine

## 2023-04-12 NOTE — Telephone Encounter (Signed)
Requested medication (s) are due for refill today: yes  Requested medication (s) are on the active medication list: yes  Last refill:  140/23/23 #12 3 RF  Future visit scheduled:yes  Notes to clinic:  no results for Vit D, Mg or phosphate   Requested Prescriptions  Pending Prescriptions Disp Refills   alendronate (FOSAMAX) 70 MG tablet [Pharmacy Med Name: Alendronate Sodium Oral Tablet 70 MG] 12 tablet 3    Sig: Take 1 tablet (70 mg total) by mouth every 7 (seven) days. Take with a full glass of water on an empty stomach.     Endocrinology:  Bisphosphonates Failed - 04/10/2023  1:51 AM      Failed - Vitamin D in normal range and within 360 days    No results found for: "GE9528UX3", "KG4010UV2", "VD125OH2TOT", "25OHVITD3", "25OHVITD2", "25OHVITD1", "VD25OH"       Failed - Mg Level in normal range and within 360 days    No results found for: "MG"       Failed - Phosphate in normal range and within 360 days    No results found for: "PHOS"       Failed - eGFR is 30 or above and within 360 days    GFR, Est African American  Date Value Ref Range Status  05/06/2020 94 > OR = 60 mL/min/1.72m2 Final   GFR, Est Non African American  Date Value Ref Range Status  05/06/2020 81 > OR = 60 mL/min/1.25m2 Final   eGFR  Date Value Ref Range Status  05/12/2021 76 > OR = 60 mL/min/1.46m2 Final    Comment:    The eGFR is based on the CKD-EPI 2021 equation. To calculate  the new eGFR from a previous Creatinine or Cystatin C result, go to https://www.kidney.org/professionals/ kdoqi/gfr%5Fcalculator          Failed - Valid encounter within last 12 months    Recent Outpatient Visits           1 year ago Need for immunization against influenza   [facility] Pickard, Priscille Heidelberg, MD   2 years ago Routine general medical examination at a health care facility   [facility] Pickard, Priscille Heidelberg, MD   3 years ago Routine general medical examination at a health care  facility   [facility] Pickard, Priscille Heidelberg, MD   4 years ago Dysuria   [facility] Donita Brooks, MD   5 years ago Bilateral low back pain without sciatica, unspecified chronicity   [facility] Donita Brooks, MD       Future Appointments             In 1 month Pickard, Priscille Heidelberg, MD [facility]            Failed - Bone Mineral Density or Dexa Scan completed in the last 2 years      Passed - Ca in normal range and within 360 days    Calcium  Date Value Ref Range Status  05/18/2022 9.5 8.6 - 10.4 mg/dL Final         Passed - Cr in normal range and within 360 days    Creat  Date Value Ref Range Status  05/18/2022 0.71 0.60 - 1.00 mg/dL Final

## 2023-05-24 ENCOUNTER — Other Ambulatory Visit: Payer: Medicare PPO

## 2023-05-25 ENCOUNTER — Other Ambulatory Visit: Payer: Medicare PPO

## 2023-05-25 DIAGNOSIS — Z0000 Encounter for general adult medical examination without abnormal findings: Secondary | ICD-10-CM | POA: Diagnosis not present

## 2023-05-26 LAB — COMPLETE METABOLIC PANEL WITH GFR
AG Ratio: 1.4 (calc) (ref 1.0–2.5)
ALT: 19 U/L (ref 6–29)
AST: 21 U/L (ref 10–35)
Albumin: 3.9 g/dL (ref 3.6–5.1)
Alkaline phosphatase (APISO): 65 U/L (ref 37–153)
BUN: 12 mg/dL (ref 7–25)
CO2: 28 mmol/L (ref 20–32)
Calcium: 9.6 mg/dL (ref 8.6–10.4)
Chloride: 102 mmol/L (ref 98–110)
Creat: 0.78 mg/dL (ref 0.60–1.00)
Globulin: 2.8 g/dL (ref 1.9–3.7)
Glucose, Bld: 93 mg/dL (ref 65–99)
Potassium: 4.5 mmol/L (ref 3.5–5.3)
Sodium: 137 mmol/L (ref 135–146)
Total Bilirubin: 0.4 mg/dL (ref 0.2–1.2)
Total Protein: 6.7 g/dL (ref 6.1–8.1)
eGFR: 81 mL/min/{1.73_m2} (ref >=60)

## 2023-05-26 LAB — LIPID PANEL
Cholesterol: 151 mg/dL (ref <200)
HDL: 66 mg/dL (ref >=50)
LDL Cholesterol (Calc): 62 mg/dL
Non-HDL Cholesterol (Calc): 85 mg/dL (ref <130)
Total CHOL/HDL Ratio: 2.3 (calc) (ref <5.0)
Triglycerides: 142 mg/dL (ref <150)

## 2023-05-26 LAB — CBC WITH DIFFERENTIAL/PLATELET
Absolute Lymphocytes: 1988 {cells}/uL (ref 850–3900)
Absolute Monocytes: 510 {cells}/uL (ref 200–950)
Basophils Absolute: 68 {cells}/uL (ref 0–200)
Basophils Relative: 0.9 %
Eosinophils Absolute: 180 {cells}/uL (ref 15–500)
Eosinophils Relative: 2.4 %
HCT: 41.9 % (ref 35.0–45.0)
Hemoglobin: 13.4 g/dL (ref 11.7–15.5)
MCH: 27.7 pg (ref 27.0–33.0)
MCHC: 32 g/dL (ref 32.0–36.0)
MCV: 86.6 fL (ref 80.0–100.0)
MPV: 9.3 fL (ref 7.5–12.5)
Monocytes Relative: 6.8 %
Neutro Abs: 4755 {cells}/uL (ref 1500–7800)
Neutrophils Relative %: 63.4 %
Platelets: 328 10*3/uL (ref 140–400)
RBC: 4.84 10*6/uL (ref 3.80–5.10)
RDW: 14.6 % (ref 11.0–15.0)
Total Lymphocyte: 26.5 %
WBC: 7.5 10*3/uL (ref 3.8–10.8)

## 2023-05-27 ENCOUNTER — Other Ambulatory Visit: Payer: Self-pay | Admitting: Hematology and Oncology

## 2023-05-27 ENCOUNTER — Encounter: Payer: Self-pay | Admitting: Family Medicine

## 2023-05-27 ENCOUNTER — Ambulatory Visit: Payer: Medicare PPO | Admitting: Family Medicine

## 2023-05-27 VITALS — BP 118/68 | HR 68 | Temp 98.0°F | Ht 63.0 in | Wt 153.0 lb

## 2023-05-27 DIAGNOSIS — M810 Age-related osteoporosis without current pathological fracture: Secondary | ICD-10-CM

## 2023-05-27 DIAGNOSIS — Z23 Encounter for immunization: Secondary | ICD-10-CM | POA: Diagnosis not present

## 2023-05-27 DIAGNOSIS — Z1231 Encounter for screening mammogram for malignant neoplasm of breast: Secondary | ICD-10-CM

## 2023-05-27 DIAGNOSIS — Z860100 Personal history of colon polyps, unspecified: Secondary | ICD-10-CM | POA: Insufficient documentation

## 2023-05-27 DIAGNOSIS — Z0000 Encounter for general adult medical examination without abnormal findings: Secondary | ICD-10-CM

## 2023-05-27 DIAGNOSIS — Z853 Personal history of malignant neoplasm of breast: Secondary | ICD-10-CM | POA: Diagnosis not present

## 2023-05-27 NOTE — Progress Notes (Signed)
Subjective:    Patient ID: Catherine Long, female    DOB: 08-15-1950, 72 y.o.   MRN: [id number]  HPI Patient is here today for complete physical exam.  Has a history of breast cancer/DCIS in 2021.  Last year due to shortness of breath CT scan of the lung was obtained which did show evidence of right middle lobe syndrome, bronchiectasis, and possible superimposed MAC.  Pulmonology was consulted and treated the patient with Symbicort suspecting upper airway cough syndrome coupled with bronchiectasis.  Recommended switching Fosamax to Prolia.  Patient states that her breathing is better.  She is concerned because she is having a hard time remembering people's names.  However her Mini-Mental status exam is normal.  She denies any concerning memory loss.  I believe that some of this is normal age-related memory changes but not consistent with dementia Immunization History  Administered Date(s) Administered   Fluad Quad(high Dose 65+) 06/20/2019, 05/14/2020, 05/16/2021, 05/25/2022   Influenza Split 04/26/2012   Influenza, High Dose Seasonal PF 05/20/2017, 05/23/2018   Influenza,inj,Quad PF,6+ Mos 06/01/2013, 05/28/2014, 05/15/2016   PFIZER(Purple Top)SARS-COV-2 Vaccination 09/10/2019, 10/05/2019   Pfizer Covid-19 Vaccine Bivalent Booster 63yrs & up 05/04/2022   Pneumococcal Conjugate-13 05/15/2016   Pneumococcal Polysaccharide-23 04/26/2012, 05/23/2018   Pneumococcal-Unspecified 05/24/2019   Zoster Recombinant(Shingrix) 10/05/2017, 12/26/2017   Zoster, Live 05/30/2014   Mammogram 2/24- normal DEXA 2/23- osteoporosis with T score -2.7 Colonoscopy 2016 -2 polyps  Her most recent lab work as listed below: Lab on 05/25/2023  Component Date Value Ref Range Status   WBC 05/25/2023 7.5  3.8 - 10.8 Thousand/uL Final   RBC 05/25/2023 4.84  3.80 - 5.10 Million/uL Final   Hemoglobin 05/25/2023 13.4  11.7 - 15.5 g/dL Final   HCT 40/05/2724 41.9  35.0 - 45.0 % Final   MCV 05/25/2023 86.6  80.0 -  100.0 fL Final   MCH 05/25/2023 27.7  27.0 - 33.0 pg Final   MCHC 05/25/2023 32.0  32.0 - 36.0 g/dL Final   Comment: For adults, a slight decrease in the calculated MCHC value (in the range of 30 to 32 g/dL) is most likely not clinically significant; however, it should be interpreted with caution in correlation with other red cell parameters and the patient's clinical condition.    RDW 05/25/2023 14.6  11.0 - 15.0 % Final   Platelets 05/25/2023 328  140 - 400 Thousand/uL Final   MPV 05/25/2023 9.3  7.5 - 12.5 fL Final   Neutro Abs 05/25/2023 4,755  1,500 - 7,800 cells/uL Final   Absolute Lymphocytes 05/25/2023 1,988  850 - 3,900 cells/uL Final   Absolute Monocytes 05/25/2023 510  200 - 950 cells/uL Final   Eosinophils Absolute 05/25/2023 180  15 - 500 cells/uL Final   Basophils Absolute 05/25/2023 68  0 - 200 cells/uL Final   Neutrophils Relative % 05/25/2023 63.4  % Final   Total Lymphocyte 05/25/2023 26.5  % Final   Monocytes Relative 05/25/2023 6.8  % Final   Eosinophils Relative 05/25/2023 2.4  % Final   Basophils Relative 05/25/2023 0.9  % Final   Glucose, Bld 05/25/2023 93  65 - 99 mg/dL Final   Comment: .            Fasting reference interval .    BUN 05/25/2023 12  7 - 25 mg/dL Final   Creat 36/64/4034 0.78  0.60 - 1.00 mg/dL Final   eGFR 74/25/9563 81  > OR = 60 mL/min/1.27m2 Final   BUN/Creatinine Ratio 05/25/2023  SEE NOTE:  6 - 22 (calc) Final   Comment:    Not Reported: BUN and Creatinine are within    reference range. .    Sodium 05/25/2023 137  135 - 146 mmol/L Final   Potassium 05/25/2023 4.5  3.5 - 5.3 mmol/L Final   Chloride 05/25/2023 102  98 - 110 mmol/L Final   CO2 05/25/2023 28  20 - 32 mmol/L Final   Calcium 05/25/2023 9.6  8.6 - 10.4 mg/dL Final   Total Protein 16/05/9603 6.7  6.1 - 8.1 g/dL Final   Albumin 54/04/8118 3.9  3.6 - 5.1 g/dL Final   Globulin 14/78/2956 2.8  1.9 - 3.7 g/dL (calc) Final   AG Ratio 05/25/2023 1.4  1.0 - 2.5 (calc) Final    Total Bilirubin 05/25/2023 0.4  0.2 - 1.2 mg/dL Final   Alkaline phosphatase (APISO) 05/25/2023 65  37 - 153 U/L Final   AST 05/25/2023 21  10 - 35 U/L Final   ALT 05/25/2023 19  6 - 29 U/L Final   Cholesterol 05/25/2023 151  <200 mg/dL Final   HDL 21/30/8657 66  > OR = 50 mg/dL Final   Triglycerides 84/69/6295 142  <150 mg/dL Final   LDL Cholesterol (Calc) 05/25/2023 62  mg/dL (calc) Final   Comment: Reference range: <100 . Desirable range <100 mg/dL for primary prevention;   <70 mg/dL for patients with CHD or diabetic patients  with > or = 2 CHD risk factors. Marland Kitchen LDL-C is now calculated using the Martin-Hopkins  calculation, which is a validated novel method providing  better accuracy than the Friedewald equation in the  estimation of LDL-C.  Horald Pollen et al. Lenox Ahr. 2841;324(40): 2061-2068  (http://education.[employer].com/faq/FAQ164)    Total CHOL/HDL Ratio 05/25/2023 2.3  <1.0 (calc) Final   Non-HDL Cholesterol (Calc) 05/25/2023 85  <130 mg/dL (calc) Final   Comment: For patients with diabetes plus 1 major ASCVD risk  factor, treating to a non-HDL-C goal of <100 mg/dL  (LDL-C of <27 mg/dL) is considered a therapeutic  option.     Past Medical History:  Diagnosis Date   Asthma    several years ago   Cancer [facility])    Phreesia 05/11/2020   Family history of breast cancer    Family history of melanoma    Family history of ovarian cancer    History of melanoma    Melanoma (HCC)    PONV (postoperative nausea and vomiting)    Right middle lobe syndrome     Past Surgical History:  Procedure Laterality Date   BREAST SURGERY N/A    Phreesia 05/11/2020   JOINT REPLACEMENT N/A    Phreesia 05/11/2020   RADIOACTIVE SEED GUIDED PARTIAL MASTECTOMY WITH AXILLARY SENTINEL LYMPH NODE BIOPSY Right 09/27/2019   Procedure: RIGHT RADIOACTIVE SEED GUIDED LUMPECTOMY WITH  SENTINEL LYMPH NODE BIOPSY;  Surgeon: Abigail Miyamoto, MD;  Location: [facility] OR;  Service: General;  Laterality:  Right;  LMA   TONSILLECTOMY     TOTAL HIP ARTHROPLASTY  2003 and 2006   Bilateral   Current Outpatient Medications on File Prior to Visit  Medication Sig Dispense Refill   alendronate (FOSAMAX) 70 MG tablet TAKE 1 TABLET (70 MG TOTAL) BY MOUTH EVERY 7 (SEVEN) DAYS. TAKE WITH A FULL GLASS OF WATER ON AN EMPTY STOMACH. 12 tablet 3   anastrozole (ARIMIDEX) 1 MG tablet Take 1 tablet (1 mg total) by mouth daily. 90 tablet 3   ascorbic acid (VITAMIN C) 1000 MG tablet Take 1,000 mg  by mouth daily.     B Complex-C (SUPER B COMPLEX) TABS Take 1 tablet by mouth daily.     budesonide-formoterol (SYMBICORT) 80-4.5 MCG/ACT inhaler Take 2 puffs first thing in am and then another 2 puffs about 12 hours later. 1 each 12   Calcium Carbonate-Vitamin D3 (CALCIUM 600/VITAMIN D) 600-400 MG-UNIT TABS Take 2 tablets by mouth daily.     Multiple Vitamins-Minerals (MULTI COMPLETE/IRON PO) Take 1 tablet by mouth daily.     Turmeric 500 MG CAPS Take 500 mg by mouth daily.      No current facility-administered medications on file prior to visit.   Allergies  Allergen Reactions   Latex Other (See Comments), Rash and Dermatitis   Social History   Socioeconomic History   Marital status: Widowed    Spouse name: Not on file   Number of children: 2   Years of education: Not on file   Highest education level: Not on file  Occupational History   Occupation: Retired  Tobacco Use   Smoking status: Never   Smokeless tobacco: Never  Vaping Use   Vaping status: Never Used  Substance and Sexual Activity   Alcohol use: Yes    Alcohol/week: 3.0 - 4.0 standard drinks of alcohol    Types: 3 - 4 Glasses of wine per week    Comment: Socially   Drug use: No   Sexual activity: Not on file  Other Topics Concern   Not on file  Social History Narrative   Not on file   Social Determinants of Health   Financial Resource Strain: Not on file  Food Insecurity: Not on file  Transportation Needs: Not on file  Physical  Activity: Not on file  Stress: Not on file  Social Connections: Not on file  Intimate Partner Violence: Not on file   Family History  Problem Relation Age of Onset   Heart disease Father    Melanoma Father        dx. 60s   Breast cancer Mother        bilateral, dx. mid-50s   Melanoma Mother        dx. 36s   Melanoma Sister        dx. 40s   Breast cancer Maternal Aunt        dx. >50   Ovarian cancer Maternal Aunt        dx. >50      Review of Systems  All other systems reviewed and are negative.      Objective:   Physical Exam Vitals reviewed.  Constitutional:      General: She is not in acute distress.    Appearance: She is well-developed. She is not diaphoretic.  HENT:     Head: Normocephalic and atraumatic.     Right Ear: External ear normal.     Left Ear: External ear normal.     Nose: Nose normal.     Mouth/Throat:     Pharynx: No oropharyngeal exudate.  Eyes:     General: No scleral icterus.       Right eye: No discharge.        Left eye: No discharge.     Conjunctiva/sclera: Conjunctivae normal.     Pupils: Pupils are equal, round, and reactive to light.  Neck:     Thyroid: No thyromegaly.     Vascular: No JVD.     Trachea: No tracheal deviation.  Cardiovascular:     Rate and Rhythm: Normal rate  and regular rhythm.     Heart sounds: Normal heart sounds. No murmur heard.    No friction rub. No gallop.  Pulmonary:     Effort: Pulmonary effort is normal. No respiratory distress.     Breath sounds: Normal breath sounds. No stridor. No wheezing or rales.  Chest:     Chest wall: No tenderness.  Abdominal:     General: Bowel sounds are normal. There is no distension.     Palpations: Abdomen is soft. There is no mass.     Tenderness: There is no abdominal tenderness. There is no guarding or rebound.  Musculoskeletal:        General: No tenderness. Normal range of motion.     Cervical back: Normal range of motion and neck supple.  Lymphadenopathy:      Cervical: No cervical adenopathy.  Skin:    General: Skin is warm.     Coloration: Skin is not pale.     Findings: No erythema or rash.  Neurological:     Mental Status: She is alert and oriented to person, place, and time.     Cranial Nerves: No cranial nerve deficit.     Motor: No abnormal muscle tone.     Coordination: Coordination normal.     Deep Tendon Reflexes: Reflexes are normal and symmetric.  Psychiatric:        Behavior: Behavior normal.        Thought Content: Thought content normal.        Judgment: Judgment normal.           Assessment & Plan:  Routine general medical examination at a health care facility  History of breast cancer  Osteoporosis without current pathological fracture, unspecified osteoporosis type Physical exam is completely normal.  Mammogram is up-to-date colonoscopy is due in 2026.  Repeat bone density test next year.  Patient received her flu shot today.  Recommended a COVID shot.  Discontinue Fosamax and replace with Prolia every 6 months.  Continue calcium and vitamin D.

## 2023-05-27 NOTE — Addendum Note (Signed)
Addended by: Venia Carbon K on: 05/27/2023 10:53 AM   Modules accepted: Orders

## 2023-06-01 DIAGNOSIS — D485 Neoplasm of uncertain behavior of skin: Secondary | ICD-10-CM | POA: Diagnosis not present

## 2023-06-01 DIAGNOSIS — Z85828 Personal history of other malignant neoplasm of skin: Secondary | ICD-10-CM | POA: Diagnosis not present

## 2023-06-01 DIAGNOSIS — C44729 Squamous cell carcinoma of skin of left lower limb, including hip: Secondary | ICD-10-CM | POA: Diagnosis not present

## 2023-06-16 ENCOUNTER — Telehealth: Payer: Self-pay

## 2023-06-16 NOTE — Telephone Encounter (Signed)
Copied from CRM [id number]. Topic: Clinical - Medication Question >> Jun 16, 2023  1:40 PM Alcus Dad H wrote: Reason for CRM:  Pt was calling to see if the change from Fosamax to Prolia was able to be completed and if so, when will she get the prolia.

## 2023-06-18 ENCOUNTER — Other Ambulatory Visit: Payer: Self-pay

## 2023-06-18 ENCOUNTER — Encounter (HOSPITAL_COMMUNITY): Payer: Self-pay

## 2023-06-18 DIAGNOSIS — M8580 Other specified disorders of bone density and structure, unspecified site: Secondary | ICD-10-CM

## 2023-06-18 DIAGNOSIS — M810 Age-related osteoporosis without current pathological fracture: Secondary | ICD-10-CM

## 2023-06-18 MED ORDER — DENOSUMAB 60 MG/ML ~~LOC~~ SOSY
60.0000 mg | PREFILLED_SYRINGE | Freq: Once | SUBCUTANEOUS | 0 refills | Status: AC
  Filled 2023-06-18: qty 1, 1d supply, fill #0
  Filled 2023-06-24 – 2023-06-28 (×3): qty 1, 180d supply, fill #0

## 2023-06-21 ENCOUNTER — Telehealth: Payer: Self-pay

## 2023-06-21 ENCOUNTER — Other Ambulatory Visit (HOSPITAL_COMMUNITY): Payer: Self-pay

## 2023-06-21 ENCOUNTER — Other Ambulatory Visit: Payer: Self-pay

## 2023-06-21 NOTE — Telephone Encounter (Signed)
Catherine Long (Key: [id number])  Your information has been sent to [facility]

## 2023-06-22 ENCOUNTER — Other Ambulatory Visit (HOSPITAL_COMMUNITY): Payer: Self-pay

## 2023-06-24 ENCOUNTER — Other Ambulatory Visit: Payer: Self-pay

## 2023-06-25 ENCOUNTER — Other Ambulatory Visit: Payer: Self-pay

## 2023-06-25 NOTE — Telephone Encounter (Signed)
PA-Prolia rec' from [facility], it has been approved for the amount listed on pt's plan. PA is not required. Msg pt via her MyChart.

## 2023-06-28 ENCOUNTER — Other Ambulatory Visit: Payer: Self-pay

## 2023-06-28 ENCOUNTER — Telehealth: Payer: Self-pay

## 2023-06-28 NOTE — Telephone Encounter (Signed)
Copied from CRM [id number]. Topic: Clinical - Medication Question >> Jun 28, 2023  1:29 PM Lars Mage H wrote: Reason for CRM: They would like to confirm that the office has the means to store the patient's Proleaha injection - this medication has to be sent to the office for administration. Please call to confirm.  Spoke with Tiffany at [facility]. I informed her that we will be able to store the medication.

## 2023-06-28 NOTE — Progress Notes (Signed)
Specialty Pharmacy Initial Fill Coordination Note  Catherine Long is a 72 y.o. female contacted today regarding initial fill of specialty medication(s) Denosumab   Patient requested Courier to Provider Office   Delivery date: 07/06/23   Verified address: [place] [facility] [id number] [place]   Medication will be filled on 12/2.   Patient is aware of $64 copayment.

## 2023-06-28 NOTE — Telephone Encounter (Signed)
Copied from CRM [id number]. Topic: Clinical - Medication Question >> Jun 24, 2023 10:22 AM Donita Brooks wrote: Reason for CRM: Tiffany from pharmacy is calling in for [employer] prolia injection. To send the medication directly to office and to see if we can store the medication for her until her next appointment. Elmarie Shiley can be reach at [phone]

## 2023-07-05 ENCOUNTER — Other Ambulatory Visit: Payer: Self-pay

## 2023-07-08 ENCOUNTER — Telehealth: Payer: Self-pay

## 2023-07-08 ENCOUNTER — Other Ambulatory Visit: Payer: Self-pay | Admitting: Family Medicine

## 2023-07-08 MED ORDER — AZITHROMYCIN 250 MG PO TABS: ORAL_TABLET | ORAL | 0 refills | Status: DC

## 2023-07-08 NOTE — Telephone Encounter (Signed)
Copied from CRM [id number]. Topic: Clinical - Medical Advice >> Jul 08, 2023  8:57 AM Amy B wrote: Reason for CRM: Patient has severe coughing and is coughing up phlegm.  She has been using her inhaler throughout the day.  She has an appointment scheduled for tomorrow morning but would like to know if she can have a prescription today.  She requests a call back to discuss, [phone]

## 2023-07-09 ENCOUNTER — Telehealth: Payer: Self-pay | Admitting: Family Medicine

## 2023-07-09 ENCOUNTER — Ambulatory Visit (INDEPENDENT_AMBULATORY_CARE_PROVIDER_SITE_OTHER): Payer: Medicare PPO

## 2023-07-09 DIAGNOSIS — M810 Age-related osteoporosis without current pathological fracture: Secondary | ICD-10-CM

## 2023-07-09 MED ORDER — DENOSUMAB 60 MG/ML ~~LOC~~ SOSY
60.0000 mg | PREFILLED_SYRINGE | Freq: Once | SUBCUTANEOUS | Status: AC
  Administered 2023-07-09: 60 mg via SUBCUTANEOUS

## 2023-07-09 NOTE — Telephone Encounter (Signed)
Copied from CRM [id number]. Topic: Clinical - Medical Advice >> Jul 08, 2023 12:21 PM Fonda Kinder J wrote: Reason for CRM: Patient is returning a call from Renee Pain to discuss crm [phone]

## 2023-07-21 DIAGNOSIS — H2513 Age-related nuclear cataract, bilateral: Secondary | ICD-10-CM | POA: Diagnosis not present

## 2023-07-21 DIAGNOSIS — H43813 Vitreous degeneration, bilateral: Secondary | ICD-10-CM | POA: Diagnosis not present

## 2023-07-21 DIAGNOSIS — H04123 Dry eye syndrome of bilateral lacrimal glands: Secondary | ICD-10-CM | POA: Diagnosis not present

## 2023-08-04 NOTE — Progress Notes (Signed)
 Subjective:    Patient ID: Catherine Long, female    DOB: Nov 08, 1950   MRN: [id number]   Brief patient profile:  50 yowf MM with no significant smoking hx with tendency to cough esp doing yardwork since around 2007 assoc with sinus complaints then chronic cough developed in 2012 referred 02/26/2012 to pulmonary clinic 02/26/12 for ? MAI    History of Present Illness   02/26/2012 1st pulmonary ov cc now  daily cough x one year intermittently green mod thick but no more than a few tsp no assoc sob or sinus complaints or hb despite fish oil x 10 years. Cough is worse first thing in am but doesn't wake her.  rec  If mucus turns really green and nasty best choice is levaquin  750 mg one daily x 5 days GERD diet.   04/26/2012 f/u ov/Honore Wipperfurth cc cough gone to her satisfaction min in am's min discolored, no sob. Did not take levaquin , not using mucinex. rec Start dulera 100 Take 2 puffs first thing in am and then another 2 puffs about 12 hours later.  Work on inhaler technique:   Pneumovax and influenza given     01/25/2023  6 m f/u ov/[place] office/Bresha Hosack re: asthma/ RML syndrome/bronchiectasis  maint on prn symb 80   - needs repeat cxr for ? Worse bronchiectasis on priors Chief Complaint  Patient presents with   Follow-up    Breathing is overall doing well. She has had minimal cough- non prod. She has not had to use her symbicort  recently.   Dyspnea:  not limited even doing power walks s hills  Cough: minimal > white  Sleeping: flat bed one pillow no resp cc  SABA use: none  02: none  Rec If throat clearing worse then ok   to try Try prilosec otc 20mg   Take 30-60 min before first meal of the day and Pepcid ac (famotidine) 20 mg one @  bedtime until cough is completely gone for at least a week without the need for cough suppression If still having problems, you are eligible for Reclast, a yearly IV infusion or Prolia  twice yearly injections    08/05/2023  6 m f/u ov/[place] office/Mickeal Daws re:  AB/ bronchiectasis  maint on symbicort  80  off biphosphonates as of Dec 2024 but not on gerd rx yet. Chief Complaint  Patient presents with   Follow-up    Breathing is overall doing well. She has a cough in the am- non prod. She is using her symbicort  as needed- at least once per wk.   Dyspnea:  power walks / with some hills tol ok  Cough: minimal mucoid in am x 30 min max/ never bloody or purulent  Sleeping: flat bed  one bed s  resp cc  SABA use: none  02: none      No obvious day to day or daytime variability or assoc excess/ purulent sputum or mucus plugs or hemoptysis or cp or chest tightness, subjective wheeze or overt sinus or hb symptoms.    Also denies any obvious fluctuation of symptoms with weather or environmental changes or other aggravating or alleviating factors except as outlined above   No unusual exposure hx or h/o childhood pna/ asthma or knowledge of premature birth.  Current Allergies, Complete Past Medical History, Past Surgical History, Family History, and Social History were reviewed in [employer] record.  ROS  The following are not active complaints unless bolded Hoarseness, sore throat, dysphagia, dental problems, itching, sneezing,  nasal congestion or discharge of excess mucus or purulent secretions, ear ache,   fever, chills, sweats, unintended wt loss or wt gain, classically pleuritic or exertional cp,  orthopnea pnd or arm/hand swelling  or leg swelling, presyncope, palpitations, abdominal pain, anorexia, nausea, vomiting, diarrhea  or change in bowel habits or change in bladder habits, change in stools or change in urine, dysuria, hematuria,  rash, arthralgias, visual complaints, headache, numbness, weakness or ataxia or problems with walking or coordination,  change in mood or  memory.        Current Meds  Medication Sig   anastrozole  (ARIMIDEX ) 1 MG tablet Take 1 tablet (1 mg total) by mouth daily.   ascorbic acid (VITAMIN C) 1000  MG tablet Take 1,000 mg by mouth daily.   B Complex-C (SUPER B COMPLEX) TABS Take 1 tablet by mouth daily.   Calcium Carbonate-Vitamin D3 (CALCIUM 600/VITAMIN D ) 600-400 MG-UNIT TABS Take 2 tablets by mouth daily.   denosumab  (PROLIA ) 60 MG/ML SOSY injection Inject 60 mg into the skin every 6 (six) months.   Multiple Vitamins-Minerals (MULTI COMPLETE/IRON PO) Take 1 tablet by mouth daily.   [DISCONTINUED] budesonide -formoterol  (SYMBICORT ) 80-4.5 MCG/ACT inhaler Take 2 puffs first thing in am and then another 2 puffs about 12 hours later.                  Objective:   Physical Exam   wts    01/25/2023       150  07/13/2022     155  07/02/22 152 lb 9.6 oz (69.2 kg)  05/25/22 155 lb 12.8 oz (70.7 kg)  12/19/21 150 lb 9.6 oz (68.3 kg)  Wt 144 02/26/2012   Vital signs reviewed  08/05/2023  - Note at rest 02 sats  95% on RA   General appearance:    pleasant amb wf nad    HEENT : Oropharynx  clear         NECK :  without  apparent JVD/ palpable Nodes/TM    LUNGS: no acc muscle use,  Nl contour chest which is clear to A and P bilaterally without cough on insp or exp maneuvers   CV:  RRR  no s3 or murmur or increase in P2, and no edema   ABD:  soft and nontender   MS:  Gait nl   ext warm without deformities Or obvious joint restrictions  calf tenderness, cyanosis or clubbing    SKIN: warm and dry without lesions    NEURO:  alert, approp, nl sensorium with  no motor or cerebellar deficits apparent.    CXR PA and Lateral:   08/05/2023 :    I personally reviewed images and impression is as follows:     Scarring lingula > RML / no acute changes    Assessment & Plan:

## 2023-08-05 ENCOUNTER — Other Ambulatory Visit: Payer: Self-pay

## 2023-08-05 ENCOUNTER — Ambulatory Visit (HOSPITAL_COMMUNITY)
Admission: RE | Admit: 2023-08-05 | Discharge: 2023-08-05 | Disposition: A | Discharge status: Home / Self Care | Payer: Medicare PPO | Source: Ambulatory Visit | Attending: Internal Medicine | Admitting: Internal Medicine

## 2023-08-05 ENCOUNTER — Encounter: Payer: Self-pay | Admitting: Internal Medicine

## 2023-08-05 ENCOUNTER — Telehealth: Payer: Self-pay

## 2023-08-05 ENCOUNTER — Ambulatory Visit: Payer: Medicare PPO | Admitting: Internal Medicine

## 2023-08-05 VITALS — BP 122/68 | HR 86

## 2023-08-05 DIAGNOSIS — J4530 Mild persistent asthma, uncomplicated: Secondary | ICD-10-CM | POA: Diagnosis not present

## 2023-08-05 DIAGNOSIS — R059 Cough, unspecified: Secondary | ICD-10-CM | POA: Diagnosis not present

## 2023-08-05 DIAGNOSIS — M810 Age-related osteoporosis without current pathological fracture: Secondary | ICD-10-CM

## 2023-08-05 DIAGNOSIS — J479 Bronchiectasis, uncomplicated: Secondary | ICD-10-CM | POA: Insufficient documentation

## 2023-08-05 MED ORDER — BUDESONIDE-FORMOTEROL FUMARATE 80-4.5 MCG/ACT IN AERO: INHALATION_SPRAY | RESPIRATORY_TRACT | 12 refills | Status: AC

## 2023-08-05 NOTE — Assessment & Plan Note (Addendum)
 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 Fairly widespread bilateral areas of tree-in-bud density throughout the lungs, progressed compared to prior CT from 2013 - cxr 08/05/2023 no change scarring Lingula > RML    This is an extremely common benign condition in the elderly and does not warrant aggressive eval/ rx at this point unless there is a clinical correlation suggesting unaddressed pulmonary infection (purulent sputum, night sweats, unintended wt loss, doe) or evolution of  obvious changes on plain cxr (as opposed to serial CT, which is way over sensitive to make clinical decisions re intervention and treatment in the elderly, who tend to tolerate both dx and treatment poorly) .  In fact, in study published July 2024 in ATS,  patients with bronchiectasis with vs without proven atypical TB had similar outcomes x 5 years of the study, in temis of admits/loss of lung function, exac and death.   F/u can be q 6 m, sooner prn          Each maintenance medication was reviewed in detail including emphasizing most importantly the difference between maintenance and prns and under what circumstances the prns are to be triggered using an action plan format where appropriate.  Total time for H and P, chart review, counseling, reviewing hfa device(s) and generating customized AVS unique to this office visit / same day charting > 30 min

## 2023-08-05 NOTE — Patient Instructions (Addendum)
 Take symbicort  80 2pffs 1st thing am or at bedtime to see what effect it has on the am cough.  If still coughing >  Try prilosec otc 20mg   Take 30-60 min before first meal of the day and Pepcid ac (famotidine) 20 mg one @  bedtime until cough is completely gone for at least a week without the need for cough suppression   Please remember to go to the  x-ray department  @  [facility] for your tests - we will call you with the results when they are available      Please schedule a follow up visit in 9  months but call sooner if needed

## 2023-08-05 NOTE — Assessment & Plan Note (Addendum)
 Onset of symptoms  2007     -06/14/12   allergy  screen neg rast/ Eos 0.1 IgE < 2    alpha one AT phenotype  MM level 152    - PFTs 04/26/2012  FEV1  1.68 (75%) ratio 53 and no better p B2,  DLC0 92%   - Spirometry 06/01/2013  FEV1  1.81(76%) ratio 70 and no dulera since March 2014   - HFA 75% 06/14/2012    - 07/02/2022 rec  symbicort  80 2 qam and pm dose optional   >>>  08/05/2023  After extensive coaching inhaler device,  effectiveness =    75% short ti > suggested try to use for cough up to 2 puffs q 12 h prn for at least a week and if not better add gerd rx otc   Based on two studies from NEJM  378; 20 p 1865 (2018) and 380 : p2020-30 (2019) in pts with mild asthma it is reasonable to use low dose symbicort  eg 80 2bid prn flare in this setting but I emphasized this was only shown with symbicort  and takes advantage of the rapid onset of action but is not the same as rescue therapy but can be stopped once the acute symptoms have resolved and the need for rescue has been minimized (< 2 x weekly)

## 2023-08-05 NOTE — Telephone Encounter (Signed)
 Copied from CRM [id number]. Topic: Clinical - Medical Advice >> Aug 05, 2023 12:02 PM Elle L wrote: Reason for CRM: The patient is requesting a bone density test order be sent to [facility]. Their phone number is [phone].

## 2023-09-07 DIAGNOSIS — D1801 Hemangioma of skin and subcutaneous tissue: Secondary | ICD-10-CM | POA: Diagnosis not present

## 2023-09-07 DIAGNOSIS — C44729 Squamous cell carcinoma of skin of left lower limb, including hip: Secondary | ICD-10-CM | POA: Diagnosis not present

## 2023-09-07 DIAGNOSIS — C44311 Basal cell carcinoma of skin of nose: Secondary | ICD-10-CM | POA: Diagnosis not present

## 2023-09-07 DIAGNOSIS — C44722 Squamous cell carcinoma of skin of right lower limb, including hip: Secondary | ICD-10-CM | POA: Diagnosis not present

## 2023-09-07 DIAGNOSIS — L814 Other melanin hyperpigmentation: Secondary | ICD-10-CM | POA: Diagnosis not present

## 2023-09-07 DIAGNOSIS — L821 Other seborrheic keratosis: Secondary | ICD-10-CM | POA: Diagnosis not present

## 2023-09-07 DIAGNOSIS — D485 Neoplasm of uncertain behavior of skin: Secondary | ICD-10-CM | POA: Diagnosis not present

## 2023-09-07 DIAGNOSIS — Z85828 Personal history of other malignant neoplasm of skin: Secondary | ICD-10-CM | POA: Diagnosis not present

## 2023-09-07 DIAGNOSIS — L570 Actinic keratosis: Secondary | ICD-10-CM | POA: Diagnosis not present

## 2023-09-09 ENCOUNTER — Other Ambulatory Visit: Payer: Self-pay | Admitting: Hematology and Oncology

## 2023-09-21 ENCOUNTER — Ambulatory Visit: Payer: Medicare PPO

## 2023-09-21 ENCOUNTER — Ambulatory Visit (HOSPITAL_BASED_OUTPATIENT_CLINIC_OR_DEPARTMENT_OTHER)
Admission: RE | Admit: 2023-09-21 | Discharge: 2023-09-21 | Disposition: A | Discharge status: Home / Self Care | Payer: Medicare PPO | Source: Ambulatory Visit | Attending: Hematology and Oncology | Admitting: Hematology and Oncology

## 2023-09-21 ENCOUNTER — Ambulatory Visit (HOSPITAL_BASED_OUTPATIENT_CLINIC_OR_DEPARTMENT_OTHER)
Admission: RE | Admit: 2023-09-21 | Discharge: 2023-09-21 | Disposition: A | Discharge status: Home / Self Care | Payer: Medicare PPO | Source: Ambulatory Visit | Attending: Family Medicine | Admitting: Family Medicine

## 2023-09-21 ENCOUNTER — Encounter (HOSPITAL_BASED_OUTPATIENT_CLINIC_OR_DEPARTMENT_OTHER): Payer: Self-pay | Admitting: Radiology

## 2023-09-21 DIAGNOSIS — M810 Age-related osteoporosis without current pathological fracture: Secondary | ICD-10-CM | POA: Insufficient documentation

## 2023-09-21 DIAGNOSIS — M8588 Other specified disorders of bone density and structure, other site: Secondary | ICD-10-CM | POA: Diagnosis not present

## 2023-09-21 DIAGNOSIS — Z1231 Encounter for screening mammogram for malignant neoplasm of breast: Secondary | ICD-10-CM

## 2023-10-05 ENCOUNTER — Inpatient Hospital Stay: Payer: Medicare PPO | Attending: Hematology and Oncology | Admitting: Hematology and Oncology

## 2023-10-05 VITALS — BP 138/58 | HR 81 | Temp 98.5°F | Resp 20 | Ht 63.0 in | Wt 151.9 lb

## 2023-10-05 DIAGNOSIS — Z79811 Long term (current) use of aromatase inhibitors: Secondary | ICD-10-CM | POA: Diagnosis not present

## 2023-10-05 DIAGNOSIS — Z170 Estrogen receptor positive status [ER+]: Secondary | ICD-10-CM | POA: Diagnosis not present

## 2023-10-05 DIAGNOSIS — C50211 Malignant neoplasm of upper-inner quadrant of right female breast: Secondary | ICD-10-CM | POA: Diagnosis not present

## 2023-10-05 NOTE — Assessment & Plan Note (Signed)
 09/07/2019:Screening mammogram detected a possible right breast mass. Diagnostic mammogram showed two adjacent and nearly contiguous masses at the 2 o'clock position, 1.7cm, no right axillary adenopathy. Biopsy showed invasive mammary carcinoma, grade 2, HER-2 equivocal by IHC, negative by FISH, ER+ 95%, PR+ 90%, Ki67 2%. T1CN0 stage Ia clinical stage   Right lumpectomy 09/27/2019: Grade 2 IDC 1.5 cm with intermediate grade DCIS, margins negative, 0/4 lymph nodes negative, ER 95%, PR 90%, HER-2 negative, Ki-67 2% Oncotype score: 9, distant recurrence at 9 years: 3% Upbeat clinical trial: No adverse effects to participating in the study   Adjuvant radiation: 11/08/2019-12/04/2019    Recommendation: Adjuvant antiestrogen therapy with anastrozole 1 mg daily x5 to 7 years started 12/21/19   Anastrozole Toxicities: Denies any major adverse effects to anastrozole therapy.  Denies any hot flashes or arthralgias or myalgias. Bone density 09/21/2023: T-score -1.2 (January 2021: T score -1.9): Osteopenia   Breast Cancer Surveillance: 1. Breast Exam: 10/05/2023: Benign 2. Mammogram: 09/24/2023: Benign breast density category C. Biopsy 09/10/20: Fibroadenoma    RTC in 1 year for follow up

## 2023-10-05 NOTE — Progress Notes (Signed)
 Patient Care Team: Donita Brooks, MD as PCP - General (Family Medicine) Deon Pilling as Physician Assistant (Unknown Physician Specialty) Serena Croissant, MD as Consulting Physician (Hematology and Oncology) Lonie Peak, MD as Attending Physician (Radiation Oncology) Abigail Miyamoto, MD as Consulting Physician (General Surgery)  DIAGNOSIS:  Encounter Diagnosis  Name Primary?   Malignant neoplasm of upper-inner quadrant of right breast in female, estrogen receptor positive (HCC) Yes    SUMMARY OF ONCOLOGIC HISTORY: Oncology History  Malignant neoplasm of upper-inner quadrant of right breast in female, estrogen receptor positive (HCC)  09/07/2019 Initial Diagnosis   Screening mammogram detected a possible right breast mass. Diagnostic mammogram showed two adjacent and nearly contiguous masses at the 2 o'clock position, 1.7cm, no right axillary adenopathy. Biopsy showed invasive mammary carcinoma, grade 2, HER-2 equivocal by IHC, negative by FISH, ER+ 95%, PR+ 90%, Ki67 2%.   09/19/2019 Genetic Testing   Negative genetic testing:  No pathogenic variants detected on the [facility] Breast Cancer STAT panel, Common Hereditary Cancers panel, or Melanoma panel. A variant of uncertain significance (VUS) was detected in the POLE gene called c.1357C>G. The report date is 09/19/2019.  UPDATE:  The POLE c.1357C>G VUS was reclassified to "likely benign" on 02/22/2020.  The STAT Breast cancer panel offered by [facility] includes sequencing and rearrangement analysis for the following 9 genes:  ATM, BRCA1, BRCA2, CDH1, CHEK2, PALB2, PTEN, STK11 and TP53. The Common Hereditary Cancers Panel offered by [facility] includes sequencing and/or deletion duplication testing of the following 48 genes: APC, ATM, AXIN2, BARD1, BMPR1A, BRCA1, BRCA2, BRIP1, CDH1, CDK4, CDKN2A (p14ARF), CDKN2A (p16INK4a), CHEK2, CTNNA1, DICER1, EPCAM (Deletion/duplication testing only), GREM1 (promoter region deletion/duplication  testing only), KIT, MEN1, MLH1, MSH2, MSH3, MSH6, MUTYH, NBN, NF1, NHTL1, PALB2, PDGFRA, PMS2, POLD1, POLE, PTEN, RAD50, RAD51C, RAD51D, RNF43, SDHB, SDHC, SDHD, SMAD4, SMARCA4. STK11, TP53, TSC1, TSC2, and VHL.  The following genes were evaluated for sequence changes only: SDHA and HOXB13 c.251G>A variant only. The Melanoma panel offered by [facility] includes sequencing and/or deletion duplication testing of the following 9 genes: BAP1, BRCA2, BRIP1, CDK4, CDKN2A (p14ARF), CDKN2A (p16INK4a), POT1, PTEN, RB1, and TP53.  The following gene was evaluated for sequence changes only: MITF (c.952G>A, p.Glu318Lys variant only).    09/27/2019 Surgery   Right lumpectomy Magnus Ivan) [phone]): Grade 2 IDC 1.5 cm with intermediate grade DCIS, margins negative, 4 lymph nodes negative, ER 95%, PR 90%, HER-2 negative, Ki-67 2%   10/04/2019 Cancer Staging   Staging form: Breast, AJCC 8th Edition - Pathologic stage from 10/04/2019: Stage IA (pT1c, pN0, cM0, G2, ER+, PR+, HER2-)    10/11/2019 Oncotype testing   The Oncotype DX score was 9 predicting a risk of outside the breast recurrence over the next 9 years of 3% if the patient's only systemic therapy is tamoxifen for 5 years.    11/07/2019 - 12/04/2019 Radiation Therapy   The patient initially received a dose of 40.05 Gy in 15 fractions to the breast using whole-breast tangent fields. This was delivered using a 3-D conformal technique. The pt received a boost delivering an additional 10 Gy in 5 fractions using a electron boost with electrons. The total dose was 50.05 Gy.   12/2019 - 12/2024 Anti-estrogen oral therapy   Anastrozole     CHIEF COMPLIANT: F/U on anastrozole  HISTORY OF PRESENT ILLNESS:   History of Present Illness The patient, with a history of breast cancer and osteoporosis, reports overall good health. She occasionally experiences sensations in the arm and a  feeling of "butterflies" in the chest, which she describes as similar to  pre-performance nerves. The patient has been adhering to her medication regimen, which includes anastrozole for breast cancer and Prolia for osteoporosis. She switched to Prolia from Fosamax in December due to a persistent dry cough, which her pulmonologist suggested might be a side effect of Fosamax. The patient's recent mammogram and bone density test results were satisfactory. The bone density has improved since starting Fosamax and continuing with Prolia, and is now close to normal. The patient has not experienced any issues with anastrozole.     ALLERGIES:  is allergic to latex.  MEDICATIONS:  Current Outpatient Medications  Medication Sig Dispense Refill   anastrozole (ARIMIDEX) 1 MG tablet TAKE 1 TABLET EVERY DAY 90 tablet 3   ascorbic acid (VITAMIN C) 1000 MG tablet Take 1,000 mg by mouth daily.     B Complex-C (SUPER B COMPLEX) TABS Take 1 tablet by mouth daily.     budesonide-formoterol (SYMBICORT) 80-4.5 MCG/ACT inhaler Take 2 puffs first thing in am and then another 2 puffs about 12 hours later. 1 each 12   Calcium Carbonate-Vitamin D3 (CALCIUM 600/VITAMIN D) 600-400 MG-UNIT TABS Take 2 tablets by mouth daily.     denosumab (PROLIA) 60 MG/ML SOSY injection Inject 60 mg into the skin every 6 (six) months.     Multiple Vitamins-Minerals (MULTI COMPLETE/IRON PO) Take 1 tablet by mouth daily.     No current facility-administered medications for this visit.    PHYSICAL EXAMINATION: ECOG PERFORMANCE STATUS: 1 - Symptomatic but completely ambulatory  There were no vitals filed for this visit. There were no vitals filed for this visit.  Physical Exam   (exam performed in the presence of a chaperone)  LABORATORY DATA:  I have reviewed the data as listed    Latest Ref Rng & Units 05/25/2023    8:25 AM 05/18/2022    8:24 AM 05/12/2021    8:03 AM  CMP  Glucose 65 - 99 mg/dL 93  90  93   BUN 7 - 25 mg/dL 12  9  15    Creatinine 0.60 - 1.00 mg/dL 1.61  0.96  0.45   Sodium 135 -  146 mmol/L 137  138  139   Potassium 3.5 - 5.3 mmol/L 4.5  4.5  4.6   Chloride 98 - 110 mmol/L 102  102  102   CO2 20 - 32 mmol/L 28  28  30    Calcium 8.6 - 10.4 mg/dL 9.6  9.5  9.4   Total Protein 6.1 - 8.1 g/dL 6.7  6.9  6.7   Total Bilirubin 0.2 - 1.2 mg/dL 0.4  0.3  0.5   AST 10 - 35 U/L 21  19  17    ALT 6 - 29 U/L 19  12  15      Lab Results  Component Value Date   WBC 7.5 05/25/2023   HGB 13.4 05/25/2023   HCT 41.9 05/25/2023   MCV 86.6 05/25/2023   PLT 328 05/25/2023   NEUTROABS 4,755 05/25/2023    ASSESSMENT & PLAN:  Malignant neoplasm of upper-inner quadrant of right breast in female, estrogen receptor positive (HCC) 09/07/2019:Screening mammogram detected a possible right breast mass. Diagnostic mammogram showed two adjacent and nearly contiguous masses at the 2 o'clock position, 1.7cm, no right axillary adenopathy. Biopsy showed invasive mammary carcinoma, grade 2, HER-2 equivocal by IHC, negative by FISH, ER+ 95%, PR+ 90%, Ki67 2%. T1CN0 stage Ia clinical stage   Right  lumpectomy 09/27/2019: Grade 2 IDC 1.5 cm with intermediate grade DCIS, margins negative, 0/4 lymph nodes negative, ER 95%, PR 90%, HER-2 negative, Ki-67 2% Oncotype score: 9, distant recurrence at 9 years: 3% Upbeat clinical trial: No adverse effects to participating in the study   Adjuvant radiation: 11/08/2019-12/04/2019    Recommendation: Adjuvant antiestrogen therapy with anastrozole 1 mg daily x5 to 7 years started 12/21/19   Anastrozole Toxicities: Denies any major adverse effects to anastrozole therapy.  Denies any hot flashes or arthralgias or myalgias. Bone density 09/21/2023: T-score -1.2 (January 2021: T score -1.9): Osteopenia   Breast Cancer Surveillance: 1. Breast Exam: 10/05/2023: Benign 2. Mammogram: 09/24/2023: Benign breast density category C. Biopsy 09/10/20: Fibroadenoma    RTC in 1 year for follow up ------------------------------------- Assessment and Plan Assessment & Plan Breast  Cancer Four years post-diagnosis, tolerating Anastrozole well with no reported side effects. Recent mammogram results were good. -Continue Anastrozole, extending treatment to seven years per new guidelines. -Plan for annual follow-up.  Osteoporosis Improvement in bone density noted, likely due to Fosamax use. Recently switched to Prolia due to potential respiratory side effects from Fosamax. -Continue Prolia. -Monitor bone density to assess response to Prolia.  General Health Maintenance Regular exercise regimen in place. -Encourage continuation of regular exercise for overall health maintenance.      No orders of the defined types were placed in this encounter.  The patient has a good understanding of the overall plan. she agrees with it. she will call with any problems that may develop before the next visit here. Total time spent: 30 mins including face to face time and time spent for planning, charting and co-ordination of care   Tamsen Meek, MD 10/05/23

## 2023-10-11 DIAGNOSIS — C44729 Squamous cell carcinoma of skin of left lower limb, including hip: Secondary | ICD-10-CM | POA: Diagnosis not present

## 2023-10-11 DIAGNOSIS — Z85828 Personal history of other malignant neoplasm of skin: Secondary | ICD-10-CM | POA: Diagnosis not present

## 2023-10-18 DIAGNOSIS — Z85828 Personal history of other malignant neoplasm of skin: Secondary | ICD-10-CM | POA: Diagnosis not present

## 2023-10-18 DIAGNOSIS — C44311 Basal cell carcinoma of skin of nose: Secondary | ICD-10-CM | POA: Diagnosis not present

## 2023-10-26 DIAGNOSIS — M79601 Pain in right arm: Secondary | ICD-10-CM | POA: Diagnosis not present

## 2023-12-07 ENCOUNTER — Telehealth: Payer: Self-pay

## 2023-12-07 NOTE — Telephone Encounter (Signed)
 [place] [id number] Understanding and Predicting Breast Cancer Events after Treatment (UPBEAT)   Study Follow-up - Year 4   The CRC called the patient at 2:00 PM regarding the Year-4 follow-up for the above study. The patient reported doing well and denied any hospitalizations since the last follow-up call on 01/05/2023. She also denied experiencing any cardiac events, including myocardial infarction (MI), percutaneous coronary intervention (PCI), coronary artery bypass grafting (CABG), heart catheterization, cerebrovascular accident (CVA), or a diagnosis of heart failure since the last contact.   The patient completed the KCCQ-12 questionnaire online. She confirmed her email address remains unchanged. The patient was informed that the next follow-up call is expected to take place in approximately one year.    She denied having any questions at this time and was encouraged to contact the research team with any study-related questions in the future. The patient was thanked for her time and continued participation to the study.  Malikah Principato, Ph.D. Clinical Research Coordinator [phone] @today @ 2:30 PM

## 2023-12-14 ENCOUNTER — Other Ambulatory Visit: Payer: Self-pay

## 2023-12-17 ENCOUNTER — Other Ambulatory Visit: Payer: Self-pay

## 2023-12-17 DIAGNOSIS — M810 Age-related osteoporosis without current pathological fracture: Secondary | ICD-10-CM

## 2023-12-17 MED ORDER — DENOSUMAB 60 MG/ML ~~LOC~~ SOSY: 60.0000 mg | PREFILLED_SYRINGE | Freq: Once | SUBCUTANEOUS | Status: DC

## 2023-12-20 ENCOUNTER — Telehealth: Payer: Self-pay

## 2023-12-20 ENCOUNTER — Other Ambulatory Visit (HOSPITAL_COMMUNITY): Payer: Self-pay

## 2023-12-20 NOTE — Telephone Encounter (Signed)
 PA for buy and bill submitted via CMM. Key: [id number]  Pharmacy benefit: [phone]

## 2023-12-20 NOTE — Telephone Encounter (Signed)
 Catherine Long

## 2023-12-21 ENCOUNTER — Other Ambulatory Visit (HOSPITAL_COMMUNITY): Payer: Self-pay

## 2023-12-21 NOTE — Telephone Encounter (Signed)
 Pharmacy Patient Advocate Encounter  Received notification from [employer] that Prior Authorization for Prolia  60MG /ML syringes has been APPROVED from 12/20/23 to 08/02/24   PA #/Case ID/Reference #: [id number]

## 2023-12-21 NOTE — Telephone Encounter (Signed)
 Pt ready for scheduling for PROLIA  on or after : 01/06/24  Option# 1: Buy/Bill (Office supplied medication)  Out-of-pocket cost due at time of clinic visit: $40  Number of injection/visits approved: 2  Primary: [employer] Prolia  co-insurance: $40 Admin fee co-insurance: 0%  Secondary: --- Prolia  co-insurance:  Admin fee co-insurance:   Medical Benefit Details: Date Benefits were checked: 12/12/23 Deductible: NO/ Coinsurance: $40/ Admin Fee: 0%  Prior Auth: APPROVED PA# [id number]  Expiration Date: 12/20/23-08/02/24  # of doses approved: 2 ----------------------------------------------------------------------- Option# 2- Med Obtained from pharmacy:  Pharmacy benefit: Copay $64 (Paid to pharmacy) Admin Fee: 0% (Pay at clinic)  Prior Auth: N/A PA# Expiration Date:   # of doses approved:   If patient wants fill through the pharmacy benefit please send prescription to: [employer], and include estimated need by date in rx notes. Pharmacy will ship medication directly to the office.  Patient NOT eligible for Prolia  Copay Card. Copay Card can make patient's cost as little as $25. Link to apply: [email or web address]://www.[email or web address]  ** This summary of benefits is an estimation of the patient's out-of-pocket cost. Exact cost may very based on individual plan coverage.

## 2023-12-22 ENCOUNTER — Other Ambulatory Visit (HOSPITAL_COMMUNITY): Payer: Self-pay

## 2023-12-28 ENCOUNTER — Other Ambulatory Visit: Payer: Self-pay

## 2023-12-28 ENCOUNTER — Telehealth: Payer: Self-pay

## 2023-12-28 DIAGNOSIS — M810 Age-related osteoporosis without current pathological fracture: Secondary | ICD-10-CM

## 2023-12-28 MED ORDER — DENOSUMAB 60 MG/ML ~~LOC~~ SOSY: 60.0000 mg | PREFILLED_SYRINGE | Freq: Once | SUBCUTANEOUS | Status: DC

## 2023-12-28 NOTE — Telephone Encounter (Signed)
 Copied from CRM [id number]. Topic: Clinical - Prescription Issue >> Dec 28, 2023 11:12 AM Rosaria Common wrote: Reason for CRM: Pt has an appt set. Calling to remind provider about medication Prolia  60 MG needed. Callback number is [phone].

## 2023-12-29 ENCOUNTER — Other Ambulatory Visit: Payer: Self-pay

## 2023-12-30 ENCOUNTER — Other Ambulatory Visit: Payer: Self-pay

## 2023-12-30 NOTE — Progress Notes (Signed)
 Disenrolling - 5.19.25 chart encounter shows buy-bill for patient prolia .

## 2024-01-07 ENCOUNTER — Other Ambulatory Visit: Payer: Self-pay

## 2024-01-07 ENCOUNTER — Telehealth: Payer: Self-pay

## 2024-01-07 DIAGNOSIS — M810 Age-related osteoporosis without current pathological fracture: Secondary | ICD-10-CM

## 2024-01-07 MED ORDER — DENOSUMAB 60 MG/ML ~~LOC~~ SOSY
60.0000 mg | PREFILLED_SYRINGE | Freq: Once | SUBCUTANEOUS | Status: AC
  Administered 2024-01-27: 60 mg via SUBCUTANEOUS

## 2024-01-07 NOTE — Telephone Encounter (Signed)
 Copied from CRM [id number]. Topic: Clinical - Prescription Issue >> Dec 28, 2023 11:12 AM Rosaria Common wrote: Reason for CRM: Pt has an appt set. Calling to remind provider about medication Prolia  60 MG needed. Callback number is [phone]. >> Jan 07, 2024  9:55 AM Emylou G wrote: Patient called.. wanted to make sure prolia  shot was rcvd before going to appt.. pls call her to confirm

## 2024-01-10 ENCOUNTER — Other Ambulatory Visit: Payer: Self-pay

## 2024-01-11 ENCOUNTER — Ambulatory Visit: Payer: Medicare PPO

## 2024-01-18 ENCOUNTER — Telehealth: Payer: Self-pay | Admitting: Family Medicine

## 2024-01-18 NOTE — Telephone Encounter (Signed)
 Copied from CRM [id number]. Topic: Appointments - Scheduling Inquiry for Clinic >> Jan 17, 2024  4:24 PM Martinique E wrote: Reason for CRM: Patient called wanting to schedule her Prolia  injection. Patient is leaving out of town on June 27th and hoping to have it done before then. Please call patient to schedule, thank you.

## 2024-01-25 ENCOUNTER — Other Ambulatory Visit (HOSPITAL_COMMUNITY): Payer: Self-pay

## 2024-01-25 ENCOUNTER — Other Ambulatory Visit: Payer: Self-pay

## 2024-01-25 NOTE — Telephone Encounter (Signed)
 Patient called in inquiring about the prolia  injection. I have sent an email to [facility] to have a prolia  sent to our office. We will call patient once it has arrived.

## 2024-01-26 NOTE — Telephone Encounter (Signed)
 Pt scheduled for tomorrow 01/27/24.

## 2024-01-27 ENCOUNTER — Ambulatory Visit (INDEPENDENT_AMBULATORY_CARE_PROVIDER_SITE_OTHER)

## 2024-01-27 DIAGNOSIS — M810 Age-related osteoporosis without current pathological fracture: Secondary | ICD-10-CM | POA: Diagnosis not present

## 2024-01-27 MED ORDER — DENOSUMAB 60 MG/ML ~~LOC~~ SOSY: 60.0000 mg | PREFILLED_SYRINGE | SUBCUTANEOUS | Status: AC

## 2024-01-27 NOTE — Progress Notes (Signed)
 Patient is in office today for a nurse visit for Prolia  injection. Patient Injection was given in the  Left arm. Patient tolerated injection well. Pt is to return in 6 months for follow up injection.   Elida GORMAN Manila, CMA

## 2024-02-10 NOTE — Progress Notes (Signed)
 Open to sign encouter

## 2024-03-23 ENCOUNTER — Ambulatory Visit: Admitting: Family Medicine

## 2024-03-23 ENCOUNTER — Encounter: Payer: Self-pay | Admitting: Family Medicine

## 2024-03-23 VITALS — BP 120/76 | HR 75 | Temp 97.8°F | Ht 63.0 in | Wt 151.8 lb

## 2024-03-23 DIAGNOSIS — H6991 Unspecified Eustachian tube disorder, right ear: Secondary | ICD-10-CM

## 2024-03-23 MED ORDER — CETIRIZINE HCL 10 MG PO TABS: 10.0000 mg | ORAL_TABLET | Freq: Every day | ORAL | 11 refills | Status: DC

## 2024-03-23 MED ORDER — FLUTICASONE PROPIONATE 50 MCG/ACT NA SUSP: 2.0000 | Freq: Every day | NASAL | 6 refills | Status: DC

## 2024-03-23 NOTE — Progress Notes (Signed)
 Subjective:    Patient ID: Catherine Long, female    DOB: 05-27-1951, 73 y.o.   MRN: [id number]  Ear Fullness    Patient reports fullness in her right ear ever since June.  She feels like her ear is stopped up.  This began after she had Mohs surgery on the right cheek.  This did cause a slight septal deviation to the right partially obstructing her right nostril due to the tissue that they had to remove.  On exam today there is no cerumen impaction.  The eardrum appears normal.  She does have a slight septal deviation.  She has no tenderness or pain in her right maxillary or right frontal sinus.   Past Medical History:  Diagnosis Date   Asthma    several years ago   Cancer [facility])    Phreesia 05/11/2020   Family history of breast cancer    Family history of melanoma    Family history of ovarian cancer    History of melanoma    Melanoma (HCC)    PONV (postoperative nausea and vomiting)    Right middle lobe syndrome    bronchiectasis with possible MAC (seen on CXR)    Past Surgical History:  Procedure Laterality Date   BREAST SURGERY N/A    Phreesia 05/11/2020   JOINT REPLACEMENT N/A    Phreesia 05/11/2020   RADIOACTIVE SEED GUIDED PARTIAL MASTECTOMY WITH AXILLARY SENTINEL LYMPH NODE BIOPSY Right 09/27/2019   Procedure: RIGHT RADIOACTIVE SEED GUIDED LUMPECTOMY WITH  SENTINEL LYMPH NODE BIOPSY;  Surgeon: Vernetta Berg, MD;  Location: [facility] OR;  Service: General;  Laterality: Right;  LMA   TONSILLECTOMY     TOTAL HIP ARTHROPLASTY  2003 and 2006   Bilateral   Current Outpatient Medications on File Prior to Visit  Medication Sig Dispense Refill   anastrozole  (ARIMIDEX ) 1 MG tablet TAKE 1 TABLET EVERY DAY 90 tablet 3   ascorbic acid (VITAMIN C) 1000 MG tablet Take 1,000 mg by mouth daily.     B Complex-C (SUPER B COMPLEX) TABS Take 1 tablet by mouth daily.     budesonide -formoterol  (SYMBICORT ) 80-4.5 MCG/ACT inhaler Take 2 puffs first thing in am and then another 2 puffs about  12 hours later. (Patient taking differently: Inhale 2 puffs into the lungs. Take 2 puffs first thing in am and then another 2 puffs about 12 hours later.) 1 each 12   Calcium Carbonate-Vitamin D3 (CALCIUM 600/VITAMIN D) 600-400 MG-UNIT TABS Take 2 tablets by mouth daily.     Multiple Vitamins-Minerals (MULTI COMPLETE/IRON PO) Take 1 tablet by mouth daily.     Current Facility-Administered Medications on File Prior to Visit  Medication Dose Route Frequency Provider Last Rate Last Admin   denosumab  (PROLIA ) injection 60 mg  60 mg Subcutaneous Q6 months [place] Bor[place]t, Butler DASEN, MD       Allergies  Allergen Reactions   Latex Other (See Comments), Rash and Dermatitis   Social History   Socioeconomic History   Marital status: Widowed    Spouse name: Not on file   Number of children: 2   Years of education: Not on file   Highest education level: Not on file  Occupational History   Occupation: Retired  Tobacco Use   Smoking status: Never   Smokeless tobacco: Never  Vaping Use   Vaping status: Never Used  Substance and Sexual Activity   Alcohol use: Yes    Alcohol/week: 3.0 - 4.0 standard drinks of alcohol    Types:  3 - 4 Glasses of wine per week    Comment: Socially   Drug use: No   Sexual activity: Not on file  Other Topics Concern   Not on file  Social History Narrative   Not on file   Social Drivers of Health   Financial Resource Strain: Not on file  Food Insecurity: Not on file  Transportation Needs: Not on file  Physical Activity: Not on file  Stress: Not on file  Social Connections: Not on file  Intimate Partner Violence: Not on file   Family History  Problem Relation Age of Onset   Heart disease Father    Melanoma Father        dx. 60s   Breast cancer Mother        bilateral, dx. mid-50s   Melanoma Mother        dx. 39s   Melanoma Sister        dx. 40s   Breast cancer Maternal Aunt        dx. >50   Ovarian cancer Maternal Aunt        dx. >50      Review of  Systems  All other systems reviewed and are negative.      Objective:   Physical Exam Vitals reviewed.  Constitutional:      General: She is not in acute distress.    Appearance: She is well-developed. She is not diaphoretic.  HENT:     Head: Normocephalic and atraumatic.     Right Ear: Tympanic membrane, ear canal and external ear normal.     Left Ear: Tympanic membrane, ear canal and external ear normal.     Nose: Septal deviation present.     Mouth/Throat:     Pharynx: No oropharyngeal exudate.  Eyes:     General: No scleral icterus.       Right eye: No discharge.        Left eye: No discharge.     Conjunctiva/sclera: Conjunctivae normal.     Pupils: Pupils are equal, round, and reactive to light.  Neck:     Thyroid: No thyromegaly.     Vascular: No JVD.     Trachea: No tracheal deviation.  Cardiovascular:     Rate and Rhythm: Normal rate and regular rhythm.     Heart sounds: Normal heart sounds. No murmur heard.    No friction rub. No gallop.  Pulmonary:     Effort: Pulmonary effort is normal. No respiratory distress.     Breath sounds: Normal breath sounds. No stridor. No wheezing or rales.  Chest:     Chest wall: No tenderness.  Musculoskeletal:     Cervical back: Neck supple.  Lymphadenopathy:     Cervical: No cervical adenopathy.  Skin:    General: Skin is warm.     Coloration: Skin is not pale.     Findings: No erythema or rash.  Neurological:     Mental Status: She is alert and oriented to person, place, and time.     Cranial Nerves: No cranial nerve deficit.     Motor: No abnormal muscle tone.     Coordination: Coordination normal.     Deep Tendon Reflexes: Reflexes are normal and symmetric.  Psychiatric:        Behavior: Behavior normal.        Thought Content: Thought content normal.        Judgment: Judgment normal.  Assessment & Plan:  Dysfunction of right eustachian tube  I believe the patient is dealing with eustachian tube  dysfunction secondary to septal deviation.  Recommended trying Flonase  2 sprays each nostril daily coupled with Zyrtec  10 mg daily.  If not improving will consult ENT

## 2024-04-24 DIAGNOSIS — L821 Other seborrheic keratosis: Secondary | ICD-10-CM | POA: Diagnosis not present

## 2024-04-24 DIAGNOSIS — L578 Other skin changes due to chronic exposure to nonionizing radiation: Secondary | ICD-10-CM | POA: Diagnosis not present

## 2024-04-24 DIAGNOSIS — L814 Other melanin hyperpigmentation: Secondary | ICD-10-CM | POA: Diagnosis not present

## 2024-04-24 DIAGNOSIS — Z85828 Personal history of other malignant neoplasm of skin: Secondary | ICD-10-CM | POA: Diagnosis not present

## 2024-04-24 DIAGNOSIS — D225 Melanocytic nevi of trunk: Secondary | ICD-10-CM | POA: Diagnosis not present

## 2024-04-24 DIAGNOSIS — L570 Actinic keratosis: Secondary | ICD-10-CM | POA: Diagnosis not present

## 2024-05-24 ENCOUNTER — Other Ambulatory Visit: Payer: Medicare PPO

## 2024-05-24 ENCOUNTER — Ambulatory Visit

## 2024-05-24 DIAGNOSIS — M810 Age-related osteoporosis without current pathological fracture: Secondary | ICD-10-CM

## 2024-05-24 DIAGNOSIS — Z0000 Encounter for general adult medical examination without abnormal findings: Secondary | ICD-10-CM

## 2024-05-24 LAB — CBC WITH DIFFERENTIAL/PLATELET
Absolute Lymphocytes: 1989 {cells}/uL (ref 850–3900)
Absolute Monocytes: 510 {cells}/uL (ref 200–950)
Basophils Absolute: 79 {cells}/uL (ref 0–200)
Basophils Relative: 0.9 %
Eosinophils Absolute: 114 {cells}/uL (ref 15–500)
Eosinophils Relative: 1.3 %
HCT: 42 % (ref 35.0–45.0)
Hemoglobin: 13.5 g/dL (ref 11.7–15.5)
MCH: 27.3 pg (ref 27.0–33.0)
MCHC: 32.1 g/dL (ref 32.0–36.0)
MCV: 84.8 fL (ref 80.0–100.0)
MPV: 9.1 fL (ref 7.5–12.5)
Monocytes Relative: 5.8 %
Neutro Abs: 6107 {cells}/uL (ref 1500–7800)
Neutrophils Relative %: 69.4 %
Platelets: 363 Thousand/uL (ref 140–400)
RBC: 4.95 Million/uL (ref 3.80–5.10)
RDW: 16.7 % — ABNORMAL HIGH (ref 11.0–15.0)
Total Lymphocyte: 22.6 %
WBC: 8.8 Thousand/uL (ref 3.8–10.8)

## 2024-05-24 LAB — COMPREHENSIVE METABOLIC PANEL WITH GFR
AG Ratio: 1.6 (calc) (ref 1.0–2.5)
ALT: 12 U/L (ref 6–29)
AST: 16 U/L (ref 10–35)
Albumin: 4.3 g/dL (ref 3.6–5.1)
Alkaline phosphatase (APISO): 59 U/L (ref 37–153)
BUN: 12 mg/dL (ref 7–25)
CO2: 27 mmol/L (ref 20–32)
Calcium: 9.7 mg/dL (ref 8.6–10.4)
Chloride: 102 mmol/L (ref 98–110)
Creat: 0.73 mg/dL (ref 0.60–1.00)
Globulin: 2.7 g/dL (ref 1.9–3.7)
Glucose, Bld: 83 mg/dL (ref 65–99)
Potassium: 4.7 mmol/L (ref 3.5–5.3)
Sodium: 139 mmol/L (ref 135–146)
Total Bilirubin: 0.4 mg/dL (ref 0.2–1.2)
Total Protein: 7 g/dL (ref 6.1–8.1)
eGFR: 87 mL/min/1.73m2 (ref >=60)

## 2024-05-24 LAB — LIPID PANEL
Cholesterol: 146 mg/dL (ref <200)
HDL: 68 mg/dL (ref >=50)
LDL Cholesterol (Calc): 59 mg/dL
Non-HDL Cholesterol (Calc): 78 mg/dL (ref <130)
Total CHOL/HDL Ratio: 2.1 (calc) (ref <5.0)
Triglycerides: 111 mg/dL (ref <150)

## 2024-05-24 LAB — VITAMIN D 25 HYDROXY (VIT D DEFICIENCY, FRACTURES): Vit D, 25-Hydroxy: 54 ng/mL (ref 30–100)

## 2024-05-25 ENCOUNTER — Ambulatory Visit: Payer: Self-pay | Admitting: Family Medicine

## 2024-05-30 ENCOUNTER — Ambulatory Visit: Payer: Medicare PPO | Admitting: Family Medicine

## 2024-05-30 ENCOUNTER — Encounter: Payer: Self-pay | Admitting: Family Medicine

## 2024-05-30 VITALS — BP 128/70 | HR 72 | Temp 97.6°F | Ht 63.0 in | Wt 152.2 lb

## 2024-05-30 DIAGNOSIS — M8580 Other specified disorders of bone density and structure, unspecified site: Secondary | ICD-10-CM | POA: Diagnosis not present

## 2024-05-30 DIAGNOSIS — Z0000 Encounter for general adult medical examination without abnormal findings: Secondary | ICD-10-CM

## 2024-05-30 DIAGNOSIS — Z23 Encounter for immunization: Secondary | ICD-10-CM | POA: Diagnosis not present

## 2024-05-30 DIAGNOSIS — Z0001 Encounter for general adult medical examination with abnormal findings: Secondary | ICD-10-CM | POA: Diagnosis not present

## 2024-05-30 NOTE — Addendum Note (Signed)
 Addended by: ANGELENA RONAL BRADLEY K on: 05/30/2024 09:50 AM   Modules accepted: Orders

## 2024-05-30 NOTE — Progress Notes (Signed)
 Subjective:    Patient ID: Catherine Long, female    DOB: 1951/07/22, 73 y.o.   MRN: [id number]  HPI Patient is a very pleasant 73 year old Caucasian female here today for complete physical exam.  Her mammogram was performed earlier this year and was normal.  Her last colonoscopy was in 2016 and is due again next year.  She does not require Pap smear.  She has a history of osteoporosis.  Bone density test in 2023 her T-score was -2.7 in her spine.  This year was -1.2 in her spine.  In 2021 it was -1.5 in her spine.  Therefore I feel that the T-score in 2023 was an outlier.  Since switching from Fosamax  to Prolia , the patient had horrible side effects.  She does not want to do the Prolia  again.  Therefore we decided not to treat and simply to monitor again in 2 years.  She will continue calcium and vitamin D.  Otherwise she is doing well.  She is due for a flu shot, RSV, and COVID Immunization History  Administered Date(s) Administered   Fluad Quad(high Dose 65+) 06/20/2019, 05/14/2020, 05/16/2021, 05/25/2022   Fluad Trivalent(High Dose 65+) 05/27/2023   INFLUENZA, HIGH DOSE SEASONAL PF 05/20/2017, 05/23/2018   Influenza Split 04/26/2012   Influenza,inj,Quad PF,6+ Mos 06/01/2013, 05/28/2014, 05/15/2016   Influenza-Unspecified 05/17/2020, 05/14/2021, 05/17/2022   PFIZER(Purple Top)SARS-COV-2 Vaccination 09/10/2019, 10/05/2019   Pfizer Covid-19 Vaccine Bivalent Booster 78yrs & up 05/04/2022   Pneumococcal Conjugate-13 05/15/2016   Pneumococcal Polysaccharide-23 04/26/2012, 05/23/2018   Pneumococcal-Unspecified 05/24/2019   Unspecified SARS-COV-2 Vaccination 09/05/2019, 10/05/2019, 05/03/2020, 11/06/2020   Zoster Recombinant(Shingrix) 10/05/2017, 12/26/2017   Zoster, Live 05/30/2014, 09/03/2017, 09/27/2017, 05/22/2018   Mammogram 2/24- normal DEXA 2/23- osteoporosis with T score -2.7 Colonoscopy 2016 -2 polyps  Her most recent lab work as listed below: Lab on 05/24/2024  Component Date  Value Ref Range Status   WBC 05/24/2024 8.8  3.8 - 10.8 Thousand/uL Final   RBC 05/24/2024 4.95  3.80 - 5.10 Million/uL Final   Hemoglobin 05/24/2024 13.5  11.7 - 15.5 g/dL Final   HCT 89/77/7974 42.0  35.0 - 45.0 % Final   MCV 05/24/2024 84.8  80.0 - 100.0 fL Final   MCH 05/24/2024 27.3  27.0 - 33.0 pg Final   MCHC 05/24/2024 32.1  32.0 - 36.0 g/dL Final   Comment: For adults, a slight decrease in the calculated MCHC value (in the range of 30 to 32 g/dL) is most likely not clinically significant; however, it should be interpreted with caution in correlation with other red cell parameters and the patient's clinical condition.    RDW 05/24/2024 16.7 (H)  11.0 - 15.0 % Final   Platelets 05/24/2024 363  140 - 400 Thousand/uL Final   MPV 05/24/2024 9.1  7.5 - 12.5 fL Final   Neutro Abs 05/24/2024 6,107  1,500 - 7,800 cells/uL Final   Absolute Lymphocytes 05/24/2024 1,989  850 - 3,900 cells/uL Final   Absolute Monocytes 05/24/2024 510  200 - 950 cells/uL Final   Eosinophils Absolute 05/24/2024 114  15 - 500 cells/uL Final   Basophils Absolute 05/24/2024 79  0 - 200 cells/uL Final   Neutrophils Relative % 05/24/2024 69.4  % Final   Total Lymphocyte 05/24/2024 22.6  % Final   Monocytes Relative 05/24/2024 5.8  % Final   Eosinophils Relative 05/24/2024 1.3  % Final   Basophils Relative 05/24/2024 0.9  % Final   Glucose, Bld 05/24/2024 83  65 - 99 mg/dL Final  Comment: .            Fasting reference interval .    BUN 05/24/2024 12  7 - 25 mg/dL Final   Creat 89/77/7974 0.73  0.60 - 1.00 mg/dL Final   eGFR 89/77/7974 87  > OR = 60 mL/min/1.50m2 Final   BUN/Creatinine Ratio 05/24/2024 SEE NOTE:  6 - 22 (calc) Final   Comment:    Not Reported: BUN and Creatinine are within    reference range. .    Sodium 05/24/2024 139  135 - 146 mmol/L Final   Potassium 05/24/2024 4.7  3.5 - 5.3 mmol/L Final   Chloride 05/24/2024 102  98 - 110 mmol/L Final   CO2 05/24/2024 27  20 - 32 mmol/L Final    Calcium 05/24/2024 9.7  8.6 - 10.4 mg/dL Final   Total Protein 89/77/7974 7.0  6.1 - 8.1 g/dL Final   Albumin 89/77/7974 4.3  3.6 - 5.1 g/dL Final   Globulin 89/77/7974 2.7  1.9 - 3.7 g/dL (calc) Final   AG Ratio 05/24/2024 1.6  1.0 - 2.5 (calc) Final   Total Bilirubin 05/24/2024 0.4  0.2 - 1.2 mg/dL Final   Alkaline phosphatase (APISO) 05/24/2024 59  37 - 153 U/L Final   AST 05/24/2024 16  10 - 35 U/L Final   ALT 05/24/2024 12  6 - 29 U/L Final   Cholesterol 05/24/2024 146  <200 mg/dL Final   HDL 89/77/7974 68  > OR = 50 mg/dL Final   Triglycerides 89/77/7974 111  <150 mg/dL Final   LDL Cholesterol (Calc) 05/24/2024 59  mg/dL (calc) Final   Comment: Reference range: <100 . Desirable range <100 mg/dL for primary prevention;   <70 mg/dL for patients with CHD or diabetic patients  with > or = 2 CHD risk factors. SABRA LDL-C is now calculated using the Martin-Hopkins  calculation, which is a validated novel method providing  better accuracy than the Friedewald equation in the  estimation of LDL-C.  Gladis APPLETHWAITE et al. SANDREA. 7986;689(80): 2061-2068  (http://education.[facility].com/faq/FAQ164)    Total CHOL/HDL Ratio 05/24/2024 2.1  <4.9 (calc) Final   Non-HDL Cholesterol (Calc) 05/24/2024 78  <130 mg/dL (calc) Final   Comment: For patients with diabetes plus 1 major ASCVD risk  factor, treating to a non-HDL-C goal of <100 mg/dL  (LDL-C of <29 mg/dL) is considered a therapeutic  option.    Vit D, 25-Hydroxy 05/24/2024 54  30 - 100 ng/mL Final   Comment: Vitamin D Status         25-OH Vitamin D: . Deficiency:                    <20 ng/mL Insufficiency:             20 - 29 ng/mL Optimal:                 > or = 30 ng/mL . For 25-OH Vitamin D testing on patients on  D2-supplementation and patients for whom quantitation  of D2 and D3 fractions is required, the [facility]AssureD(TM) 25-OH VIT D, (D2,D3), LC/MS/MS is recommended: order  code 07111 (patients >21yrs). . See Note  1 . Note 1 . For additional information, please refer to  http://education.[facility].com/faq/FAQ199  (This link is being provided for informational/ educational purposes only.)     Past Medical History:  Diagnosis Date   Asthma    several years ago   Cancer [facility])    Phreesia 05/11/2020   Family history of  breast cancer    Family history of melanoma    Family history of ovarian cancer    History of melanoma    Melanoma (HCC)    PONV (postoperative nausea and vomiting)    Right middle lobe syndrome    bronchiectasis with possible MAC (seen on CXR)    Past Surgical History:  Procedure Laterality Date   BREAST SURGERY N/A    Phreesia 05/11/2020   JOINT REPLACEMENT N/A    Phreesia 05/11/2020   RADIOACTIVE SEED GUIDED PARTIAL MASTECTOMY WITH AXILLARY SENTINEL LYMPH NODE BIOPSY Right 09/27/2019   Procedure: RIGHT RADIOACTIVE SEED GUIDED LUMPECTOMY WITH  SENTINEL LYMPH NODE BIOPSY;  Surgeon: Vernetta Berg, MD;  Location: [facility] OR;  Service: General;  Laterality: Right;  LMA   TONSILLECTOMY     TOTAL HIP ARTHROPLASTY  2003 and 2006   Bilateral   Current Outpatient Medications on File Prior to Visit  Medication Sig Dispense Refill   anastrozole  (ARIMIDEX ) 1 MG tablet TAKE 1 TABLET EVERY DAY 90 tablet 3   ascorbic acid (VITAMIN C) 1000 MG tablet Take 1,000 mg by mouth daily. (Patient taking differently: Take 500 mg by mouth daily.)     B Complex-C (SUPER B COMPLEX) TABS Take 1 tablet by mouth daily.     budesonide -formoterol  (SYMBICORT ) 80-4.5 MCG/ACT inhaler Take 2 puffs first thing in am and then another 2 puffs about 12 hours later. (Patient taking differently: Inhale 2 puffs into the lungs. Take 2 puffs first thing in am and then another 2 puffs about 12 hours later.) 1 each 12   Calcium Carbonate-Vitamin D3 (CALCIUM 600/VITAMIN D) 600-400 MG-UNIT TABS Take 2 tablets by mouth daily.     Multiple Vitamins-Minerals (MULTI COMPLETE/IRON PO) Take 1 tablet by mouth daily.      Current Facility-Administered Medications on File Prior to Visit  Medication Dose Route Frequency Provider Last Rate Last Admin   denosumab  (PROLIA ) injection 60 mg  60 mg Subcutaneous Q6 months [place] Montecalvo, Butler DASEN, MD       Allergies  Allergen Reactions   Latex Other (See Comments), Rash and Dermatitis   Social History   Socioeconomic History   Marital status: Widowed    Spouse name: Not on file   Number of children: 2   Years of education: Not on file   Highest education level: Associate degree: occupational, scientist, product/process development, or vocational program  Occupational History   Occupation: Retired  Tobacco Use   Smoking status: Never   Smokeless tobacco: Never  Vaping Use   Vaping status: Never Used  Substance and Sexual Activity   Alcohol use: Yes    Alcohol/week: 3.0 - 4.0 standard drinks of alcohol    Types: 3 - 4 Glasses of wine per week    Comment: Socially   Drug use: No   Sexual activity: Not on file  Other Topics Concern   Not on file  Social History Narrative   Not on file   Social Drivers of Health   Financial Resource Strain: Low Risk  (05/26/2024)   Overall Financial Resource Strain (CARDIA)    Difficulty of Paying Living Expenses: Not very hard  Food Insecurity: No Food Insecurity (05/26/2024)   Hunger Vital Sign    Worried About Running Out of Food in the Last Year: Never true    Ran Out of Food in the Last Year: Never true  Transportation Needs: No Transportation Needs (05/26/2024)   PRAPARE - Administrator, Civil Service (Medical): No  Lack of Transportation (Non-Medical): No  Physical Activity: Insufficiently Active (05/26/2024)   Exercise Vital Sign    Days of Exercise per Week: 3 days    Minutes of Exercise per Session: 20 min  Stress: No Stress Concern Present (05/26/2024)   [employer] of Occupational Health - Occupational Stress Questionnaire    Feeling of Stress: Only a little  Social Connections: Moderately Isolated  (05/26/2024)   Social Connection and Isolation Panel    Frequency of Communication with Friends and Family: More than three times a week    Frequency of Social Gatherings with Friends and Family: Once a week    Attends Religious Services: More than 4 times per year    Active Member of [employer] or Organizations: No    Attends Banker Meetings: Not on file    Marital Status: Widowed  Intimate Partner Violence: Not on file   Family History  Problem Relation Age of Onset   Heart disease Father    Melanoma Father        dx. 60s   Breast cancer Mother        bilateral, dx. mid-50s   Melanoma Mother        dx. 38s   Melanoma Sister        dx. 40s   Breast cancer Maternal Aunt        dx. >50   Ovarian cancer Maternal Aunt        dx. >50      Review of Systems  All other systems reviewed and are negative.      Objective:   Physical Exam Vitals reviewed.  Constitutional:      General: She is not in acute distress.    Appearance: She is well-developed. She is not diaphoretic.  HENT:     Head: Normocephalic and atraumatic.     Right Ear: External ear normal.     Left Ear: External ear normal.     Nose: Nose normal.     Mouth/Throat:     Pharynx: No oropharyngeal exudate.  Eyes:     General: No scleral icterus.       Right eye: No discharge.        Left eye: No discharge.     Conjunctiva/sclera: Conjunctivae normal.     Pupils: Pupils are equal, round, and reactive to light.  Neck:     Thyroid: No thyromegaly.     Vascular: No JVD.     Trachea: No tracheal deviation.  Cardiovascular:     Rate and Rhythm: Normal rate and regular rhythm.     Heart sounds: Normal heart sounds. No murmur heard.    No friction rub. No gallop.  Pulmonary:     Effort: Pulmonary effort is normal. No respiratory distress.     Breath sounds: Normal breath sounds. No stridor. No wheezing or rales.  Chest:     Chest wall: No tenderness.  Abdominal:     General: Bowel sounds are  normal. There is no distension.     Palpations: Abdomen is soft. There is no mass.     Tenderness: There is no abdominal tenderness. There is no guarding or rebound.  Musculoskeletal:        General: No tenderness. Normal range of motion.     Cervical back: Normal range of motion and neck supple.  Lymphadenopathy:     Cervical: No cervical adenopathy.  Skin:    General: Skin is warm.     Coloration: Skin  is not pale.     Findings: No erythema or rash.  Neurological:     Mental Status: She is alert and oriented to person, place, and time.     Cranial Nerves: No cranial nerve deficit.     Motor: No abnormal muscle tone.     Coordination: Coordination normal.     Deep Tendon Reflexes: Reflexes are normal and symmetric.  Psychiatric:        Behavior: Behavior normal.        Thought Content: Thought content normal.        Judgment: Judgment normal.           Assessment & Plan:  Routine general medical examination at a health care facility  Osteopenia, unspecified location Her most recent bone density test showed osteopenia.  Given the side effects she had with the Prolia  and the discrepancies between her bone densities we have decided not to treat and we will recheck a bone density test again in 2027.  Mammogram is due in 2026.  Colonoscopy is due in 2026.  Lab work is outstanding.  Patient received her flu shot.  I recommended COVID and RSV.  Patient will continue calcium 1200 mg a day and vitamin D 1000 units a day

## 2024-06-21 ENCOUNTER — Telehealth: Payer: Self-pay

## 2024-06-21 NOTE — Telephone Encounter (Signed)
 Reminder received to check benefits for pt's Prolia . Pt has decided to discontinue Prolia  due to side effects as noted in OV on 05/30/2024. Note faxed to [employer] support to advise. Mjp,lpn

## 2024-07-12 DIAGNOSIS — Z13220 Encounter for screening for lipoid disorders: Secondary | ICD-10-CM | POA: Insufficient documentation

## 2024-07-18 ENCOUNTER — Ambulatory Visit: Admitting: Family Medicine

## 2024-07-18 ENCOUNTER — Ambulatory Visit: Payer: Self-pay

## 2024-07-18 ENCOUNTER — Encounter: Payer: Self-pay | Admitting: Family Medicine

## 2024-07-18 ENCOUNTER — Telehealth: Payer: Self-pay | Admitting: Family Medicine

## 2024-07-18 VITALS — BP 130/82 | HR 111 | Temp 98.9°F | Ht 63.0 in | Wt 148.2 lb

## 2024-07-18 DIAGNOSIS — J069 Acute upper respiratory infection, unspecified: Secondary | ICD-10-CM | POA: Insufficient documentation

## 2024-07-18 DIAGNOSIS — J4520 Mild intermittent asthma, uncomplicated: Secondary | ICD-10-CM

## 2024-07-18 MED ORDER — ALBUTEROL SULFATE HFA 108 (90 BASE) MCG/ACT IN AERS: 2.0000 | INHALATION_SPRAY | Freq: Four times a day (QID) | RESPIRATORY_TRACT | 0 refills | Status: AC | PRN

## 2024-07-18 NOTE — Telephone Encounter (Signed)
 Copied from CRM #[id number]. Topic: Clinical - Medical Advice >> Jul 18, 2024  8:50 AM Jasmin G wrote: Reason for CRM: Pt states that she has been experiencing a cough accompanied by tiredness and is requesting for a z pack to treat. Call pt back if needed at [phone].

## 2024-07-18 NOTE — Telephone Encounter (Signed)
 FYI Only or Action Required?: FYI only for provider: appointment scheduled on today.  Patient was last seen in primary care on 05/30/2024 by Duanne Butler DASEN, MD.  Called Nurse Triage reporting Cough.  Symptoms began several weeks ago.  Interventions attempted: Rest, hydration, or home remedies.  Symptoms are: gradually worsening.  Triage Disposition: See HCP Within 4 Hours (Or PCP Triage)  Patient/caregiver understands and will follow disposition?: Yes, will follow disposition  Copied from CRM #[id number]. Topic: Clinical - Red Word Triage >> Jul 18, 2024 10:13 AM Harlene ORN wrote: Red Word that prompted transfer to Nurse Triage: something going on with her chest: congestion; a little trouble breathing, but has not been exerting. Would like to be seen today by her PCP Reason for Disposition  [1] MILD difficulty breathing (e.g., minimal/no SOB at rest, SOB with walking, pulse < 100) AND [2] still present when not coughing  Answer Assessment - Initial Assessment Questions 1. ONSET: When did the cough begin?      Intermittent for weeks 2. SEVERITY: How bad is the cough today?      moderate 3. SPUTUM: Describe the color of your sputum (e.g., none, dry cough; clear, white, yellow, green)     green 4. HEMOPTYSIS: Are you coughing up any blood? If Yes, ask: How much? (e.g., flecks, streaks, tablespoons, etc.)     denies 5. DIFFICULTY BREATHING: Are you having difficulty breathing? If Yes, ask: How bad is it? (e.g., mild, moderate, severe)      mild 6. FEVER: Do you have a fever? If Yes, ask: What is your temperature, how was it measured, and when did it start?     denies 7. CARDIAC HISTORY: Do you have any history of heart disease? (e.g., heart attack, congestive heart failure)      denies 8. LUNG HISTORY: Do you have any history of lung disease?  (e.g., pulmonary embolus, asthma, emphysema)     asthma 9. PE RISK FACTORS: Do you have a history of blood clots?  (or: recent major surgery, recent prolonged travel, bedridden)     denies 10. OTHER SYMPTOMS: Do you have any other symptoms? (e.g., runny nose, wheezing, chest pain)       denies 12. TRAVEL: Have you traveled out of the country in the last month? (e.g., travel history, exposures)       denies  Protocols used: Cough - Acute Productive-A-AH

## 2024-07-18 NOTE — Progress Notes (Signed)
 Acute Office Visit  Patient ID: Catherine Long, female    DOB: 1951/05/21, 73 y.o.   MRN: [id number]  PCP: Duanne Butler DASEN, MD  Chief Complaint  Patient presents with   Cough    Pt c/o cough/congestion, fatigue, stomach issues since last Thursday. Pt has been using Mucinex and Emergen-C.      Subjective:     Cough    Discussed the use of AI scribe software for clinical note transcription with the patient, who gave verbal consent to proceed.  History of Present Illness Catherine Long is a 73 year old female with asthma who presents with chest congestion and cough.  She has been experiencing symptoms since last Thursday night, with fluctuations throughout the day. Initially, she felt unwell and went to bed early, which is unusual for her. By Friday evening, she developed chest heaviness and loss of appetite. These symptoms have persisted over the past six days, with the patient describing her symptoms as 'hit and miss,' sometimes feeling better in the mornings and worse in the afternoons.  She denies fevers, chills, or body aches. She has a productive cough with chest congestion, particularly in the mornings, and notes a 'terrible taste' in her mouth. No runny nose, sinus pressure, or ear pain.  She has a history of asthma and has been using her Symbicort  inhaler once a day for the past few days, as using it twice a day affects her sleep. She has also been taking Mucinex DM and a cold and flu medication. The inhaler has not made a significant difference yet.  She lives alone and has not been around anyone who is sick, although her mother in [place]  is currently ill. She has not taken a flu test but did take a COVID test, which was negative. She mentions having had Mohs surgery last March, which affects her ability to breathe through one nostril.   Review of Systems  Respiratory:  Positive for cough.   All other systems reviewed and are negative.   Past Medical History:   Diagnosis Date   Asthma    several years ago   Cancer [facility])    [employer]reesia 05/11/2020   Family history of breast cancer    Family history of melanoma    Family history of ovarian cancer    History of melanoma    Melanoma (HCC)    PONV (postoperative nausea and vomiting)    Right middle lobe syndrome    bronchiectasis with possible MAC (seen on CXR)    Past Surgical History:  Procedure Laterality Date   BREAST SURGERY N/A    [employer]ia 05/11/2020   JOINT REPLACEMENT N/A    [employer]reesia 05/11/2020   RADIOACTIVE SEED GUIDED PARTIAL MASTECTOMY WITH AXILLARY SENTINEL LYMPH NODE BIOPSY Right 09/27/2019   Procedure: RIGHT RADIOACTIVE SEED GUIDED LUMPECTOMY WITH  SENTINEL LYMPH NODE BIOPSY;  Surgeon: Vernetta Berg, MD;  Location: [facility] OR;  Service: General;  Laterality: Right;  LMA   TONSILLECTOMY     TOTAL HIP ARTHROPLASTY  2003 and 2006   Bilateral    Medications Ordered Prior to Encounter[1]  Allergies[2]     Objective:    BP 130/82   Pulse (!) 111   Temp 98.9 F (37.2 C)   Ht 5' 3 (1.6 m)   Wt 148 lb 3.2 oz (67.2 kg)   SpO2 93%   BMI 26.25 kg/m    Physical Exam Vitals and nursing note reviewed.  Constitutional:      Appearance:  Normal appearance. She is normal weight.  HENT:     Head: Normocephalic and atraumatic.     Right Ear: Tympanic membrane, ear canal and external ear normal.     Left Ear: Tympanic membrane, ear canal and external ear normal.     Nose: Nose normal.     Right Sinus: No maxillary sinus tenderness or frontal sinus tenderness.     Left Sinus: No maxillary sinus tenderness or frontal sinus tenderness.     Mouth/Throat:     Mouth: Mucous membranes are moist.     Pharynx: Oropharynx is clear.  Eyes:     Conjunctiva/sclera: Conjunctivae normal.     Pupils: Pupils are equal, round, and reactive to light.  Cardiovascular:     Rate and Rhythm: Normal rate and regular rhythm.     Pulses: Normal pulses.     Heart sounds: Normal heart sounds.   Pulmonary:     Effort: Pulmonary effort is normal.     Breath sounds: Normal breath sounds.  Musculoskeletal:     Cervical back: No tenderness.  Lymphadenopathy:     Cervical: No cervical adenopathy.  Skin:    General: Skin is warm and dry.  Neurological:     General: No focal deficit present.     Mental Status: She is alert and oriented to person, place, and time. Mental status is at baseline.  Psychiatric:        Mood and Affect: Mood normal.        Behavior: Behavior normal.        Thought Content: Thought content normal.        Judgment: Judgment normal.       No results found for any visits on 07/18/24.     Assessment & Plan:   Problem List Items Addressed This Visit       Respiratory   Asthma, chronic, resolved   Relevant Medications   albuterol  (VENTOLIN  HFA) 108 (90 Base) MCG/ACT inhaler   Viral URI with cough - Primary    Assessment and Plan Assessment & Plan Acute upper respiratory infection Likely viral etiology with productive cough aiding secretion clearance. No signs of bacterial infection or pneumonia. - Continue Mucinex DM. - Maintain hydration. - Monitor for worsening symptoms: increased shortness of breath, wheezing, fever, chills, body aches. - Contact clinic if symptoms worsen for chest x-ray and possible antibiotics.  Asthma Exacerbation with chest tightness and shortness of breath. Symbicort  not providing significant relief. Albuterol  recommended for consistent relief without sleep interference. - Prescribed albuterol  inhaler every 4-6 hours as needed. - Monitor for increased shortness of breath, wheezing, or worsening symptoms.    Meds ordered this encounter  Medications   albuterol  (VENTOLIN  HFA) 108 (90 Base) MCG/ACT inhaler    Sig: Inhale 2 puffs into the lungs every 6 (six) hours as needed for wheezing or shortness of breath.    Dispense:  8 g    Refill:  0    Supervising Provider:   DUANNE LOWERS T [[id number]]    Return if  symptoms worsen or fail to improve.  Jeoffrey GORMAN Barrio, FNP [facility]      [1]  Current Outpatient Medications on File Prior to Visit  Medication Sig Dispense Refill   anastrozole  (ARIMIDEX ) 1 MG tablet TAKE 1 TABLET EVERY DAY 90 tablet 3   ascorbic acid (VITAMIN C) 1000 MG tablet Take 1,000 mg by mouth daily. (Patient taking differently: Take 500 mg by mouth daily.)  B Complex-C (SUPER B COMPLEX) TABS Take 1 tablet by mouth daily.     budesonide -formoterol  (SYMBICORT ) 80-4.5 MCG/ACT inhaler Take 2 puffs first thing in am and then another 2 puffs about 12 hours later. (Patient taking differently: Inhale 2 puffs into the lungs. Take 2 puffs first thing in am and then another 2 puffs about 12 hours later.) 1 each 12   Calcium Carbonate-Vitamin D3 (CALCIUM 600/VITAMIN D ) 600-400 MG-UNIT TABS Take 2 tablets by mouth daily.     Multiple Vitamins-Minerals (MULTI COMPLETE/IRON PO) Take 1 tablet by mouth daily.     Current Facility-Administered Medications on File Prior to Visit  Medication Dose Route Frequency Provider Last Rate Last Admin   denosumab  (PROLIA ) injection 60 mg  60 mg Subcutaneous Q6 months Duanne Butler DASEN, MD      [2]  Allergies Allergen Reactions   Latex Other (See Comments), Rash and Dermatitis

## 2024-07-28 ENCOUNTER — Ambulatory Visit

## 2024-07-31 ENCOUNTER — Other Ambulatory Visit: Payer: Self-pay | Admitting: Hematology and Oncology

## 2024-07-31 DIAGNOSIS — Z1231 Encounter for screening mammogram for malignant neoplasm of breast: Secondary | ICD-10-CM

## 2024-08-07 ENCOUNTER — Ambulatory Visit: Payer: Self-pay

## 2024-08-07 ENCOUNTER — Ambulatory Visit: Admitting: Family Medicine

## 2024-08-07 ENCOUNTER — Encounter: Payer: Self-pay | Admitting: Family Medicine

## 2024-08-07 VITALS — BP 138/80 | HR 97 | Temp 98.6°F | Ht 63.0 in | Wt 140.0 lb

## 2024-08-07 DIAGNOSIS — J209 Acute bronchitis, unspecified: Secondary | ICD-10-CM

## 2024-08-07 MED ORDER — PREDNISONE 10 MG (21) PO TBPK: ORAL_TABLET | ORAL | 0 refills | Status: AC

## 2024-08-07 MED ORDER — AZITHROMYCIN 250 MG PO TABS: ORAL_TABLET | ORAL | 0 refills | Status: AC

## 2024-08-07 MED ORDER — BENZONATATE 100 MG PO CAPS: 100.0000 mg | ORAL_CAPSULE | Freq: Three times a day (TID) | ORAL | 0 refills | Status: AC

## 2024-08-07 NOTE — Telephone Encounter (Signed)
 FYI Only or Action Required?: FYI only for provider: appointment scheduled on 08/07/24.  Patient was last seen in primary care on 07/18/2024 by Kayla Jeoffrey RAMAN, FNP.  Called Nurse Triage reporting Shortness of Breath.  Symptoms began several weeks ago.  Interventions attempted: OTC medications: Mucinex and Prescription medications: Symbicort  and Albuterol  Inhaler.  Symptoms are: unchanged.  Triage Disposition: See HCP Within 4 Hours (Or PCP Triage)  Patient/caregiver understands and will follow disposition?:   Reason for Disposition  [1] MILD difficulty breathing (e.g., minimal/no SOB at rest, SOB with walking, pulse < 100) AND [2] NEW-onset or WORSE than normal  Answer Assessment - Initial Assessment Questions Patient reports being seen 12/16, given an Albuterol  inhaler instead of symbicort , states that is not working so back to using Symbicort . Had a fever 1 day (07/19/24) and the next day had a rash on left side of torso, which has since subsided. Patient has taken mucinex and Symbicort  inhaler. Scheduled first available in PCP office a few hours out of dispo.   1. RESPIRATORY STATUS: Describe your breathing? (e.g., wheezing, shortness of breath, unable to speak, severe coughing)      New activities of exertion creating mild to moderate SOB, causing patient to just stop for a second to catch breath  2. ONSET: When did this breathing problem begin?      Before 12/16  3. PATTERN Does the difficult breathing come and go, or has it been constant since it started?      Comes and goes  4. SEVERITY: How bad is your breathing? (e.g., mild, moderate, severe)      Reports it has not gotten worse, or better since starting last month  5. RECURRENT SYMPTOM: Have you had difficulty breathing before? If Yes, ask: When was the last time? and What happened that time?      Denies  6. CARDIAC HISTORY: Do you have any history of heart disease? (e.g., heart attack, angina, bypass  surgery, angioplasty)      Denies  7. LUNG HISTORY: Do you have any history of lung disease?  (e.g., pulmonary embolus, asthma, emphysema)     Asthma  8. CAUSE: What do you think is causing the breathing problem?      Unsure  9. OTHER SYMPTOMS: Do you have any other symptoms? (e.g., chest pain, cough, dizziness, fever, runny nose)     Cough, sometimes productive, dark green mucous sometimes dry. Chest tightness and discomfort when coughing or physically exerting self. Fatigue, loss of appetite.   10. O2 SATURATION MONITOR:  Do you use an oxygen saturation monitor (pulse oximeter) at home? If Yes, ask: What is your reading (oxygen level) today? What is your usual oxygen saturation reading? (e.g., 95%)       Unable to assess  Protocols used: Breathing Difficulty-A-AH  Copied from CRM [id number]. Topic: Clinical - Red Word Triage >> Aug 07, 2024  8:31 AM Amy B wrote: Red Word that prompted transfer to Nurse Triage: Cough, shortness of breath, left side pain

## 2024-08-07 NOTE — Progress Notes (Signed)
 "  Patient Office Visit  Assessment & Plan:  Acute bronchitis, unspecified organism -     predniSONE ; Use as directed.  Dispense: 21 each; Refill: 0 -     Azithromycin ; Take 2 tablets on day 1, then 1 tablet daily on days 2 through 5  Dispense: 6 tablet; Refill: 0 -     Benzonatate ; Take 1 capsule (100 mg total) by mouth 3 (three) times daily.  Dispense: 30 capsule; Refill: 0   Assessment and Plan    Acute bronchitis Persistent cough with difficulty breathing and decreased appetite. Improved oxygen saturation. Differential includes bacterial infection. Discussed potential need for chest x-ray if symptoms persist. - Prescribed Z-Pak (azithromycin ). - Prescribed Tessalon  Perles for cough. - Prescribed prednisone  taper starting the day after the visit. - Advised to continue Symbicort . - Consider chest x-ray and pulmonologist referral if no improvement. patient agrees with plan.   Asthma  Mild asthma with occasional Symbicort  use. Albuterol  ineffective. - Continue Symbicort  once daily in the morning. - Monitor and adjust treatment as necessary.  History of breast cancer Breast cancer treated with Anastrozole  for almost five years. No symptoms of recurrence. Discussed treatment duration variability.      Return if symptoms worsen or fail to improve.   Subjective:    Patient ID: Catherine Long, female    DOB: [birth year]  Age: 74 y.o. MRN: [id number]  Chief Complaint  Patient presents with   Cough    Pt c/o of worsening URI sx. She was seen in December for the same issue. She states that she has been having these sx for 4 weeks.    Cough   Discussed the use of AI scribe software for clinical note transcription with the patient, who gave verbal consent to proceed.  History of Present Illness        History of Present Illness Catherine Long is a 74 year old female with asthma who presents with a persistent cough and difficulty breathing for one month  She has been  experiencing a persistent cough and difficulty breathing for the past month. Initially thought to be a viral infection, her symptoms have worsened, and she continues to cough up phlegm. No wheezing is present, but breathing has become difficult. She has a history of asthma and uses Symbicort  as needed, especially before activities like yard work. Albuterol  was tried without relief, prompting a return to Symbicort , yet no significant improvement is noted. patient has seen Dr. Darlean previously and is supposed to see him this month.   There is a significant decrease in appetite, leading to an eight-pound weight loss since her last visit. She feels fatigued and lacks energy, which is unusual for her. Despite excessive sleep, she remains tired. A fever of 102F occurred for half a day shortly after her last visit, resolving with Advil. A transient rash appeared on her skin but has since disappeared.  Her oxygen levels were previously measured at 93%. She denies any history of pneumonia and has never been prone to it. She received her flu shot this season and is up to date on pneumonia vaccines. 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  She continues to take anastrozole  for breast cancer prevention, which she has been on for almost five years. Her mother, who is 18, had a double mastectomy in her sixties. She has no known allergies to antibiotics and has taken a Z-Pak in the past without issues.  Results Labs CBC (05/2024): Within normal limits  Radiology Chest X-ray (08/2023): No definite evidence of active disease Assessment and Plan Acute bronchitis Persistent cough with difficulty breathing and decreased appetite. Improved oxygen saturation. Differential includes bacterial infection. Discussed potential need for chest x-ray if symptoms persist. - Prescribed Z-Pak (azithromycin ). - Prescribed Tessalon  Perles for cough. - Prescribed  prednisone  taper starting the day after the visit. - Advised to continue Symbicort . - Consider chest x-ray and pulmonologist referral if no improvement. patient agrees with plan.   Asthma  Mild asthma with occasional Symbicort  use. Albuterol  ineffective. - Continue Symbicort  once daily in the morning. - Monitor and adjust treatment as necessary.  History of breast cancer Breast cancer treated with Anastrozole  for almost five years. No symptoms of recurrence. Discussed treatment duration variability.    The 10-year ASCVD risk score (Arnett DK, et al., 2019) is: 14%  Past Medical History:  Diagnosis Date   Allergy     Latex   Arthritis    Asthma    several years ago   Breast cancer (HCC) 09/04/2019   Cancer [facility]) 1971 & 2021   [employer]reesia 05/11/2020   Family history of breast cancer    Family history of melanoma    Family history of ovarian cancer    History of melanoma    Melanoma (HCC)    PONV (postoperative nausea and vomiting)    Right middle lobe syndrome    bronchiectasis with possible MAC (seen on CXR)   Skin cancer 1975   Past Surgical History:  Procedure Laterality Date   BREAST SURGERY N/A February 2021   [employer]reesia 05/11/2020   JOINT REPLACEMENT N/A Hips 2003 & 2006   [employer]reesia 05/11/2020   RADIOACTIVE SEED GUIDED PARTIAL MASTECTOMY WITH AXILLARY SENTINEL LYMPH NODE BIOPSY Right 09/27/2019   Procedure: RIGHT RADIOACTIVE SEED GUIDED LUMPECTOMY WITH  SENTINEL LYMPH NODE BIOPSY;  Surgeon: Vernetta Berg, MD;  Location: [facility] OR;  Service: General;  Laterality: Right;  LMA   TONSILLECTOMY     TOTAL HIP ARTHROPLASTY  2003 and 2006   Bilateral   Social History[1] Family History  Problem Relation Age of Onset   Heart disease Father    Melanoma Father        dx. 60s   Diabetes Father    Breast cancer Mother        bilateral, dx. mid-50s   Melanoma Mother        dx. 92s   Arthritis Mother    Cancer Mother    Hearing loss Mother    Miscarriages / Stillbirths Mother     Vision loss Mother    Varicose Veins Mother    Melanoma Sister        dx. 40s   Breast cancer Maternal Aunt        dx. >50   Ovarian cancer Maternal Aunt        dx. >50   Allergies[2]  Review of Systems  Respiratory:  Positive for cough.       Objective:    BP 138/80   Pulse 97   Temp 98.6 F (37 C)   Ht 5' 3 (1.6 m)   Wt 140 lb (63.5 kg)   SpO2 95%   BMI 24.80 kg/m  BP Readings from Last 3 Encounters:  08/07/24 138/80  07/18/24 130/82  05/30/24 128/70   Wt Readings from Last 3 Encounters:  08/07/24 140 lb (63.5 kg)  07/18/24 148 lb 3.2 oz (67.2 kg)  05/30/24 152 lb 3.2 oz (  69 kg)    Physical Exam Vitals and nursing note reviewed.  Constitutional:      General: She is not in acute distress.    Appearance: Normal appearance.  HENT:     Head: Normocephalic.     Right Ear: Tympanic membrane, ear canal and external ear normal.     Left Ear: Tympanic membrane, ear canal and external ear normal.  Eyes:     Extraocular Movements: Extraocular movements intact.     Pupils: Pupils are equal, round, and reactive to light.  Cardiovascular:     Rate and Rhythm: Normal rate and regular rhythm.     Heart sounds: Normal heart sounds.  Pulmonary:     Effort: Pulmonary effort is normal.     Breath sounds: Normal breath sounds.  Musculoskeletal:     Right lower leg: No edema.     Left lower leg: No edema.  Neurological:     General: No focal deficit present.     Mental Status: She is alert and oriented to person, place, and time.  Psychiatric:        Mood and Affect: Mood normal.        Behavior: Behavior normal.        Thought Content: Thought content normal.        Judgment: Judgment normal.      No results found for any visits on 08/07/24.          [1]  Social History Tobacco Use   Smoking status: Never   Smokeless tobacco: Never  Vaping Use   Vaping status: Never Used  Substance Use Topics   Alcohol use: Yes    Alcohol/week: 3.0 - 4.0  standard drinks of alcohol    Types: 3 - 4 Glasses of wine per week    Comment: Socially   Drug use: No  [2]  Allergies Allergen Reactions   Latex Other (See Comments), Rash and Dermatitis   "

## 2024-09-08 ENCOUNTER — Telehealth: Payer: Self-pay

## 2024-09-08 ENCOUNTER — Telehealth: Payer: Self-pay | Admitting: Internal Medicine

## 2024-09-08 NOTE — Telephone Encounter (Signed)
 Copied from CRM [id number]. Topic: General - Other >> Sep 08, 2024 11:41 AM Tonda B wrote: Reason for CRM: patient is calling has question about getting a cat scan please call pt back  [phone]   [phone]

## 2024-09-08 NOTE — Telephone Encounter (Unsigned)
 Copied from CRM [id number]. Topic: Clinical - Refused Triage >> Sep 08, 2024 11:13 AM Joesph PARAS wrote: Patient/caller voiced complaints of a worsening cough. Declined transfer to triage. States will call back if another provider cannot assist her. Declined appointment.

## 2024-09-11 ENCOUNTER — Ambulatory Visit: Admitting: Family Medicine

## 2024-09-21 ENCOUNTER — Ambulatory Visit

## 2024-10-05 ENCOUNTER — Ambulatory Visit: Admitting: Hematology and Oncology

## 2025-05-24 ENCOUNTER — Other Ambulatory Visit

## 2025-05-31 ENCOUNTER — Encounter: Admitting: Family Medicine
